# Patient Record
Sex: Male | Born: 2000 | Race: White | Hispanic: No | Marital: Single
Health system: Southern US, Community
[De-identification: ages and names within clinical notes are randomized; demographics above are authoritative.]

## PROBLEM LIST (undated history)

## (undated) ENCOUNTER — Ambulatory Visit (HOSPITAL_COMMUNITY): Admission: EM | Payer: BC Managed Care – PPO

## (undated) DIAGNOSIS — I639 Cerebral infarction, unspecified: Secondary | ICD-10-CM

## (undated) DIAGNOSIS — S069XAA Unspecified intracranial injury with loss of consciousness status unknown, initial encounter: Secondary | ICD-10-CM

## (undated) HISTORY — PX: EYE SURGERY: SHX253

---

## 2020-08-26 ENCOUNTER — Encounter (HOSPITAL_COMMUNITY): Payer: Self-pay

## 2020-08-26 ENCOUNTER — Other Ambulatory Visit: Payer: Self-pay

## 2020-08-26 ENCOUNTER — Emergency Department (HOSPITAL_COMMUNITY)
Admission: EM | Admit: 2020-08-26 | Discharge: 2020-08-26 | Disposition: A | Discharge status: Home / Self Care | Payer: BC Managed Care – PPO | Attending: Emergency Medicine | Admitting: Emergency Medicine

## 2020-08-26 DIAGNOSIS — R55 Syncope and collapse: Secondary | ICD-10-CM

## 2020-08-26 DIAGNOSIS — Y92002 Bathroom of unspecified non-institutional (private) residence single-family (private) house as the place of occurrence of the external cause: Secondary | ICD-10-CM | POA: Diagnosis not present

## 2020-08-26 DIAGNOSIS — F419 Anxiety disorder, unspecified: Secondary | ICD-10-CM | POA: Diagnosis not present

## 2020-08-26 DIAGNOSIS — W0110XA Fall on same level from slipping, tripping and stumbling with subsequent striking against unspecified object, initial encounter: Secondary | ICD-10-CM | POA: Diagnosis not present

## 2020-08-26 DIAGNOSIS — S0990XA Unspecified injury of head, initial encounter: Secondary | ICD-10-CM | POA: Insufficient documentation

## 2020-08-26 LAB — URINALYSIS, ROUTINE W REFLEX MICROSCOPIC
Bacteria, UA: NONE SEEN
Bilirubin Urine: NEGATIVE
Glucose, UA: NEGATIVE mg/dL
Hgb urine dipstick: NEGATIVE
Ketones, ur: NEGATIVE mg/dL
Leukocytes,Ua: NEGATIVE
Nitrite: NEGATIVE
Protein, ur: 30 mg/dL — AB
Specific Gravity, Urine: 1.026 (ref 1.005–1.030)
pH: 5 (ref 5.0–8.0)

## 2020-08-26 LAB — CBC WITH DIFFERENTIAL/PLATELET
Abs Immature Granulocytes: 0.03 10*3/uL (ref 0.00–0.07)
Basophils Absolute: 0 10*3/uL (ref 0.0–0.1)
Basophils Relative: 0 %
Eosinophils Absolute: 0.1 10*3/uL (ref 0.0–0.5)
Eosinophils Relative: 1 %
HCT: 46.6 % (ref 39.0–52.0)
Hemoglobin: 15.5 g/dL (ref 13.0–17.0)
Immature Granulocytes: 0 %
Lymphocytes Relative: 17 %
Lymphs Abs: 1.9 10*3/uL (ref 0.7–4.0)
MCH: 30 pg (ref 26.0–34.0)
MCHC: 33.3 g/dL (ref 30.0–36.0)
MCV: 90.3 fL (ref 80.0–100.0)
Monocytes Absolute: 1.2 10*3/uL — ABNORMAL HIGH (ref 0.1–1.0)
Monocytes Relative: 11 %
Neutro Abs: 8 10*3/uL — ABNORMAL HIGH (ref 1.7–7.7)
Neutrophils Relative %: 71 %
Platelets: 226 10*3/uL (ref 150–400)
RBC: 5.16 MIL/uL (ref 4.22–5.81)
RDW: 12.4 % (ref 11.5–15.5)
WBC: 11.2 10*3/uL — ABNORMAL HIGH (ref 4.0–10.5)
nRBC: 0 % (ref 0.0–0.2)

## 2020-08-26 LAB — COMPREHENSIVE METABOLIC PANEL
ALT: 15 U/L (ref 0–44)
AST: 22 U/L (ref 15–41)
Albumin: 4.4 g/dL (ref 3.5–5.0)
Alkaline Phosphatase: 92 U/L (ref 38–126)
Anion gap: 10 (ref 5–15)
BUN: 16 mg/dL (ref 6–20)
CO2: 26 mmol/L (ref 22–32)
Calcium: 9.4 mg/dL (ref 8.9–10.3)
Chloride: 102 mmol/L (ref 98–111)
Creatinine, Ser: 1.09 mg/dL (ref 0.61–1.24)
GFR, Estimated: >60 mL/min (ref >60)
Glucose, Bld: 108 mg/dL — ABNORMAL HIGH (ref 70–99)
Potassium: 4.3 mmol/L (ref 3.5–5.1)
Sodium: 138 mmol/L (ref 135–145)
Total Bilirubin: 0.8 mg/dL (ref 0.3–1.2)
Total Protein: 7.9 g/dL (ref 6.5–8.1)

## 2020-08-26 LAB — CBG MONITORING, ED: Glucose-Capillary: 101 mg/dL — ABNORMAL HIGH (ref 70–99)

## 2020-08-26 NOTE — ED Provider Notes (Signed)
[facility]-EMERGENCY DEPT Provider Note   CSN: [id number] Arrival date & time: 08/26/20  0054     History No chief complaint on file.   Jason [name] is a 20 y.o. male with no significant past medical history who presents the emergency department by EMS with a chief complaint of syncope.  The patient reports that he found out earlier today that his girlfriend was treated for an STD several years ago.  After having intercourse tonight, the patient went to the restroom to void and felt some burning with urination.  He then strained to try and have a bowel movement when he had a syncopal episode while he was on the toilet.  He states that he woke up on the floor and believes that he hit his head on an unknown object as he is having pain into the top of his head.  He denies dizziness, lightheadedness, nausea, vomiting, diarrhea, abdominal pain, neck pain, chest pain, shortness of breath, numbness, weakness, slurred speech.  No history of similar.  He reports that the episode occurred earlier today.  He took some ibuprofen for a headache after the episode and reports that the headache is since resolved.  He reports that he has been feeling very nervous since he found out about his partners previous history of an STD on a.  He is requesting to be checked for STDs today since he did have some burning with urination earlier today.  He denies penile or testicular pain or swelling, penile discharge, flank pain, groin pain or swelling, back pain, or abdominal pain.  He denies alcohol use or illicit or recreational substance use.  No family history of sudden cardiac death.  Patient has no chronic medical conditions.  No daily medications.  He reports good p.o. intake today.  The history is provided by the patient and medical records. No language interpreter was used.       History reviewed. No pertinent past medical history.  There are no problems to display for this  patient.   History reviewed. No pertinent surgical history.     No family history on file.  Social History   Tobacco Use  . Smoking status: Never Smoker  . Smokeless tobacco: Never Used  Substance Use Topics  . Alcohol use: Never  . Drug use: Never    Home Medications Prior to Admission medications   Not on File    Allergies    Patient has no known allergies.  Review of Systems   Review of Systems  Constitutional: Negative for appetite change and fever.  HENT: Negative for congestion and sore throat.   Respiratory: Negative for shortness of breath and wheezing.   Cardiovascular: Negative for chest pain, palpitations and leg swelling.  Gastrointestinal: Negative for abdominal pain, constipation, diarrhea, nausea and vomiting.  Genitourinary: Positive for dysuria. Negative for hematuria and urgency.  Musculoskeletal: Negative for back pain, myalgias, neck pain and neck stiffness.  Skin: Negative for rash and wound.  Allergic/Immunologic: Negative for immunocompromised state.  Neurological: Positive for syncope and headaches. Negative for seizures, weakness, light-headedness and numbness.  Psychiatric/Behavioral: Negative for confusion.    Physical Exam Updated Vital Signs BP 118/65 (BP Location: Left Arm)   Pulse (!) 53   Temp 98.6 F (37 C) (Oral)   Resp 20   Ht 5\' 11"  (1.803 m)   Wt 83.9 kg   SpO2 96%   BMI 25.80 kg/m   Physical Exam Vitals and nursing note reviewed.  Constitutional:  General: He is not in acute distress.    Appearance: He is well-developed. He is not ill-appearing, toxic-appearing or diaphoretic.  HENT:     Head: Normocephalic.     Comments: No facial tenderness.  Minimal tenderness to palpation to the superior scalp.  No hematomas, lacerations, or superficial abrasions.    Nose: Nose normal.  Eyes:     Extraocular Movements: Extraocular movements intact.     Conjunctiva/sclera: Conjunctivae normal.     Pupils: Pupils are equal,  round, and reactive to light.  Cardiovascular:     Rate and Rhythm: Normal rate and regular rhythm.     Heart sounds: No murmur heard. No friction rub. No gallop.   Pulmonary:     Effort: Pulmonary effort is normal. No respiratory distress.     Breath sounds: No stridor. No wheezing, rhonchi or rales.  Chest:     Chest wall: No tenderness.  Abdominal:     General: There is no distension.     Palpations: Abdomen is soft. There is no mass.     Tenderness: There is no abdominal tenderness. There is no right CVA tenderness, left CVA tenderness, guarding or rebound.     Hernia: No hernia is present.  Musculoskeletal:     Cervical back: Neck supple.  Skin:    General: Skin is warm and dry.     Capillary Refill: Capillary refill takes less than 2 seconds.     Coloration: Skin is not pale.  Neurological:     Mental Status: He is alert.     Comments: Mental Status: Patient is awake, alert, oriented to person, place, month, year, and situation. Patient is able to give a clear and coherent history. Cranial Nerves: II: Visual Fields are full. Pupils are equal, round, and reactive to light. III,IV, VI: EOMI without ptosis or diploplia.  V: Facial sensation is symmetric to temperature VII: Facial movement is symmetric.  VIII: hearing is intact to voice X: Uvula elevates symmetrically XI: Shoulder shrug is symmetric. XII: tongue is midline without atrophy or fasciculations.  Motor: Tone is normal. Bulk is normal. 5/5 strength was present in all four extremities.  Sensory: Sensation is symmetric to light touch and temperature in the arms and legs. Cerebellar: FNF intact bilaterally   Psychiatric:        Mood and Affect: Mood is anxious.        Behavior: Behavior normal.     Comments: Patient is very anxious appearing     ED Results / Procedures / Treatments   Labs (all labs ordered are listed, but only abnormal results are displayed) Labs Reviewed  CBC WITH DIFFERENTIAL/PLATELET -  Abnormal; Notable for the following components:      Result Value   WBC 11.2 (*)    Neutro Abs 8.0 (*)    Monocytes Absolute 1.2 (*)    All other components within normal limits  COMPREHENSIVE METABOLIC PANEL - Abnormal; Notable for the following components:   Glucose, Bld 108 (*)    All other components within normal limits  URINALYSIS, ROUTINE W REFLEX MICROSCOPIC - Abnormal; Notable for the following components:   Protein, ur 30 (*)    All other components within normal limits  CBG MONITORING, ED - Abnormal; Notable for the following components:   Glucose-Capillary 101 (*)    All other components within normal limits  GC/CHLAMYDIA PROBE AMP ([place]) NOT AT [facility]    EKG EKG Interpretation  Date/Time:  Monday August 26 2020 01:38:42 EST  Ventricular Rate:  68 PR Interval:    QRS Duration: 72 QT Interval:  355 QTC Calculation: 378 R Axis:   43 Text Interpretation: Sinus rhythm Borderline T wave abnormalities Confirmed by Nicanor Alcon, April ([id number]) on 08/26/2020 1:44:46 AM   Radiology No results found.  Procedures Procedures   Medications Ordered in ED Medications - No data to display  ED Course  I have reviewed the triage vital signs and the nursing notes.  Pertinent labs & imaging results that were available during my care of the patient were reviewed by me and considered in my medical decision making (see chart for details).    MDM Rules/Calculators/A&P                          20 year old male who presents the emergency department after a syncopal episode while he was on the toilet earlier tonight.  He hit his head on an unknown object.  Notably, patient was very anxious prior to the episode as he developed some dysuria that began after having intercourse with his significant other when he found out earlier that she previously was treated for an STD several years ago.   Differential diagnosis includes seizure-like activity, orthostatic hypotension, vasovagal  syncope, cardiac syncope, neurologic syncope, ACS.  The patient was discussed with Dr. Nicanor Alcon, attending physician.  No focal neurologic deficits.  Patient has now been observed for more than 4 hours since the incident and mental status remains intact.  He has no obvious wounds to the scalp and headache resolved after ibuprofen prior to arrival.  CT head is not indicated at this time.  EKG with sinus rhythm.  No metabolic derangements.  UA is not concerning for infection.  No indication for prophylactic STI testing at this time.  Advised patient that symptoms are most consistent with vasovagal syncope.  Recommended outpatient follow-up as needed.  ER return precautions given.  Patient was advised that he could receive routine STI testing as he go for [facility] department, but urine was not concerning for infection today.  He is hemodynamically stable no acute distress.  Safe for discharge home with outpatient follow-up as needed.   Final Clinical Impression(s) / ED Diagnoses Final diagnoses:  Vasovagal syncope  Injury of head, initial encounter    Rx / DC Orders ED Discharge Orders    None       Barkley Boards, PA-C 08/26/20 0854    Palumbo, April, MD 08/26/20 2305

## 2020-08-26 NOTE — ED Triage Notes (Signed)
Pt sts a syncopal episode tonight while in the bathroom. Pt sts he fell landing on the back of his head and waking up on floor. Denies any medical history or allergies. Ambulatory from triage to room.

## 2020-08-26 NOTE — Discharge Instructions (Signed)
Thank you for allowing me to care for you today in the Emergency Department.   Your work-up was reassuring today.  Your urine was not concerning for infection.  Your electrolytes were reassuring.  No evidence of urinary tract infection.  Your symptoms today were consistent with vasovagal syncope.  You can follow-up with primary care as needed.  If you do not have a primary care provider, you can call the number on your discharge paperwork to get established with 1.  Take 650 mg of Tylenol or 600 mg of ibuprofen with food every 6 hours for pain.  You can alternate between these 2 medications every 3 hours if your pain returns.  For instance, you can take Tylenol at noon, followed by a dose of ibuprofen at 3, followed by second dose of Tylenol and 6.  Return the emergency department if you have another syncopal episode, if you develop uncontrollable vomiting, new numbness or weakness, fever, unable to walk, start having high fevers, severe abdominal pain, fever, unable to urinate, or have other new, concerning symptoms.

## 2020-11-22 ENCOUNTER — Telehealth: Payer: Self-pay

## 2020-11-22 NOTE — Telephone Encounter (Signed)
NOTES ON FILE FROM [facility] [phone], REFERRAL SENT TO SCHEDULING

## 2020-11-22 NOTE — Telephone Encounter (Signed)
ERROR

## 2020-12-11 ENCOUNTER — Ambulatory Visit (HOSPITAL_COMMUNITY)
Admission: EM | Admit: 2020-12-11 | Discharge: 2020-12-11 | Disposition: A | Discharge status: Home / Self Care | Payer: BC Managed Care – PPO | Attending: Family Medicine | Admitting: Family Medicine

## 2020-12-11 ENCOUNTER — Encounter (HOSPITAL_COMMUNITY): Payer: Self-pay

## 2020-12-11 ENCOUNTER — Other Ambulatory Visit: Payer: Self-pay

## 2020-12-11 DIAGNOSIS — R21 Rash and other nonspecific skin eruption: Secondary | ICD-10-CM | POA: Diagnosis not present

## 2020-12-11 MED ORDER — VALACYCLOVIR HCL 1 G PO TABS: 1000.0000 mg | ORAL_TABLET | Freq: Three times a day (TID) | ORAL | 0 refills | Status: DC

## 2020-12-11 NOTE — ED Triage Notes (Signed)
Pt reports woke up with a few itchy bumps on penis yesterday, and bumps are now more numerous. Pt reports some swelling as well.

## 2020-12-11 NOTE — ED Provider Notes (Signed)
  [facility]ARE [facility]   [id number] 12/11/20 Arrival Time: 1458  ASSESSMENT & PLAN:  1. Penile rash    Discussed suspicion for genital herpes. Too early to send for culture. Declines other STD testing.  Begin: Meds ordered this encounter  Medications  . valACYclovir (VALTREX) 1000 MG tablet    Sig: Take 1 tablet (1,000 mg total) by mouth 3 (three) times daily.    Dispense:  21 tablet    Refill:  0     Follow-up Information    Ann Maki, MD.   Specialty: Family Medicine Why: As needed. Contact information: [address] [place] [address] [phone]              Reviewed expectations re: course of current medical issues. Questions answered. Outlined signs and symptoms indicating need for more acute intervention. Patient verbalized understanding. After Visit Summary given.   SUBJECTIVE:  Jason [name] is a 20 y.o. male who presents with complaint of "bumps on penis"; noted over past two days; with pain/discomfort/itching. No h/o similar. Sexually active with one male partner. Normal urination. No penile discharge.   OBJECTIVE:  Vitals:   12/11/20 1509  BP: 120/73  Pulse: 74  Resp: 18  Temp: 98.9 F (37.2 C)  TempSrc: Oral  SpO2: 98%    General appearance: alert, cooperative, appears stated age and no distress Throat: lips, mucosa, and tongue normal; teeth and gums normal Lungs: unlabored respirations; speaks full sentences without difficulty Back: no CVA tenderness; FROM at waist Abdomen: soft, non-tender GU: cluster of vesicles on distal penis at glans without ulceration; tender to touch Skin: warm and dry Psychological: alert and cooperative; normal mood and affect.  No Known Allergies  History reviewed. No pertinent past medical history. Family History  Problem Relation Age of Onset  . Healthy Mother   . Healthy Father    Social History   Socioeconomic History  . Marital status: Single    Spouse name:  Not on file  . Number of children: Not on file  . Years of education: Not on file  . Highest education level: Not on file  Occupational History  . Not on file  Tobacco Use  . Smoking status: Never Smoker  . Smokeless tobacco: Never Used  Substance and Sexual Activity  . Alcohol use: Never  . Drug use: Never  . Sexual activity: Not on file  Other Topics Concern  . Not on file  Social History Narrative  . Not on file   Social Determinants of Health   Financial Resource Strain: Not on file  Food Insecurity: Not on file  Transportation Needs: Not on file  Physical Activity: Not on file  Stress: Not on file  Social Connections: Not on file  Intimate Partner Violence: Not on file          Mardella Layman, MD 12/11/20 1630

## 2020-12-17 ENCOUNTER — Telehealth: Payer: Self-pay

## 2020-12-17 NOTE — Telephone Encounter (Signed)
Progress notes on file sent from: [facility], phone: [phone]. Copy of referral order have been sent to scheduling.

## 2021-02-03 ENCOUNTER — Telehealth: Payer: Self-pay

## 2021-02-03 NOTE — Telephone Encounter (Signed)
Referral notes sent from [facility], Phone #: [phone], Fax #: [phone]   Notes sent to scheduling

## 2021-03-03 ENCOUNTER — Other Ambulatory Visit: Payer: Self-pay

## 2021-03-03 ENCOUNTER — Encounter (HOSPITAL_COMMUNITY): Payer: Self-pay

## 2021-03-03 ENCOUNTER — Ambulatory Visit (HOSPITAL_COMMUNITY): Payer: BC Managed Care – PPO

## 2021-03-03 ENCOUNTER — Ambulatory Visit (HOSPITAL_COMMUNITY)
Admission: EM | Admit: 2021-03-03 | Discharge: 2021-03-03 | Disposition: A | Discharge status: Home / Self Care | Payer: BC Managed Care – PPO | Attending: Physician Assistant | Admitting: Physician Assistant

## 2021-03-03 ENCOUNTER — Ambulatory Visit (INDEPENDENT_AMBULATORY_CARE_PROVIDER_SITE_OTHER): Payer: BC Managed Care – PPO

## 2021-03-03 DIAGNOSIS — R079 Chest pain, unspecified: Secondary | ICD-10-CM | POA: Diagnosis not present

## 2021-03-03 DIAGNOSIS — R0789 Other chest pain: Secondary | ICD-10-CM | POA: Diagnosis not present

## 2021-03-03 IMAGING — DX DG CHEST 2V
3 series · 3 of 3 positions shown · non-contrast
Comparison: None.

CLINICAL DATA: Chest pain.

EXAM:
CHEST - 2 VIEW

[chest pa (1 of 2)]
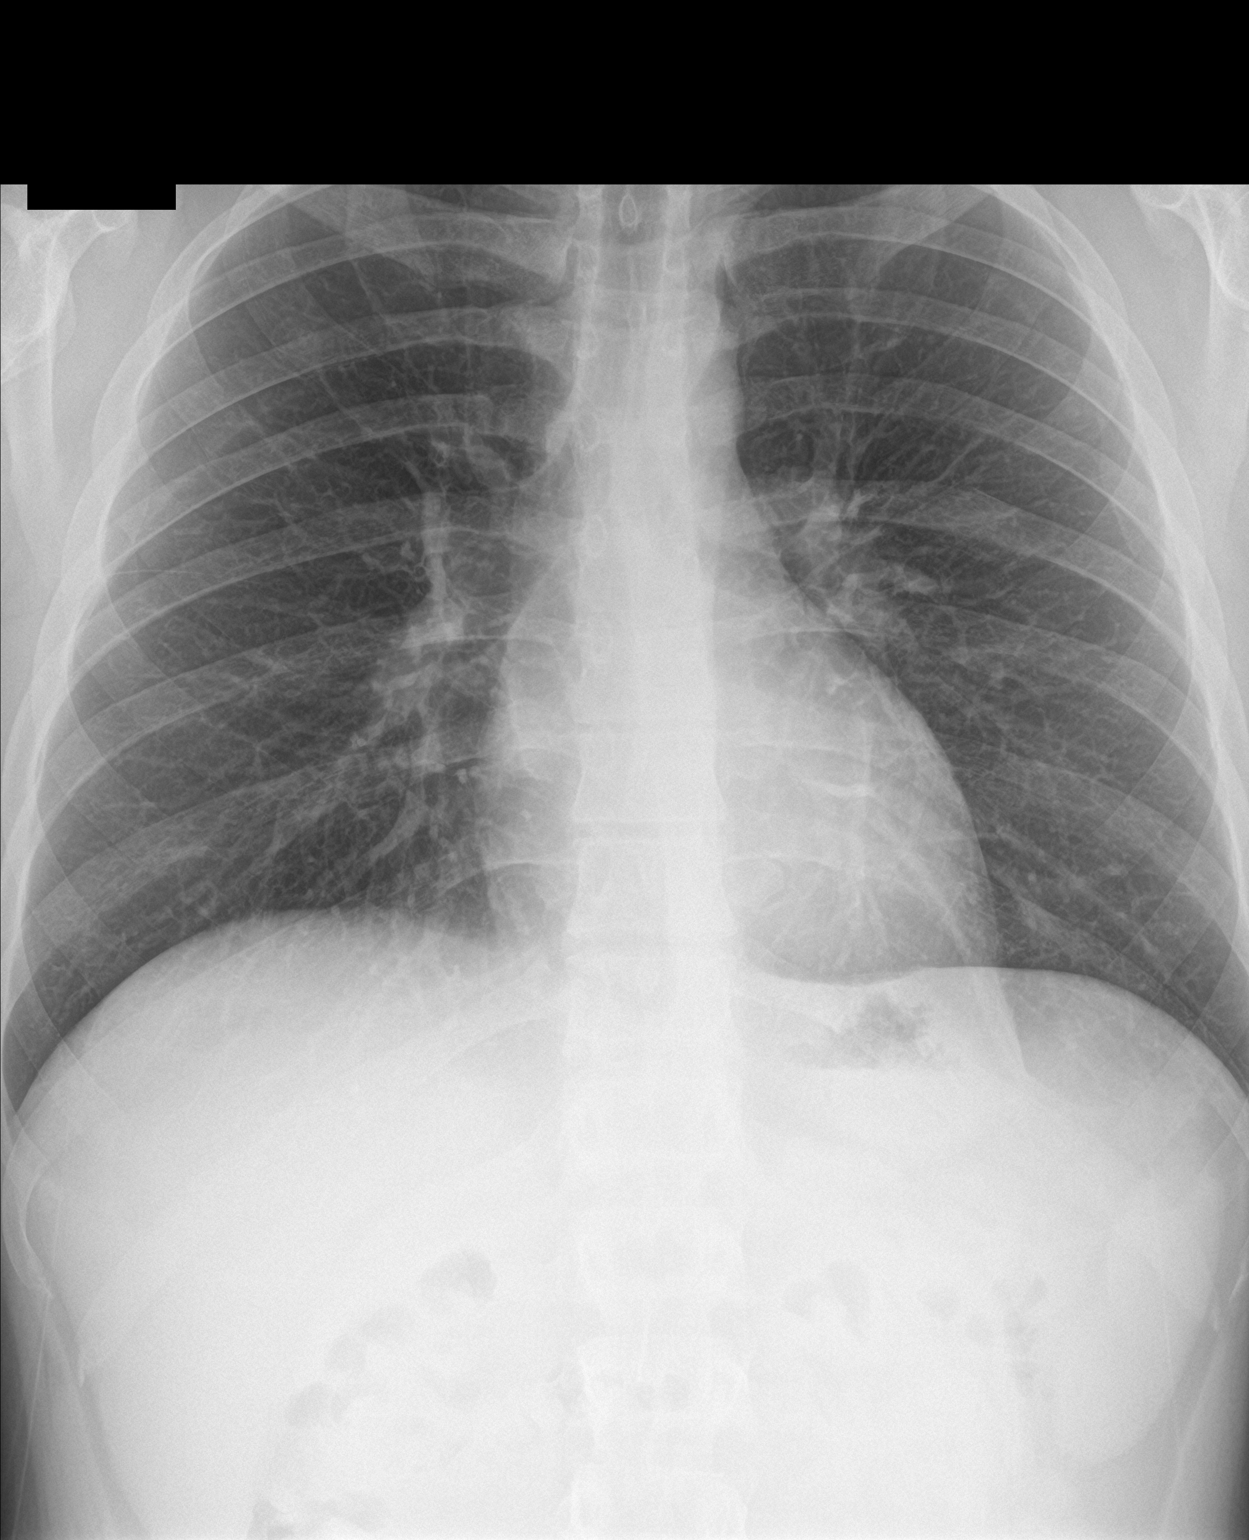

[chest lat]
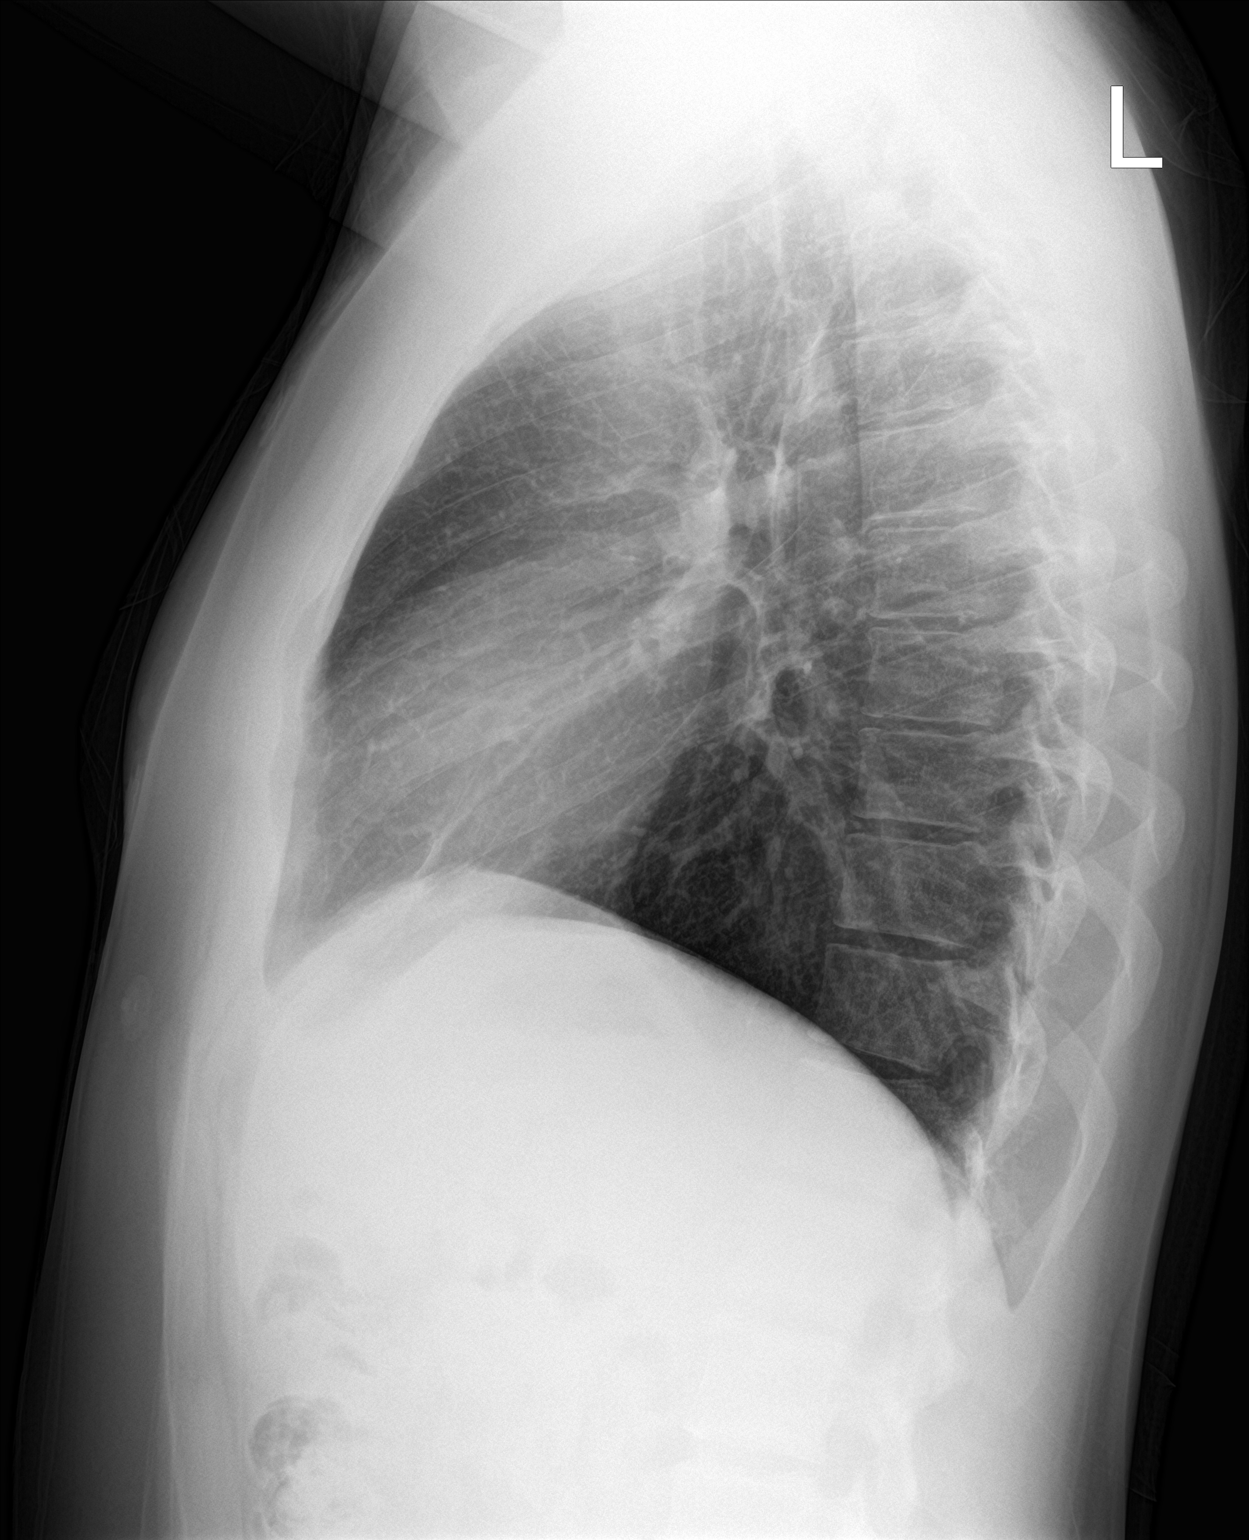

[chest pa (2 of 2)]
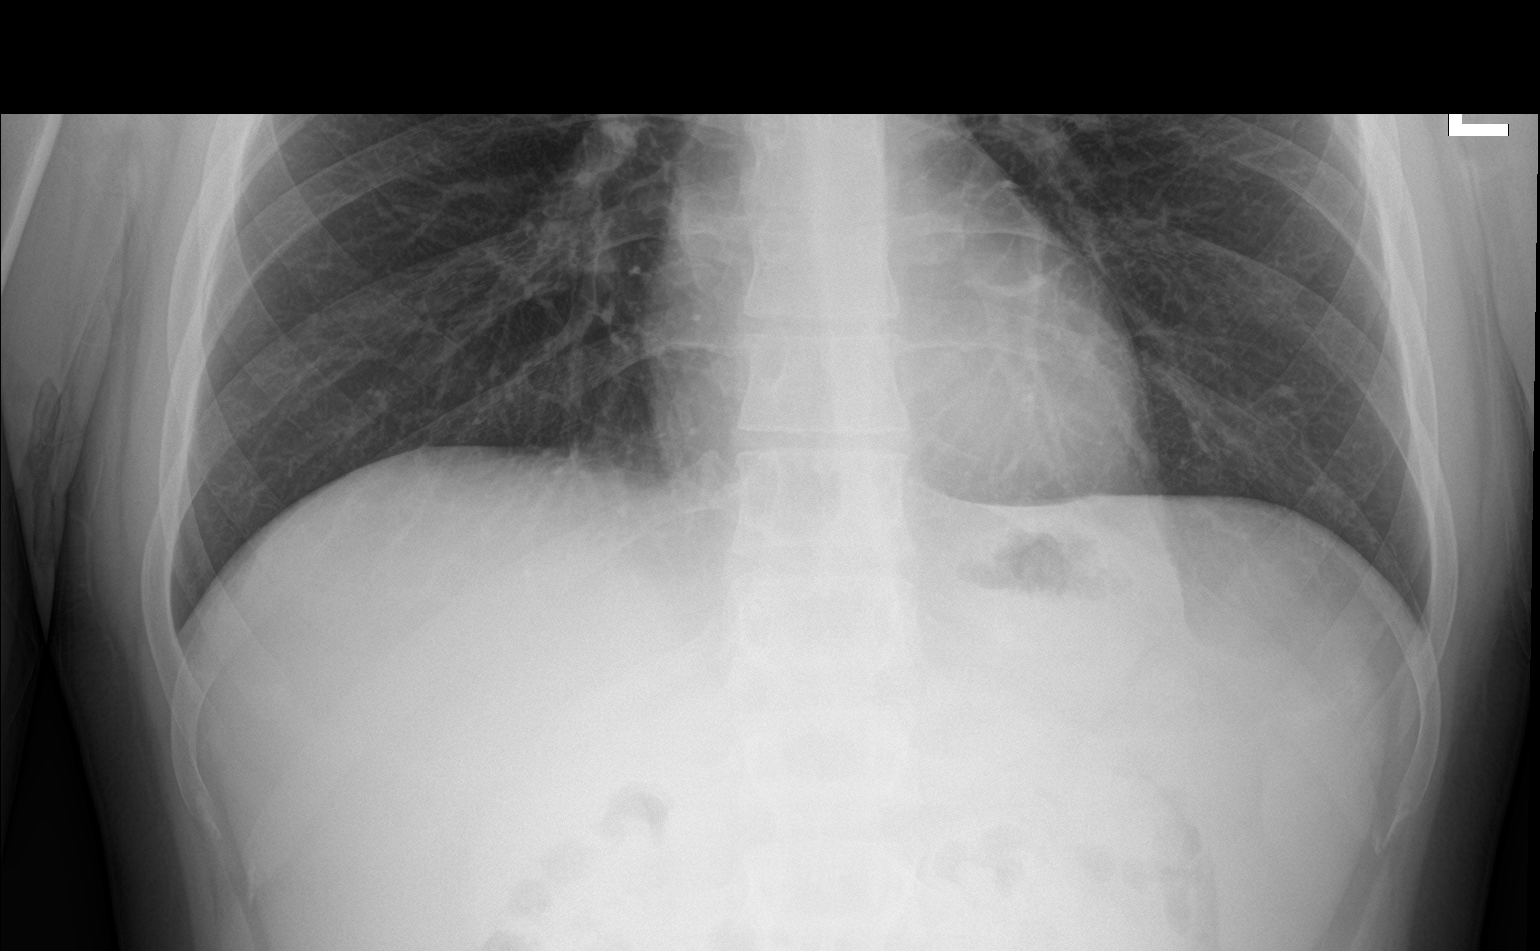

[3 of 3 positions shown; findings below may reference images not displayed]

FINDINGS: The heart size and mediastinal contours are within normal limits.
Both lungs are clear. The visualized skeletal structures are
unremarkable.
IMPRESSION: No acute cardiopulmonary process.

## 2021-03-03 MED ORDER — PREDNISONE 20 MG PO TABS: 40.0000 mg | ORAL_TABLET | Freq: Every day | ORAL | 0 refills | Status: AC

## 2021-03-03 MED ORDER — TIZANIDINE HCL 4 MG PO CAPS: 4.0000 mg | ORAL_CAPSULE | Freq: Every evening | ORAL | 0 refills | Status: DC | PRN

## 2021-03-03 NOTE — ED Provider Notes (Signed)
[facility]    CSN: [id number] Arrival date & time: 03/03/21  1533      History   Chief Complaint Chief Complaint  Patient presents with   Chest Pain    HPI Jason [name] is a 20 y.o. male.   Patient presents today with a several hour history of chest pain.  Reports that he woke up this afternoon and had severe chest pain.  This is gradually been improving but continues to be intermittent without identifiable trigger.  He denies any change in intensity pain with position.  Reports pain is rated 1/2 on a 0-10 pain scale, localized to substernal region with radiation into right anterior chest, described as sharp, worse with movement, no alleviating factors identified.  He denies any change in activity or injury prior to symptom onset.  He has not tried any over-the-counter medications.  Denies any shortness of breath, lightheadedness, dizziness, nausea, vomiting, diaphoresis.  He denies any significant past medical history including diabetes, hypertension, hyperlipidemia, personal or family history of cardiovascular disease.  He does vape on a regular basis but denies tobacco smoke.   History reviewed. No pertinent past medical history.  There are no problems to display for this patient.   History reviewed. No pertinent surgical history.     Home Medications    Prior to Admission medications   Medication Sig Start Date End Date Taking? Authorizing Provider  predniSONE (DELTASONE) 20 MG tablet Take 2 tablets (40 mg total) by mouth daily for 4 days. 03/03/21 03/07/21 Yes Krystalyn Kubota K, PA-C  tiZANidine (ZANAFLEX) 4 MG capsule Take 1 capsule (4 mg total) by mouth at bedtime as needed for muscle spasms. 03/03/21  Yes Nova Schmuhl, Noberto Retort, PA-C  valACYclovir (VALTREX) 1000 MG tablet Take 1 tablet (1,000 mg total) by mouth 3 (three) times daily. 12/11/20   Mardella Layman, MD    Family History Family History  Problem Relation Age of Onset   Healthy Mother    Healthy Father      Social History Social History   Tobacco Use   Smoking status: Never   Smokeless tobacco: Never  Vaping Use   Vaping Use: Every day  Substance Use Topics   Alcohol use: Never   Drug use: Never     Allergies   Patient has no known allergies.   Review of Systems Review of Systems  Constitutional:  Positive for activity change. Negative for appetite change, fatigue and fever.  Respiratory:  Negative for cough and shortness of breath.   Cardiovascular:  Positive for chest pain. Negative for palpitations and leg swelling.  Gastrointestinal:  Negative for abdominal pain, diarrhea, nausea and vomiting.  Neurological:  Negative for dizziness, light-headedness and headaches.    Physical Exam Triage Vital Signs ED Triage Vitals  Enc Vitals Group     BP 03/03/21 1559 (!) 129/116     Pulse Rate 03/03/21 1559 61     Resp 03/03/21 1607 19     Temp 03/03/21 1607 99 F (37.2 C)     Temp Source 03/03/21 1607 Oral     SpO2 03/03/21 1559 99 %     Weight --      Height --      Head Circumference --      Peak Flow --      Pain Score 03/03/21 1559 1     Pain Loc --      Pain Edu? --      Excl. in GC? --  No data found.  Updated Vital Signs BP 110/76 (BP Location: Right Arm)   Pulse 61   Temp 99 F (37.2 C) (Oral)   Resp 19   SpO2 99%   Visual Acuity Right Eye Distance:   Left Eye Distance:   Bilateral Distance:    Right Eye Near:   Left Eye Near:    Bilateral Near:     Physical Exam Vitals reviewed.  Constitutional:      General: He is awake.     Appearance: Normal appearance. He is normal weight. He is not ill-appearing.     Comments: Very pleasant male appears stated age in no acute distress sitting comfortably in exam room  HENT:     Head: Normocephalic and atraumatic.  Cardiovascular:     Rate and Rhythm: Normal rate and regular rhythm.     Heart sounds: Normal heart sounds, S1 normal and S2 normal. No murmur heard. Pulmonary:     Effort: Pulmonary  effort is normal.     Breath sounds: Normal breath sounds. No stridor. No wheezing, rhonchi or rales.     Comments: Clear to auscultation bilaterally Chest:     Chest wall: Tenderness present. No deformity or swelling.     Comments: Pain reproducible on exam.  Tenderness to palpation over sternum and right anterior rib cage. Abdominal:     General: Bowel sounds are normal.     Palpations: Abdomen is soft.     Tenderness: There is no abdominal tenderness.  Neurological:     Mental Status: He is alert.  Psychiatric:        Behavior: Behavior is cooperative.     UC Treatments / Results  Labs (all labs ordered are listed, but only abnormal results are displayed) Labs Reviewed - No data to display  EKG   Radiology DG Chest 2 View  Result Date: 03/03/2021 CLINICAL DATA:  Chest pain. EXAM: CHEST - 2 VIEW COMPARISON:  None. FINDINGS: The heart size and mediastinal contours are within normal limits. Both lungs are clear. The visualized skeletal structures are unremarkable. IMPRESSION: No acute cardiopulmonary process. Electronically Signed   By: Genevive Bi M.D.   On: 03/03/2021 16:51    Procedures Procedures (including critical care time)  Medications Ordered in UC Medications - No data to display  Initial Impression / Assessment and Plan / UC Course  I have reviewed the triage vital signs and the nursing notes.  Pertinent labs & imaging results that were available during my care of the patient were reviewed by me and considered in my medical decision making (see chart for details).      EKG obtained showed normal sinus rhythm with first-degree AV block with ventricular rate of 65 bpm and there are with progression compared to 08/26/2020 tracing; no ischemic changes.  Chest x-ray obtained showed no acute abnormalities.  Discussed symptoms are likely musculoskeletal in etiology given tenderness on exam.  Patient was started on prednisone burst (40 mg for 4 days) with  instruction not to take NSAIDs.  Was prescribed Zanaflex to be used at night with instruction not to drive or drink alcohol taking this medication as drowsiness is a common side effect.  He can use Tylenol for breakthrough pain.  Discussed that if he has any persistent or worsening symptoms he needs to go directly to the emergency room to which he expressed understanding.  Strict return precautions given to which he expressed understanding.  Final Clinical Impressions(s) / UC Diagnoses   Final diagnoses:  Chest wall pain     Discharge Instructions      Your EKG and chest x-ray were essentially normal.  I believe your symptoms are related to muscle strain given you are tender when touched your chest.  Start prednisone 40 mg in the morning for 4 days.  Should not take NSAIDs including aspirin, ibuprofen/Advil, naproxen/Aleve with this medication as it will cause stomach bleeding.  You can use Zanaflex at night.  This will make you sleepy so do not drive or drink alcohol while taking it.  You can use Tylenol for breakthrough pain.  If you have any worsening symptoms or symptoms or not improving as we discussed you need to go directly to the emergency room for further evaluation and management.     ED Prescriptions     Medication Sig Dispense Auth. Provider   predniSONE (DELTASONE) 20 MG tablet Take 2 tablets (40 mg total) by mouth daily for 4 days. 8 tablet Saniyya Gau K, PA-C   tiZANidine (ZANAFLEX) 4 MG capsule Take 1 capsule (4 mg total) by mouth at bedtime as needed for muscle spasms. 10 capsule Terrick Allred, Noberto Retort, PA-C      PDMP not reviewed this encounter.   Jeani Hawking, PA-C 03/03/21 1712

## 2021-03-03 NOTE — Discharge Instructions (Addendum)
Your EKG and chest x-ray were essentially normal.  I believe your symptoms are related to muscle strain given you are tender when touched your chest.  Start prednisone 40 mg in the morning for 4 days.  Should not take NSAIDs including aspirin, ibuprofen/Advil, naproxen/Aleve with this medication as it will cause stomach bleeding.  You can use Zanaflex at night.  This will make you sleepy so do not drive or drink alcohol while taking it.  You can use Tylenol for breakthrough pain.  If you have any worsening symptoms or symptoms or not improving as we discussed you need to go directly to the emergency room for further evaluation and management.

## 2021-03-03 NOTE — ED Triage Notes (Addendum)
Pt in with c/o mid to right sided cp that started when he woke up this afternoon. Pt states he feels cloudy in his head and has been dealing with anxiety and panic attacks lately. Pt states he has been dealing with chest wall pain x 3 weeks now  Pt Denies any radiation to arm or jaw but does have radiation to his back  Pt denies any sob, or nausea or cardiac hx

## 2021-04-24 ENCOUNTER — Ambulatory Visit (HOSPITAL_COMMUNITY)
Admission: EM | Admit: 2021-04-24 | Discharge: 2021-04-24 | Disposition: A | Discharge status: Home / Self Care | Payer: BC Managed Care – PPO | Attending: Emergency Medicine | Admitting: Emergency Medicine

## 2021-04-24 ENCOUNTER — Other Ambulatory Visit: Payer: Self-pay

## 2021-04-24 ENCOUNTER — Encounter (HOSPITAL_COMMUNITY): Payer: Self-pay | Admitting: Emergency Medicine

## 2021-04-24 DIAGNOSIS — J029 Acute pharyngitis, unspecified: Secondary | ICD-10-CM

## 2021-04-24 LAB — POCT RAPID STREP A, ED / UC: Streptococcus, Group A Screen (Direct): NEGATIVE

## 2021-04-24 MED ORDER — VALACYCLOVIR HCL 1 G PO TABS: 1000.0000 mg | ORAL_TABLET | Freq: Three times a day (TID) | ORAL | 1 refills | Status: DC

## 2021-04-24 MED ORDER — LIDOCAINE VISCOUS HCL 2 % MT SOLN
15.0000 mL | OROMUCOSAL | 0 refills | Status: DC | PRN
Start: 2021-04-24 — End: 2023-05-12

## 2021-04-24 NOTE — Discharge Instructions (Addendum)
Your strep test is pending, you will be notified if positive, if positive antibiotics will be sent into the pharmacy  If your testing is negative today then your symptoms are most likely related to a virus and should improve with time, most likely you have been exposed to multiple viruses from different people which is why your symptoms seem to be prolonged  I have prescribed for you a lidocaine solution that you may use every 4 hours as needed for additional comfort, gargle then spit  You may continue use of over-the-counter Tylenol or ibuprofen for additional comfort  You may attempt use of throat lozenges, warm liquids and salt water gargles for additional comfort

## 2021-04-24 NOTE — ED Triage Notes (Signed)
Pt presents with sore throat, productive cough, and congestion xs 7 days.

## 2021-04-24 NOTE — ED Provider Notes (Signed)
[facility]    CSN: [id number] Arrival date & time: 04/24/21  1315      History   Chief Complaint Chief Complaint  Patient presents with   Cough   Sore Throat   Nasal Congestion    HPI Jason [name] is a 20 y.o. male.   Patient presents with sore throat, nasal congestion, productive cough, rhinorrhea for 7 days, endorses that sore throat worsened overnight and became unbearable to swallow and eat this morning.  Was tolerating food and liquids.  Taking over-the-counter medication which did provide some relief.  Endorses fever present day 1 through 3 but has resolved.  Multiple sick contacts.  Denies, body aches, headaches, ear pain, chest pain or tightness, shortness of breath, wheezing, abdominal pain, nausea, vomiting, diarrhea.  No pertinent medical history.   Requesting valacyclovir refill.  Having current outbreak.   Cough Associated symptoms: rhinorrhea and sore throat   Associated symptoms: no ear pain, no shortness of breath and no wheezing   Sore Throat Pertinent negatives include no shortness of breath.  Cough Associated symptoms: rhinorrhea   Associated symptoms: no ear pain, no shortness of breath and no wheezing   Sore Throat Pertinent negatives include no shortness of breath.  Cough Associated symptoms: no ear pain, no shortness of breath and no wheezing   Sore Throat Pertinent negatives include no shortness of breath.  Cough Associated symptoms: no shortness of breath and no wheezing   Sore Throat Pertinent negatives include no shortness of breath.  Cough Associated symptoms: no shortness of breath   Sore Throat Pertinent negatives include no shortness of breath.   History reviewed. No pertinent past medical history.  There are no problems to display for this patient.   History reviewed. No pertinent surgical history.     Home Medications    Prior to Admission medications   Medication Sig Start Date End Date Taking? Authorizing  Provider  lidocaine (XYLOCAINE) 2 % solution Use as directed 15 mLs in the mouth or throat every 4 (four) hours as needed for mouth pain. 04/24/21  Yes Earlyn Sylvan R, NP  tiZANidine (ZANAFLEX) 4 MG capsule Take 1 capsule (4 mg total) by mouth at bedtime as needed for muscle spasms. 03/03/21   Raspet, Noberto Retort, PA-C  valACYclovir (VALTREX) 1000 MG tablet Take 1 tablet (1,000 mg total) by mouth 3 (three) times daily. 04/24/21   Valinda Hoar, NP    Family History Family History  Problem Relation Age of Onset   Healthy Mother    Healthy Father     Social History Social History   Tobacco Use   Smoking status: Never   Smokeless tobacco: Never  Vaping Use   Vaping Use: Every day  Substance Use Topics   Alcohol use: Never   Drug use: Never     Allergies   Patient has no known allergies.   Review of Systems Review of Systems  Constitutional: Negative.   HENT:  Positive for congestion, rhinorrhea and sore throat. Negative for dental problem, drooling, ear discharge, ear pain, facial swelling, hearing loss, mouth sores, nosebleeds, postnasal drip, sinus pressure, sinus pain, sneezing, tinnitus, trouble swallowing and voice change.   Respiratory:  Positive for cough. Negative for apnea, choking, chest tightness, shortness of breath, wheezing and stridor.   Skin: Negative.     Physical Exam Triage Vital Signs ED Triage Vitals  Enc Vitals Group     BP 04/24/21 1503 121/75     Pulse Rate 04/24/21 1503 68  Resp 04/24/21 1503 17     Temp 04/24/21 1503 97.8 F (36.6 C)     Temp Source 04/24/21 1503 Oral     SpO2 04/24/21 1503 98 %     Weight --      Height --      Head Circumference --      Peak Flow --      Pain Score 04/24/21 1502 0     Pain Loc --      Pain Edu? --      Excl. in GC? --    No data found.  Updated Vital Signs BP 121/75 (BP Location: Right Arm)   Pulse 68   Temp 97.8 F (36.6 C) (Oral)   Resp 17   SpO2 98%   Visual Acuity Right Eye  Distance:   Left Eye Distance:   Bilateral Distance:    Right Eye Near:   Left Eye Near:    Bilateral Near:     Physical Exam Constitutional:      Appearance: He is well-developed and normal weight.  HENT:     Right Ear: Tympanic membrane and ear canal normal.     Left Ear: Tympanic membrane and ear canal normal.     Nose: Congestion present. No rhinorrhea.     Mouth/Throat:     Mouth: Mucous membranes are moist.     Pharynx: Posterior oropharyngeal erythema present.     Tonsils: No tonsillar exudate or tonsillar abscesses. 2+ on the right. 2+ on the left.  Cardiovascular:     Rate and Rhythm: Normal rate and regular rhythm.     Heart sounds: Normal heart sounds.  Pulmonary:     Effort: Pulmonary effort is normal.     Breath sounds: Normal breath sounds.  Musculoskeletal:     Cervical back: Normal range of motion and neck supple.  Lymphadenopathy:     Cervical: No cervical adenopathy.  Skin:    General: Skin is warm and dry.  Neurological:     Mental Status: He is alert and oriented to person, place, and time.  Psychiatric:        Mood and Affect: Mood normal.        Behavior: Behavior normal.     UC Treatments / Results  Labs (all labs ordered are listed, but only abnormal results are displayed) Labs Reviewed  POCT RAPID STREP A, ED / UC    EKG   Radiology No results found.  Procedures Procedures (including critical care time)  Medications Ordered in UC Medications - No data to display  Initial Impression / Assessment and Plan / UC Course  I have reviewed the triage vital signs and the nursing notes.  Pertinent labs & imaging results that were available during my care of the patient were reviewed by me and considered in my medical decision making (see chart for details).  Sore throat  Discussed etiology of symptoms most likely viral due to multiple exposures, patient to follow-up in urgent care symptoms worsen or continue to persist  1.  Rapid strep  negative, sent for culture 2.  Lidocaine viscous 2% 15 mils every 4 hours as needed 3.  Can continue use of over-the-counter pain medication 4.  Valacyclovir 1000 mg 3 times daily for 7 days prescribed, patient wanting to be on prophylactic outbreak medication, patient to follow-up with PCP for further evaluation and prescription  Final Clinical Impressions(s) / UC Diagnoses   Final diagnoses:  Sore throat     Discharge  Instructions      Your strep test is pending, you will be notified if positive, if positive antibiotics will be sent into the pharmacy  If your testing is negative today then your symptoms are most likely related to a virus and should improve with time, most likely you have been exposed to multiple viruses from different people which is why your symptoms seem to be prolonged  I have prescribed for you a lidocaine solution that you may use every 4 hours as needed for additional comfort, gargle then spit  You may continue use of over-the-counter Tylenol or ibuprofen for additional comfort  You may attempt use of throat lozenges, warm liquids and salt water gargles for additional comfort   ED Prescriptions     Medication Sig Dispense Auth. Provider   valACYclovir (VALTREX) 1000 MG tablet Take 1 tablet (1,000 mg total) by mouth 3 (three) times daily. 21 tablet Sharilyn Geisinger R, NP   lidocaine (XYLOCAINE) 2 % solution Use as directed 15 mLs in the mouth or throat every 4 (four) hours as needed for mouth pain. 100 mL Valinda Hoar, NP      PDMP not reviewed this encounter.   Valinda Hoar, NP 04/24/21 1558

## 2021-05-14 ENCOUNTER — Other Ambulatory Visit: Payer: Self-pay

## 2021-05-14 ENCOUNTER — Encounter (HOSPITAL_COMMUNITY): Payer: Self-pay

## 2021-05-14 ENCOUNTER — Emergency Department (HOSPITAL_COMMUNITY)
Admission: EM | Admit: 2021-05-14 | Discharge: 2021-05-15 | Disposition: A | Discharge status: Home / Self Care | Payer: BC Managed Care – PPO | Attending: Emergency Medicine | Admitting: Emergency Medicine

## 2021-05-14 DIAGNOSIS — J101 Influenza due to other identified influenza virus with other respiratory manifestations: Secondary | ICD-10-CM | POA: Diagnosis not present

## 2021-05-14 DIAGNOSIS — Z28310 Unvaccinated for covid-19: Secondary | ICD-10-CM | POA: Diagnosis not present

## 2021-05-14 DIAGNOSIS — Z20822 Contact with and (suspected) exposure to covid-19: Secondary | ICD-10-CM | POA: Insufficient documentation

## 2021-05-14 DIAGNOSIS — J029 Acute pharyngitis, unspecified: Secondary | ICD-10-CM | POA: Diagnosis present

## 2021-05-14 LAB — RESP PANEL BY RT-PCR (FLU A&B, COVID) ARPGX2
Influenza A by PCR: POSITIVE — AB
Influenza B by PCR: NEGATIVE
SARS Coronavirus 2 by RT PCR: NEGATIVE

## 2021-05-14 NOTE — ED Notes (Signed)
Pt refused strept swab at this time.

## 2021-05-14 NOTE — ED Triage Notes (Signed)
Pt c/o cough, nasal congestion, and sore throat x3 days. Pt states he has been resting and taking advil at home.

## 2021-05-15 MED ORDER — ALBUTEROL SULFATE HFA 108 (90 BASE) MCG/ACT IN AERS
2.0000 | INHALATION_SPRAY | RESPIRATORY_TRACT | Status: DC | PRN
  Administered 2021-05-15: 2 via RESPIRATORY_TRACT
  Filled 2021-05-15: qty 6.7

## 2021-05-15 MED ORDER — AEROCHAMBER Z-STAT PLUS/MEDIUM MISC
1.0000 | Freq: Once | Status: AC
  Administered 2021-05-15: 1
  Filled 2021-05-15: qty 1

## 2021-05-15 MED ORDER — BENZONATATE 100 MG PO CAPS: 100.0000 mg | ORAL_CAPSULE | Freq: Three times a day (TID) | ORAL | 0 refills | Status: DC

## 2021-05-15 MED ORDER — ONDANSETRON HCL 4 MG PO TABS: 4.0000 mg | ORAL_TABLET | Freq: Three times a day (TID) | ORAL | 0 refills | Status: DC | PRN

## 2021-05-15 MED ORDER — ONDANSETRON 4 MG PO TBDP
4.0000 mg | ORAL_TABLET | Freq: Once | ORAL | Status: AC
  Administered 2021-05-15: 4 mg via ORAL
  Filled 2021-05-15: qty 1

## 2021-05-15 NOTE — ED Provider Notes (Signed)
[place] DEPT Provider Note   CSN: [id number] Arrival date & time: 05/14/21  2119     History Chief Complaint  Patient presents with   Nasal Congestion   Cough   Sore Throat    Jason [name] is a 20 y.o. male presents to the ED with 3 days of URI symptoms.  Cough, nasal congestion, body aches, fevers, post tussive emesis, decreased appetite.  Did not get a flu vaccine.  Is not vaccinated for COVID either.  Pt has been taking 400mg  Advil and Dayquil with minimal relief. No aggravating or alleviating factors.  No known sick contacts.  Denies additional medical problems including asthma and immunocompromise.  The history is provided by the patient and medical records. No language interpreter was used.  Sore Throat Associated symptoms include headaches. Pertinent negatives include no chest pain, no abdominal pain and no shortness of breath.      History reviewed. No pertinent past medical history.  There are no problems to display for this patient.   History reviewed. No pertinent surgical history.     Family History  Problem Relation Age of Onset   Healthy Mother    Healthy Father     Social History   Tobacco Use   Smoking status: Never   Smokeless tobacco: Never  Vaping Use   Vaping Use: Every day  Substance Use Topics   Alcohol use: Never   Drug use: Never    Home Medications Prior to Admission medications   Medication Sig Start Date End Date Taking? Authorizing Provider  benzonatate (TESSALON) 100 MG capsule Take 1 capsule (100 mg total) by mouth every 8 (eight) hours. 05/15/21  Yes Mylik Pro, Jarrett Soho, PA-C  ondansetron (ZOFRAN) 4 MG tablet Take 1 tablet (4 mg total) by mouth every 8 (eight) hours as needed for nausea or vomiting. 05/15/21  Yes Shanice Poznanski, Jarrett Soho, PA-C  lidocaine (XYLOCAINE) 2 % solution Use as directed 15 mLs in the mouth or throat every 4 (four) hours as needed for mouth pain. 04/24/21   White, Leitha Schuller,  NP  tiZANidine (ZANAFLEX) 4 MG capsule Take 1 capsule (4 mg total) by mouth at bedtime as needed for muscle spasms. 03/03/21   Raspet, Derry Skill, PA-C  valACYclovir (VALTREX) 1000 MG tablet Take 1 tablet (1,000 mg total) by mouth 3 (three) times daily. 04/24/21   Hans Eden, NP    Allergies    Patient has no known allergies.  Review of Systems   Review of Systems  Constitutional:  Positive for appetite change, fatigue and fever. Negative for diaphoresis and unexpected weight change.  HENT:  Positive for congestion and sore throat. Negative for mouth sores.   Eyes:  Negative for visual disturbance.  Respiratory:  Positive for cough. Negative for chest tightness, shortness of breath and wheezing.   Cardiovascular:  Negative for chest pain.  Gastrointestinal:  Positive for nausea and vomiting. Negative for abdominal pain, constipation and diarrhea.  Endocrine: Negative for polydipsia, polyphagia and polyuria.  Genitourinary:  Negative for dysuria, frequency, hematuria and urgency.  Musculoskeletal:  Negative for back pain and neck stiffness.  Skin:  Negative for rash.  Allergic/Immunologic: Negative for immunocompromised state.  Neurological:  Positive for headaches. Negative for syncope and light-headedness.  Hematological:  Does not bruise/bleed easily.  Psychiatric/Behavioral:  Negative for sleep disturbance. The patient is not nervous/anxious.    Physical Exam Updated Vital Signs BP (!) 129/98 (BP Location: Left Arm)   Pulse 93   Temp 98.4 F (36.9  C) (Oral) Comment: took ibuprofen 400mg  @ 19:00  Resp 16   Ht 5\' 11"  (1.803 m)   Wt 82 kg   SpO2 97%   BMI 25.20 kg/m   Physical Exam Vitals and nursing note reviewed.  Constitutional:      General: He is not in acute distress.    Appearance: He is not diaphoretic.  HENT:     Head: Normocephalic.  Eyes:     General: No scleral icterus.    Conjunctiva/sclera: Conjunctivae normal.  Cardiovascular:     Rate and Rhythm:  Normal rate and regular rhythm.     Pulses: Normal pulses.          Radial pulses are 2+ on the right side and 2+ on the left side.  Pulmonary:     Effort: Pulmonary effort is normal. No tachypnea, accessory muscle usage, prolonged expiration, respiratory distress or retractions.     Breath sounds: No stridor.     Comments: Equal chest rise. No increased work of breathing. Coarse breath sounds Abdominal:     General: There is no distension.     Palpations: Abdomen is soft.     Tenderness: There is no abdominal tenderness. There is no guarding or rebound.  Musculoskeletal:     Cervical back: Normal range of motion.     Comments: Moves all extremities equally and without difficulty.  Skin:    General: Skin is warm and dry.     Capillary Refill: Capillary refill takes less than 2 seconds.  Neurological:     Mental Status: He is alert.     GCS: GCS eye subscore is 4. GCS verbal subscore is 5. GCS motor subscore is 6.     Comments: Speech is clear and goal oriented.  Psychiatric:        Mood and Affect: Mood normal.    ED Results / Procedures / Treatments   Labs (all labs ordered are listed, but only abnormal results are displayed) Labs Reviewed  RESP PANEL BY RT-PCR (FLU A&B, COVID) ARPGX2 - Abnormal; Notable for the following components:      Result Value   Influenza A by PCR POSITIVE (*)    All other components within normal limits  GROUP A STREP BY PCR    EKG None  Radiology No results found.  Procedures Procedures   Medications Ordered in ED Medications  albuterol (VENTOLIN HFA) 108 (90 Base) MCG/ACT inhaler 2 puff (has no administration in time range)  aerochamber Z-Stat Plus/medium 1 each (has no administration in time range)  ondansetron (ZOFRAN-ODT) disintegrating tablet 4 mg (has no administration in time range)    ED Course  I have reviewed the triage vital signs and the nursing notes.  Pertinent labs & imaging results that were available during my care  of the patient were reviewed by me and considered in my medical decision making (see chart for details).    MDM Rules/Calculators/A&P                           Patient with symptoms consistent with influenza -influenza positive here.  Vitals are stable, low-grade fever.  No signs of dehydration, tolerating PO's.  Lungs are clear. Due to patient's presentation and physical exam a chest x-ray was not ordered bc likely diagnosis of flu.  Patient will be discharged with instructions to orally hydrate, rest, and use over-the-counter medications such as anti-inflammatories ibuprofen and Aleve for muscle aches and Tylenol for fever.  Patient will also be given a cough suppressant and albuterol.    Final Clinical Impression(s) / ED Diagnoses Final diagnoses:  Influenza A    Rx / DC Orders ED Discharge Orders          Ordered    ondansetron (ZOFRAN) 4 MG tablet  Every 8 hours PRN        05/15/21 0031    benzonatate (TESSALON) 100 MG capsule  Every 8 hours        05/15/21 0031             Miyanna Wiersma, Jarrett Soho, PA-C 05/15/21 0045    Veryl Speak, MD 05/15/21 [phone]

## 2021-05-15 NOTE — Discharge Instructions (Addendum)
1. Medications: albuterol, zofran, tessalon, usual home medications 2. Treatment: rest, drink plenty of fluids, take tylenol or ibuprofen for fever control 3. Follow Up: Please followup with your primary doctor in 3 days for discussion of your diagnoses and further evaluation after today's visit; if you do not have a primary care doctor use the resource guide provided to find one; Return to the ER for high fevers, difficulty breathing or other concerning symptoms

## 2021-06-08 ENCOUNTER — Emergency Department (HOSPITAL_COMMUNITY): Payer: BC Managed Care – PPO

## 2021-06-08 ENCOUNTER — Inpatient Hospital Stay (HOSPITAL_COMMUNITY): Payer: BC Managed Care – PPO

## 2021-06-08 ENCOUNTER — Emergency Department (HOSPITAL_COMMUNITY): Payer: BC Managed Care – PPO | Admitting: Anesthesiology

## 2021-06-08 ENCOUNTER — Inpatient Hospital Stay (HOSPITAL_COMMUNITY)
Admission: EM | Admit: 2021-06-08 | Discharge: 2021-06-27 | DRG: 957 | Disposition: A | Discharge status: Rehabilitation Facility | Payer: BC Managed Care – PPO | Attending: General Surgery | Admitting: General Surgery

## 2021-06-08 ENCOUNTER — Encounter (HOSPITAL_COMMUNITY): Admission: EM | Disposition: A | Discharge status: Rehabilitation Facility | Payer: Self-pay | Source: Home / Self Care

## 2021-06-08 ENCOUNTER — Encounter (HOSPITAL_COMMUNITY): Payer: Self-pay | Admitting: Emergency Medicine

## 2021-06-08 DIAGNOSIS — S37899A Unspecified injury of other urinary and pelvic organ, initial encounter: Secondary | ICD-10-CM | POA: Diagnosis not present

## 2021-06-08 DIAGNOSIS — R339 Retention of urine, unspecified: Secondary | ICD-10-CM | POA: Diagnosis not present

## 2021-06-08 DIAGNOSIS — S2243XA Multiple fractures of ribs, bilateral, initial encounter for closed fracture: Secondary | ICD-10-CM | POA: Diagnosis present

## 2021-06-08 DIAGNOSIS — S15001A Unspecified injury of right carotid artery, initial encounter: Secondary | ICD-10-CM | POA: Diagnosis not present

## 2021-06-08 DIAGNOSIS — J942 Hemothorax: Secondary | ICD-10-CM

## 2021-06-08 DIAGNOSIS — E8770 Fluid overload, unspecified: Secondary | ICD-10-CM | POA: Diagnosis not present

## 2021-06-08 DIAGNOSIS — S2242XA Multiple fractures of ribs, left side, initial encounter for closed fracture: Secondary | ICD-10-CM

## 2021-06-08 DIAGNOSIS — E876 Hypokalemia: Secondary | ICD-10-CM | POA: Diagnosis not present

## 2021-06-08 DIAGNOSIS — S36113A Laceration of liver, unspecified degree, initial encounter: Secondary | ICD-10-CM

## 2021-06-08 DIAGNOSIS — D62 Acute posthemorrhagic anemia: Secondary | ICD-10-CM | POA: Diagnosis not present

## 2021-06-08 DIAGNOSIS — S36115A Moderate laceration of liver, initial encounter: Secondary | ICD-10-CM | POA: Diagnosis present

## 2021-06-08 DIAGNOSIS — S37052A Moderate laceration of left kidney, initial encounter: Secondary | ICD-10-CM | POA: Diagnosis present

## 2021-06-08 DIAGNOSIS — L8990 Pressure ulcer of unspecified site, unspecified stage: Secondary | ICD-10-CM | POA: Insufficient documentation

## 2021-06-08 DIAGNOSIS — S37042A Minor laceration of left kidney, initial encounter: Secondary | ICD-10-CM | POA: Diagnosis not present

## 2021-06-08 DIAGNOSIS — S2500XA Unspecified injury of thoracic aorta, initial encounter: Secondary | ICD-10-CM | POA: Diagnosis present

## 2021-06-08 DIAGNOSIS — I7771 Dissection of carotid artery: Secondary | ICD-10-CM | POA: Diagnosis present

## 2021-06-08 DIAGNOSIS — S069X0S Unspecified intracranial injury without loss of consciousness, sequela: Secondary | ICD-10-CM | POA: Diagnosis not present

## 2021-06-08 DIAGNOSIS — S15091A Other specified injury of right carotid artery, initial encounter: Secondary | ICD-10-CM | POA: Diagnosis present

## 2021-06-08 DIAGNOSIS — J9690 Respiratory failure, unspecified, unspecified whether with hypoxia or hypercapnia: Secondary | ICD-10-CM

## 2021-06-08 DIAGNOSIS — T1490XA Injury, unspecified, initial encounter: Secondary | ICD-10-CM

## 2021-06-08 DIAGNOSIS — R1312 Dysphagia, oropharyngeal phase: Secondary | ICD-10-CM | POA: Diagnosis not present

## 2021-06-08 DIAGNOSIS — S15092A Other specified injury of left carotid artery, initial encounter: Secondary | ICD-10-CM | POA: Diagnosis present

## 2021-06-08 DIAGNOSIS — Y92410 Unspecified street and highway as the place of occurrence of the external cause: Secondary | ICD-10-CM | POA: Diagnosis not present

## 2021-06-08 DIAGNOSIS — R402212 Coma scale, best verbal response, none, at arrival to emergency department: Secondary | ICD-10-CM | POA: Diagnosis present

## 2021-06-08 DIAGNOSIS — R578 Other shock: Secondary | ICD-10-CM | POA: Diagnosis present

## 2021-06-08 DIAGNOSIS — Z789 Other specified health status: Secondary | ICD-10-CM

## 2021-06-08 DIAGNOSIS — I609 Nontraumatic subarachnoid hemorrhage, unspecified: Secondary | ICD-10-CM

## 2021-06-08 DIAGNOSIS — Z79899 Other long term (current) drug therapy: Secondary | ICD-10-CM

## 2021-06-08 DIAGNOSIS — G9389 Other specified disorders of brain: Secondary | ICD-10-CM | POA: Diagnosis present

## 2021-06-08 DIAGNOSIS — R000 Tachycardia, unspecified: Secondary | ICD-10-CM | POA: Diagnosis not present

## 2021-06-08 DIAGNOSIS — Z833 Family history of diabetes mellitus: Secondary | ICD-10-CM

## 2021-06-08 DIAGNOSIS — S066X9A Traumatic subarachnoid hemorrhage with loss of consciousness of unspecified duration, initial encounter: Principal | ICD-10-CM | POA: Diagnosis present

## 2021-06-08 DIAGNOSIS — J155 Pneumonia due to Escherichia coli: Secondary | ICD-10-CM | POA: Diagnosis not present

## 2021-06-08 DIAGNOSIS — Z20822 Contact with and (suspected) exposure to covid-19: Secondary | ICD-10-CM | POA: Diagnosis present

## 2021-06-08 DIAGNOSIS — S36031A Moderate laceration of spleen, initial encounter: Secondary | ICD-10-CM | POA: Diagnosis present

## 2021-06-08 DIAGNOSIS — Z809 Family history of malignant neoplasm, unspecified: Secondary | ICD-10-CM

## 2021-06-08 DIAGNOSIS — S35329A Unspecified injury of splenic vein, initial encounter: Secondary | ICD-10-CM | POA: Diagnosis not present

## 2021-06-08 DIAGNOSIS — I729 Aneurysm of unspecified site: Secondary | ICD-10-CM

## 2021-06-08 DIAGNOSIS — S0219XA Other fracture of base of skull, initial encounter for closed fracture: Secondary | ICD-10-CM

## 2021-06-08 DIAGNOSIS — J80 Acute respiratory distress syndrome: Secondary | ICD-10-CM | POA: Diagnosis present

## 2021-06-08 DIAGNOSIS — Z978 Presence of other specified devices: Secondary | ICD-10-CM

## 2021-06-08 DIAGNOSIS — R29810 Facial weakness: Secondary | ICD-10-CM | POA: Diagnosis not present

## 2021-06-08 DIAGNOSIS — G8194 Hemiplegia, unspecified affecting left nondominant side: Secondary | ICD-10-CM | POA: Diagnosis present

## 2021-06-08 DIAGNOSIS — S069X2S Unspecified intracranial injury with loss of consciousness of 31 minutes to 59 minutes, sequela: Secondary | ICD-10-CM | POA: Diagnosis not present

## 2021-06-08 DIAGNOSIS — G479 Sleep disorder, unspecified: Secondary | ICD-10-CM | POA: Diagnosis not present

## 2021-06-08 DIAGNOSIS — R402112 Coma scale, eyes open, never, at arrival to emergency department: Secondary | ICD-10-CM | POA: Diagnosis present

## 2021-06-08 DIAGNOSIS — G4720 Circadian rhythm sleep disorder, unspecified type: Secondary | ICD-10-CM | POA: Diagnosis not present

## 2021-06-08 DIAGNOSIS — S065XAA Traumatic subdural hemorrhage with loss of consciousness status unknown, initial encounter: Secondary | ICD-10-CM | POA: Diagnosis present

## 2021-06-08 DIAGNOSIS — S37002A Unspecified injury of left kidney, initial encounter: Secondary | ICD-10-CM

## 2021-06-08 DIAGNOSIS — S0411XA Injury of oculomotor nerve, right side, initial encounter: Secondary | ICD-10-CM | POA: Diagnosis present

## 2021-06-08 DIAGNOSIS — R0902 Hypoxemia: Secondary | ICD-10-CM

## 2021-06-08 DIAGNOSIS — S0285XA Fracture of orbit, unspecified, initial encounter for closed fracture: Secondary | ICD-10-CM | POA: Diagnosis present

## 2021-06-08 DIAGNOSIS — N179 Acute kidney failure, unspecified: Secondary | ICD-10-CM | POA: Diagnosis not present

## 2021-06-08 DIAGNOSIS — S065X0A Traumatic subdural hemorrhage without loss of consciousness, initial encounter: Secondary | ICD-10-CM | POA: Diagnosis not present

## 2021-06-08 DIAGNOSIS — R935 Abnormal findings on diagnostic imaging of other abdominal regions, including retroperitoneum: Secondary | ICD-10-CM | POA: Diagnosis not present

## 2021-06-08 DIAGNOSIS — E870 Hyperosmolality and hypernatremia: Secondary | ICD-10-CM | POA: Diagnosis not present

## 2021-06-08 DIAGNOSIS — S069X3S Unspecified intracranial injury with loss of consciousness of 1 hour to 5 hours 59 minutes, sequela: Secondary | ICD-10-CM | POA: Diagnosis not present

## 2021-06-08 DIAGNOSIS — Z95828 Presence of other vascular implants and grafts: Secondary | ICD-10-CM

## 2021-06-08 DIAGNOSIS — S066XAA Traumatic subarachnoid hemorrhage with loss of consciousness status unknown, initial encounter: Secondary | ICD-10-CM | POA: Diagnosis present

## 2021-06-08 DIAGNOSIS — H02401 Unspecified ptosis of right eyelid: Secondary | ICD-10-CM | POA: Diagnosis present

## 2021-06-08 DIAGNOSIS — F1010 Alcohol abuse, uncomplicated: Secondary | ICD-10-CM

## 2021-06-08 DIAGNOSIS — R402332 Coma scale, best motor response, abnormal, at arrival to emergency department: Secondary | ICD-10-CM | POA: Diagnosis present

## 2021-06-08 DIAGNOSIS — S02109A Fracture of base of skull, unspecified side, initial encounter for closed fracture: Secondary | ICD-10-CM

## 2021-06-08 DIAGNOSIS — R490 Dysphonia: Secondary | ICD-10-CM | POA: Diagnosis not present

## 2021-06-08 DIAGNOSIS — R7989 Other specified abnormal findings of blood chemistry: Secondary | ICD-10-CM | POA: Diagnosis not present

## 2021-06-08 DIAGNOSIS — Z9289 Personal history of other medical treatment: Secondary | ICD-10-CM

## 2021-06-08 DIAGNOSIS — J90 Pleural effusion, not elsewhere classified: Secondary | ICD-10-CM

## 2021-06-08 DIAGNOSIS — D6489 Other specified anemias: Secondary | ICD-10-CM | POA: Diagnosis present

## 2021-06-08 DIAGNOSIS — S069X4S Unspecified intracranial injury with loss of consciousness of 6 hours to 24 hours, sequela: Secondary | ICD-10-CM | POA: Diagnosis not present

## 2021-06-08 DIAGNOSIS — Z9889 Other specified postprocedural states: Secondary | ICD-10-CM

## 2021-06-08 DIAGNOSIS — Z4659 Encounter for fitting and adjustment of other gastrointestinal appliance and device: Secondary | ICD-10-CM

## 2021-06-08 DIAGNOSIS — R55 Syncope and collapse: Secondary | ICD-10-CM | POA: Diagnosis not present

## 2021-06-08 DIAGNOSIS — S01312A Laceration without foreign body of left ear, initial encounter: Secondary | ICD-10-CM | POA: Diagnosis present

## 2021-06-08 DIAGNOSIS — S36039A Unspecified laceration of spleen, initial encounter: Secondary | ICD-10-CM

## 2021-06-08 DIAGNOSIS — S15002A Unspecified injury of left carotid artery, initial encounter: Secondary | ICD-10-CM | POA: Diagnosis not present

## 2021-06-08 DIAGNOSIS — Z01818 Encounter for other preprocedural examination: Secondary | ICD-10-CM

## 2021-06-08 HISTORY — PX: RADIOLOGY WITH ANESTHESIA: SHX6223

## 2021-06-08 HISTORY — PX: IR ANGIO EXTRACRAN SEL COM CAROTID INNOMINATE UNI BILAT MOD SED: IMG5357

## 2021-06-08 HISTORY — PX: IR ANGIO VERTEBRAL SEL VERTEBRAL BILAT MOD SED: IMG5369

## 2021-06-08 LAB — POCT I-STAT 7, (LYTES, BLD GAS, ICA,H+H)
Acid-base deficit: 6 mmol/L — ABNORMAL HIGH (ref 0.0–2.0)
Bicarbonate: 21.6 mmol/L (ref 20.0–28.0)
Calcium, Ion: 1.13 mmol/L — ABNORMAL LOW (ref 1.15–1.40)
HCT: 33 % — ABNORMAL LOW (ref 39.0–52.0)
Hemoglobin: 11.2 g/dL — ABNORMAL LOW (ref 13.0–17.0)
O2 Saturation: 100 %
Patient temperature: 35
Potassium: 3.5 mmol/L (ref 3.5–5.1)
Sodium: 140 mmol/L (ref 135–145)
TCO2: 23 mmol/L (ref 22–32)
pCO2 arterial: 45 mmHg (ref 32.0–48.0)
pH, Arterial: 7.278 — ABNORMAL LOW (ref 7.350–7.450)
pO2, Arterial: 541 mmHg — ABNORMAL HIGH (ref 83.0–108.0)

## 2021-06-08 LAB — POCT I-STAT, CHEM 8
BUN: 16 mg/dL (ref 6–20)
Calcium, Ion: 1.12 mmol/L — ABNORMAL LOW (ref 1.15–1.40)
Chloride: 106 mmol/L (ref 98–111)
Creatinine, Ser: 1.2 mg/dL (ref 0.61–1.24)
Glucose, Bld: 172 mg/dL — ABNORMAL HIGH (ref 70–99)
HCT: 44 % (ref 39.0–52.0)
Hemoglobin: 15 g/dL (ref 13.0–17.0)
Potassium: 3.1 mmol/L — ABNORMAL LOW (ref 3.5–5.1)
Sodium: 142 mmol/L (ref 135–145)
TCO2: 20 mmol/L — ABNORMAL LOW (ref 22–32)

## 2021-06-08 LAB — BASIC METABOLIC PANEL
Anion gap: 10 (ref 5–15)
BUN: 14 mg/dL (ref 6–20)
CO2: 19 mmol/L — ABNORMAL LOW (ref 22–32)
Calcium: 7.7 mg/dL — ABNORMAL LOW (ref 8.9–10.3)
Chloride: 111 mmol/L (ref 98–111)
Creatinine, Ser: 1.02 mg/dL (ref 0.61–1.24)
GFR, Estimated: 50 mL/min — ABNORMAL LOW (ref >60)
Glucose, Bld: 145 mg/dL — ABNORMAL HIGH (ref 70–99)
Potassium: 4.3 mmol/L (ref 3.5–5.1)
Sodium: 140 mmol/L (ref 135–145)

## 2021-06-08 LAB — I-STAT ARTERIAL BLOOD GAS, ED
Acid-base deficit: 7 mmol/L — ABNORMAL HIGH (ref 0.0–2.0)
Bicarbonate: 20.4 mmol/L (ref 20.0–28.0)
Calcium, Ion: 1.17 mmol/L (ref 1.15–1.40)
HCT: 34 % — ABNORMAL LOW (ref 39.0–52.0)
Hemoglobin: 11.6 g/dL — ABNORMAL LOW (ref 13.0–17.0)
O2 Saturation: 100 %
Patient temperature: 98.6
Potassium: 2.9 mmol/L — ABNORMAL LOW (ref 3.5–5.1)
Sodium: 139 mmol/L (ref 135–145)
TCO2: 22 mmol/L (ref 22–32)
pCO2 arterial: 49.5 mmHg — ABNORMAL HIGH (ref 32.0–48.0)
pH, Arterial: 7.222 — ABNORMAL LOW (ref 7.350–7.450)
pO2, Arterial: 431 mmHg — ABNORMAL HIGH (ref 83.0–108.0)

## 2021-06-08 LAB — URINALYSIS, ROUTINE W REFLEX MICROSCOPIC
Bacteria, UA: NONE SEEN
Bilirubin Urine: NEGATIVE
Glucose, UA: NEGATIVE mg/dL
Ketones, ur: NEGATIVE mg/dL
Leukocytes,Ua: NEGATIVE
Nitrite: NEGATIVE
Protein, ur: 30 mg/dL — AB
RBC / HPF: >50 RBC/hpf — ABNORMAL HIGH (ref 0–5)
Specific Gravity, Urine: >1.046 — ABNORMAL HIGH (ref 1.005–1.030)
pH: 5 (ref 5.0–8.0)

## 2021-06-08 LAB — CBC
HCT: 44 % (ref 39.0–52.0)
HCT: 35.8 % — ABNORMAL LOW (ref 39.0–52.0)
Hemoglobin: 14.1 g/dL (ref 13.0–17.0)
Hemoglobin: 12 g/dL — ABNORMAL LOW (ref 13.0–17.0)
MCH: 29.4 pg (ref 26.0–34.0)
MCH: 30 pg (ref 26.0–34.0)
MCHC: 32 g/dL (ref 30.0–36.0)
MCHC: 33.5 g/dL (ref 30.0–36.0)
MCV: 91.7 fL (ref 80.0–100.0)
MCV: 89.5 fL (ref 80.0–100.0)
Platelets: 451 10*3/uL — ABNORMAL HIGH (ref 150–400)
Platelets: 351 10*3/uL (ref 150–400)
RBC: 4.8 MIL/uL (ref 4.22–5.81)
RBC: 4 MIL/uL — ABNORMAL LOW (ref 4.22–5.81)
RDW: 12.6 % (ref 11.5–15.5)
RDW: 12.8 % (ref 11.5–15.5)
WBC: 25.1 10*3/uL — ABNORMAL HIGH (ref 4.0–10.5)
WBC: 30.8 10*3/uL — ABNORMAL HIGH (ref 4.0–10.5)
nRBC: 0 % (ref 0.0–0.2)
nRBC: 0 % (ref 0.0–0.2)

## 2021-06-08 LAB — HEMOGLOBIN AND HEMATOCRIT, BLOOD
HCT: 36.1 % — ABNORMAL LOW (ref 39.0–52.0)
HCT: 34.3 % — ABNORMAL LOW (ref 39.0–52.0)
HCT: 34.1 % — ABNORMAL LOW (ref 39.0–52.0)
HCT: 32 % — ABNORMAL LOW (ref 39.0–52.0)
Hemoglobin: 12 g/dL — ABNORMAL LOW (ref 13.0–17.0)
Hemoglobin: 11.6 g/dL — ABNORMAL LOW (ref 13.0–17.0)
Hemoglobin: 11.4 g/dL — ABNORMAL LOW (ref 13.0–17.0)
Hemoglobin: 11.1 g/dL — ABNORMAL LOW (ref 13.0–17.0)

## 2021-06-08 LAB — I-STAT CHEM 8, ED
BUN: 16 mg/dL (ref 8–23)
Calcium, Ion: 1.12 mmol/L — ABNORMAL LOW (ref 1.15–1.40)
Chloride: 106 mmol/L (ref 98–111)
Creatinine, Ser: 1.2 mg/dL (ref 0.61–1.24)
Glucose, Bld: 172 mg/dL — ABNORMAL HIGH (ref 70–99)
HCT: 44 % (ref 39.0–52.0)
Hemoglobin: 15 g/dL (ref 13.0–17.0)
Potassium: 3.1 mmol/L — ABNORMAL LOW (ref 3.5–5.1)
Sodium: 142 mmol/L (ref 135–145)
TCO2: 20 mmol/L — ABNORMAL LOW (ref 22–32)

## 2021-06-08 LAB — COMPREHENSIVE METABOLIC PANEL
ALT: 122 U/L — ABNORMAL HIGH (ref 0–44)
AST: 175 U/L — ABNORMAL HIGH (ref 15–41)
Albumin: 3.9 g/dL (ref 3.5–5.0)
Alkaline Phosphatase: 72 U/L (ref 38–126)
Anion gap: 14 (ref 5–15)
BUN: 15 mg/dL (ref 6–20)
CO2: 17 mmol/L — ABNORMAL LOW (ref 22–32)
Calcium: 8.5 mg/dL — ABNORMAL LOW (ref 8.9–10.3)
Chloride: 107 mmol/L (ref 98–111)
Creatinine, Ser: 1.16 mg/dL (ref 0.61–1.24)
GFR, Estimated: 42 mL/min — ABNORMAL LOW (ref >60)
Glucose, Bld: 189 mg/dL — ABNORMAL HIGH (ref 70–99)
Potassium: 3.2 mmol/L — ABNORMAL LOW (ref 3.5–5.1)
Sodium: 138 mmol/L (ref 135–145)
Total Bilirubin: 0.6 mg/dL (ref 0.3–1.2)
Total Protein: 7.3 g/dL (ref 6.5–8.1)

## 2021-06-08 LAB — PREPARE RBC (CROSSMATCH)

## 2021-06-08 LAB — RESP PANEL BY RT-PCR (FLU A&B, COVID) ARPGX2
Influenza A by PCR: NEGATIVE
Influenza B by PCR: NEGATIVE
SARS Coronavirus 2 by RT PCR: NEGATIVE

## 2021-06-08 LAB — ETHANOL: Alcohol, Ethyl (B): 83 mg/dL — ABNORMAL HIGH (ref <10)

## 2021-06-08 LAB — MRSA NEXT GEN BY PCR, NASAL: MRSA by PCR Next Gen: NOT DETECTED

## 2021-06-08 LAB — GLUCOSE, CAPILLARY: Glucose-Capillary: 106 mg/dL — ABNORMAL HIGH (ref 70–99)

## 2021-06-08 LAB — LACTIC ACID, PLASMA
Lactic Acid, Venous: 5.3 mmol/L (ref 0.5–1.9)
Lactic Acid, Venous: 4 mmol/L (ref 0.5–1.9)
Lactic Acid, Venous: 2.7 mmol/L (ref 0.5–1.9)

## 2021-06-08 LAB — PROTIME-INR
INR: 1 (ref 0.8–1.2)
Prothrombin Time: 13.5 seconds (ref 11.4–15.2)

## 2021-06-08 LAB — ABO/RH: ABO/RH(D): O POS

## 2021-06-08 SURGERY — LAPAROTOMY, EXPLORATORY
Anesthesia: General

## 2021-06-08 SURGERY — IR WITH ANESTHESIA
Anesthesia: General

## 2021-06-08 MED ORDER — ETOMIDATE 2 MG/ML IV SOLN
INTRAVENOUS | Status: AC
  Filled 2021-06-08: qty 20

## 2021-06-08 MED ORDER — NOREPINEPHRINE 4 MG/250ML-% IV SOLN
INTRAVENOUS | Status: AC
  Filled 2021-06-08: qty 250

## 2021-06-08 MED ORDER — SODIUM CHLORIDE 0.9 % IV SOLN
250.0000 mL | INTRAVENOUS | Status: DC
  Administered 2021-06-08 – 2021-06-20 (×2): 250 mL via INTRAVENOUS

## 2021-06-08 MED ORDER — PROPOFOL 1000 MG/100ML IV EMUL
INTRAVENOUS | Status: AC
  Administered 2021-06-08: 5 ug/kg/min via INTRAVENOUS
  Filled 2021-06-08: qty 100

## 2021-06-08 MED ORDER — FENTANYL 2500MCG IN NS 250ML (10MCG/ML) PREMIX INFUSION
0.0000 ug/h | INTRAVENOUS | Status: DC
  Administered 2021-06-08: 25 ug/h via INTRAVENOUS

## 2021-06-08 MED ORDER — NITROGLYCERIN 1 MG/10 ML FOR IR/CATH LAB
INTRA_ARTERIAL | Status: AC
  Filled 2021-06-08: qty 10

## 2021-06-08 MED ORDER — FENTANYL CITRATE (PF) 250 MCG/5ML IJ SOLN
INTRAMUSCULAR | Status: DC | PRN
  Administered 2021-06-08 (×5): 50 ug via INTRAVENOUS

## 2021-06-08 MED ORDER — FENTANYL CITRATE PF 50 MCG/ML IJ SOSY
50.0000 ug | PREFILLED_SYRINGE | Freq: Once | INTRAMUSCULAR | Status: AC
  Administered 2021-06-08: 50 ug via INTRAVENOUS

## 2021-06-08 MED ORDER — ACETAMINOPHEN 160 MG/5ML PO SOLN
325.0000 mg | Freq: Four times a day (QID) | ORAL | Status: DC
  Filled 2021-06-08: qty 20.3

## 2021-06-08 MED ORDER — MORPHINE SULFATE (PF) 2 MG/ML IV SOLN: 2.0000 mg | INTRAVENOUS | Status: DC | PRN

## 2021-06-08 MED ORDER — ROCURONIUM BROMIDE 50 MG/5ML IV SOLN
INTRAVENOUS | Status: AC | PRN
  Administered 2021-06-08: 100 mg via INTRAVENOUS

## 2021-06-08 MED ORDER — CEFAZOLIN SODIUM-DEXTROSE 2-4 GM/100ML-% IV SOLN
INTRAVENOUS | Status: AC
  Filled 2021-06-08: qty 100

## 2021-06-08 MED ORDER — POLYETHYLENE GLYCOL 3350 17 G PO PACK: 17.0000 g | PACK | Freq: Every day | ORAL | Status: DC

## 2021-06-08 MED ORDER — TICAGRELOR 90 MG PO TABS
ORAL_TABLET | ORAL | Status: AC
  Filled 2021-06-08: qty 2

## 2021-06-08 MED ORDER — FENTANYL 2500MCG IN NS 250ML (10MCG/ML) PREMIX INFUSION
50.0000 ug/h | INTRAVENOUS | Status: DC
  Administered 2021-06-08: 100 ug/h via INTRAVENOUS

## 2021-06-08 MED ORDER — CHLORHEXIDINE GLUCONATE CLOTH 2 % EX PADS
6.0000 | MEDICATED_PAD | Freq: Every day | CUTANEOUS | Status: DC
  Administered 2021-06-09 – 2021-06-23 (×17): 6 via TOPICAL

## 2021-06-08 MED ORDER — SODIUM CHLORIDE 0.9 % IV SOLN: INTRAVENOUS | Status: DC

## 2021-06-08 MED ORDER — PROPOFOL 1000 MG/100ML IV EMUL
0.0000 ug/kg/min | INTRAVENOUS | Status: DC
  Administered 2021-06-08 – 2021-06-09 (×6): 30 ug/kg/min via INTRAVENOUS
  Administered 2021-06-10: 15 ug/kg/min via INTRAVENOUS
  Administered 2021-06-10 (×2): 30 ug/kg/min via INTRAVENOUS
  Administered 2021-06-12: 25 ug/kg/min via INTRAVENOUS
  Administered 2021-06-12: 10 ug/kg/min via INTRAVENOUS
  Administered 2021-06-13: 20 ug/kg/min via INTRAVENOUS
  Administered 2021-06-13: 25 ug/kg/min via INTRAVENOUS
  Administered 2021-06-14 (×2): 35 ug/kg/min via INTRAVENOUS
  Administered 2021-06-14: 40 ug/kg/min via INTRAVENOUS
  Administered 2021-06-14 – 2021-06-15 (×2): 35 ug/kg/min via INTRAVENOUS
  Administered 2021-06-15: 30 ug/kg/min via INTRAVENOUS
  Filled 2021-06-08 (×5): qty 100
  Filled 2021-06-08: qty 200
  Filled 2021-06-08 (×13): qty 100

## 2021-06-08 MED ORDER — FENTANYL 2500MCG IN NS 250ML (10MCG/ML) PREMIX INFUSION
50.0000 ug/h | INTRAVENOUS | Status: DC
Start: 2021-06-08 — End: 2021-06-13
  Administered 2021-06-08 – 2021-06-10 (×4): 150 ug/h via INTRAVENOUS
  Administered 2021-06-11: 25 ug/h via INTRAVENOUS
  Administered 2021-06-12 – 2021-06-13 (×2): 150 ug/h via INTRAVENOUS
  Filled 2021-06-08 (×6): qty 250

## 2021-06-08 MED ORDER — OXYCODONE HCL 5 MG PO TABS: 5.0000 mg | ORAL_TABLET | ORAL | Status: DC | PRN

## 2021-06-08 MED ORDER — SODIUM CHLORIDE 0.9 % IV SOLN
2.0000 g | Freq: Two times a day (BID) | INTRAVENOUS | Status: DC
  Administered 2021-06-08 – 2021-06-09 (×3): 2 g via INTRAVENOUS
  Filled 2021-06-08 (×3): qty 20

## 2021-06-08 MED ORDER — FENTANYL BOLUS VIA INFUSION
50.0000 ug | INTRAVENOUS | Status: DC | PRN
  Administered 2021-06-08 – 2021-06-11 (×7): 100 ug via INTRAVENOUS
  Administered 2021-06-11 – 2021-06-12 (×2): 50 ug via INTRAVENOUS
  Filled 2021-06-08: qty 100

## 2021-06-08 MED ORDER — EPTIFIBATIDE 20 MG/10ML IV SOLN
INTRAVENOUS | Status: AC
  Filled 2021-06-08: qty 10

## 2021-06-08 MED ORDER — CLOPIDOGREL BISULFATE 300 MG PO TABS
ORAL_TABLET | ORAL | Status: AC
  Filled 2021-06-08: qty 1

## 2021-06-08 MED ORDER — FENTANYL CITRATE PF 50 MCG/ML IJ SOSY
50.0000 ug | PREFILLED_SYRINGE | INTRAMUSCULAR | Status: DC | PRN
  Administered 2021-06-08 – 2021-06-15 (×2): 100 ug via INTRAVENOUS
  Administered 2021-06-15: 200 ug via INTRAVENOUS
  Administered 2021-06-15 – 2021-06-18 (×2): 100 ug via INTRAVENOUS
  Administered 2021-06-18: 50 ug via INTRAVENOUS
  Administered 2021-06-19 (×7): 100 ug via INTRAVENOUS
  Administered 2021-06-20 (×2): 50 ug via INTRAVENOUS
  Administered 2021-06-20: 100 ug via INTRAVENOUS
  Administered 2021-06-20: 50 ug via INTRAVENOUS
  Filled 2021-06-08 (×7): qty 2

## 2021-06-08 MED ORDER — OXYCODONE HCL 5 MG/5ML PO SOLN
10.0000 mg | ORAL | Status: DC
  Filled 2021-06-08: qty 10

## 2021-06-08 MED ORDER — PHENYLEPHRINE HCL-NACL 20-0.9 MG/250ML-% IV SOLN
0.0000 ug/min | INTRAVENOUS | Status: DC
  Administered 2021-06-08: 20 ug/min via INTRAVENOUS
  Filled 2021-06-08: qty 250

## 2021-06-08 MED ORDER — IOHEXOL 300 MG/ML  SOLN
100.0000 mL | Freq: Once | INTRAMUSCULAR | Status: AC | PRN
  Administered 2021-06-08: 20 mL via INTRA_ARTERIAL

## 2021-06-08 MED ORDER — FENTANYL CITRATE (PF) 250 MCG/5ML IJ SOLN
INTRAMUSCULAR | Status: AC
  Filled 2021-06-08: qty 5

## 2021-06-08 MED ORDER — FENTANYL BOLUS VIA INFUSION
50.0000 ug | INTRAVENOUS | Status: DC | PRN
  Filled 2021-06-08: qty 100

## 2021-06-08 MED ORDER — SODIUM CHLORIDE 0.9% FLUSH: 10.0000 mL | INTRAVENOUS | Status: DC | PRN

## 2021-06-08 MED ORDER — ETOMIDATE 2 MG/ML IV SOLN
INTRAVENOUS | Status: AC | PRN
  Administered 2021-06-08: 20 mg via INTRAVENOUS

## 2021-06-08 MED ORDER — MIDAZOLAM HCL 2 MG/2ML IJ SOLN
2.0000 mg | INTRAMUSCULAR | Status: DC | PRN
  Administered 2021-06-09 – 2021-06-13 (×7): 2 mg via INTRAVENOUS
  Filled 2021-06-08 (×10): qty 2

## 2021-06-08 MED ORDER — DOCUSATE SODIUM 50 MG/5ML PO LIQD: 100.0000 mg | Freq: Two times a day (BID) | ORAL | Status: DC

## 2021-06-08 MED ORDER — SODIUM CHLORIDE 0.9% FLUSH
10.0000 mL | Freq: Two times a day (BID) | INTRAVENOUS | Status: DC
  Administered 2021-06-08: 10 mL
  Administered 2021-06-09: 20 mL
  Administered 2021-06-09 – 2021-06-12 (×4): 10 mL
  Administered 2021-06-12: 20 mL
  Administered 2021-06-13 – 2021-06-20 (×13): 10 mL

## 2021-06-08 MED ORDER — IOHEXOL 300 MG/ML  SOLN
100.0000 mL | Freq: Once | INTRAMUSCULAR | Status: AC | PRN
  Administered 2021-06-08: 100 mL via INTRAVENOUS

## 2021-06-08 MED ORDER — CHLORHEXIDINE GLUCONATE 0.12% ORAL RINSE (MEDLINE KIT)
15.0000 mL | Freq: Two times a day (BID) | OROMUCOSAL | Status: DC
  Administered 2021-06-08 – 2021-06-26 (×38): 15 mL via OROMUCOSAL

## 2021-06-08 MED ORDER — ROCURONIUM BROMIDE 100 MG/10ML IV SOLN
INTRAVENOUS | Status: DC | PRN
  Administered 2021-06-08: 100 mg via INTRAVENOUS

## 2021-06-08 MED ORDER — CLEVIDIPINE BUTYRATE 0.5 MG/ML IV EMUL
INTRAVENOUS | Status: AC
  Filled 2021-06-08: qty 50

## 2021-06-08 MED ORDER — SODIUM CHLORIDE 0.9 % IV BOLUS
1000.0000 mL | INTRAVENOUS | Status: AC
  Administered 2021-06-08 (×2): 1000 mL via INTRAVENOUS

## 2021-06-08 MED ORDER — CANGRELOR TETRASODIUM 50 MG IV SOLR
INTRAVENOUS | Status: AC
  Filled 2021-06-08: qty 50

## 2021-06-08 MED ORDER — MIDAZOLAM HCL 2 MG/2ML IJ SOLN
2.0000 mg | INTRAMUSCULAR | Status: AC | PRN
  Administered 2021-06-10 – 2021-06-11 (×3): 2 mg via INTRAVENOUS
  Filled 2021-06-08 (×4): qty 2

## 2021-06-08 MED ORDER — NOREPINEPHRINE 4 MG/250ML-% IV SOLN
0.0000 ug/min | INTRAVENOUS | Status: DC
  Administered 2021-06-08: 2 ug/min via INTRAVENOUS

## 2021-06-08 MED ORDER — FENTANYL CITRATE (PF) 100 MCG/2ML IJ SOLN: 50.0000 ug | Freq: Once | INTRAMUSCULAR | Status: DC

## 2021-06-08 MED ORDER — TIROFIBAN HCL IN NACL 5-0.9 MG/100ML-% IV SOLN
INTRAVENOUS | Status: AC
  Filled 2021-06-08: qty 100

## 2021-06-08 MED ORDER — DOCUSATE SODIUM 50 MG/5ML PO LIQD
100.0000 mg | Freq: Two times a day (BID) | ORAL | Status: DC
  Administered 2021-06-09 – 2021-06-23 (×15): 100 mg
  Filled 2021-06-08 (×18): qty 10

## 2021-06-08 MED ORDER — VERAPAMIL HCL 2.5 MG/ML IV SOLN
INTRAVENOUS | Status: AC
  Filled 2021-06-08: qty 2

## 2021-06-08 MED ORDER — IOHEXOL 300 MG/ML  SOLN
100.0000 mL | Freq: Once | INTRAMUSCULAR | Status: AC | PRN
  Administered 2021-06-08: 45 mL via INTRA_ARTERIAL

## 2021-06-08 MED ORDER — PROPOFOL 1000 MG/100ML IV EMUL: 5.0000 ug/kg/min | INTRAVENOUS | Status: DC

## 2021-06-08 MED ORDER — GABAPENTIN 250 MG/5ML PO SOLN
100.0000 mg | Freq: Three times a day (TID) | ORAL | Status: DC
  Administered 2021-06-08 – 2021-06-15 (×18): 100 mg
  Filled 2021-06-08 (×26): qty 2

## 2021-06-08 MED ORDER — ROCURONIUM BROMIDE 10 MG/ML (PF) SYRINGE
PREFILLED_SYRINGE | INTRAVENOUS | Status: AC
  Filled 2021-06-08: qty 10

## 2021-06-08 MED ORDER — POLYETHYLENE GLYCOL 3350 17 G PO PACK
17.0000 g | PACK | Freq: Every day | ORAL | Status: DC
  Administered 2021-06-09 – 2021-06-11 (×2): 17 g
  Filled 2021-06-08 (×2): qty 1

## 2021-06-08 MED ORDER — ORAL CARE MOUTH RINSE
15.0000 mL | OROMUCOSAL | Status: DC
  Administered 2021-06-08 – 2021-06-24 (×154): 15 mL via OROMUCOSAL

## 2021-06-08 MED ORDER — SODIUM CHLORIDE 0.9% IV SOLUTION: Freq: Once | INTRAVENOUS | Status: AC

## 2021-06-08 MED ORDER — PROPOFOL 1000 MG/100ML IV EMUL
0.0000 ug/kg/min | INTRAVENOUS | Status: DC
  Administered 2021-06-08: 30 ug/kg/min via INTRAVENOUS

## 2021-06-08 MED ORDER — ASPIRIN 81 MG PO CHEW
CHEWABLE_TABLET | ORAL | Status: AC
  Filled 2021-06-08: qty 1

## 2021-06-08 MED ORDER — SODIUM CHLORIDE 0.9 % IV SOLN: INTRAVENOUS | Status: DC | PRN

## 2021-06-08 MED ORDER — FENTANYL CITRATE (PF) 100 MCG/2ML IJ SOLN
INTRAMUSCULAR | Status: AC
  Filled 2021-06-08: qty 2

## 2021-06-08 MED ORDER — IOHEXOL 350 MG/ML SOLN
75.0000 mL | Freq: Once | INTRAVENOUS | Status: AC | PRN
  Administered 2021-06-08: 75 mL via INTRAVENOUS

## 2021-06-08 MED ORDER — FENTANYL CITRATE PF 50 MCG/ML IJ SOSY
50.0000 ug | PREFILLED_SYRINGE | INTRAMUSCULAR | Status: DC | PRN
  Administered 2021-06-09: 50 ug via INTRAVENOUS

## 2021-06-08 MED ORDER — ACETAMINOPHEN 160 MG/5ML PO SOLN: 650.0000 mg | ORAL | Status: DC | PRN

## 2021-06-08 MED ORDER — FENTANYL CITRATE PF 50 MCG/ML IJ SOSY: 50.0000 ug | PREFILLED_SYRINGE | Freq: Once | INTRAMUSCULAR | Status: DC

## 2021-06-08 NOTE — Progress Notes (Signed)
PT Cancellation Note  Patient Details Name: [name] MRN: [id number] DOB: 07/13/1876   Cancelled Treatment:    Reason Eval/Treat Not Completed: Medical issues which prohibited therapy. Per RN, must stay supine in bed s/p IR procedure this morning. PT will continue to f/u with pt acutely and await medical appropriateness.   Alessandra Bevels Saima Monterroso 06/08/2021, 9:27 AM

## 2021-06-08 NOTE — Anesthesia Postprocedure Evaluation (Signed)
Anesthesia Post Note  Patient: Jason [name]  Procedure(s) Performed: IR WITH ANESTHESIA     Patient location during evaluation: SICU Anesthesia Type: General Level of consciousness: sedated Pain management: pain level controlled Vital Signs Assessment: post-procedure vital signs reviewed and stable Respiratory status: patient remains intubated per anesthesia plan Cardiovascular status: stable Postop Assessment: no apparent nausea or vomiting Anesthetic complications: no   No notable events documented.  Last Vitals:  Vitals:   06/08/21 0450 06/08/21 0500  BP: 133/76 (!) 139/91  Pulse: 60 78  Resp: 17 20  Temp:    SpO2: 100% 100%    Last Pain:  Vitals:   06/08/21 0350  TempSrc: Temporal                 Angla Delahunt,W. EDMOND
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
## 2021-06-08 NOTE — TOC CAGE-AID Note (Signed)
Transition of Care [facility]) - CAGE-AID Screening   Patient Details  Name: Jason [name] MRN: [id number] Date of Birth: 01/27/2001  Transition of Care [facility]) CM/SW Contact:    Hewitt Shorts, RN Phone Number: 06/08/2021, 2:22 PM   CAGE-AID Screening: Substance Abuse Screening unable to be completed due to: : Patient unable to participate (Pt is intubated and sedated at this time)

## 2021-06-08 NOTE — ED Notes (Signed)
Portable xray at bedside.

## 2021-06-08 NOTE — ED Notes (Signed)
Pt to ct 

## 2021-06-08 NOTE — ED Notes (Signed)
Dr Sheliah Hatch with pt in CT.

## 2021-06-08 NOTE — Progress Notes (Addendum)
OT Cancellation Note  Patient Details Name: [name] MRN: [id number] DOB: 07/13/1876   Cancelled Treatment:    Reason Eval/Treat Not Completed: Medical issues which prohibited therapy;Active bedrest order (Maintain flat. Will return as schedule allows.)  Jakyia Gaccione M Kaya Klausing Jordy Hewins MSOT, OTR/L Acute Rehab Pager: [phone] Office: [phone] 06/08/2021, 8:40 AM
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
## 2021-06-08 NOTE — ED Notes (Signed)
NT attempted to obtain manual blood pressure on pt. Unable to auscultate a diastolic blood pressure. Systolic blood pressure heard at .

## 2021-06-08 NOTE — Transfer of Care (Signed)
Immediate Anesthesia Transfer of Care Note  Patient: Jason [name]  Procedure(s) Performed: IR WITH ANESTHESIA  Patient Location: PACU  Anesthesia Type:General  Level of Consciousness: Patient remains intubated per anesthesia plan  Airway & Oxygen Therapy: Patient remains intubated per anesthesia plan and Patient placed on Ventilator (see vital sign flow sheet for setting)  Post-op Assessment: Report given to RN and Post -op Vital signs reviewed and stable  Post vital signs: Reviewed and stable  Last Vitals:  Vitals Value Taken Time  BP    Temp    Pulse 77 06/08/21 0629  Resp 18 06/08/21 0629  SpO2 100 % 06/08/21 0629  Vitals shown include unvalidated device data.  Last Pain:  Vitals:   06/08/21 0350  TempSrc: Temporal         Complications: No notable events documented.
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
## 2021-06-08 NOTE — Anesthesia Preprocedure Evaluation (Addendum)
Anesthesia Evaluation  Patient identified by MRN, date of birth, ID band Patient unresponsive    Reviewed: Allergy & Precautions, H&P , NPO status , Patient's Chart, lab work & pertinent test results  Airway Mallampati: Intubated       Dental no notable dental hx. (+) Teeth Intact, Dental Advisory Given   Pulmonary neg pulmonary ROS,  Intubated and sedated   Pulmonary exam normal breath sounds clear to auscultation       Cardiovascular negative cardio ROS   Rhythm:Regular Rate:Normal     Neuro/Psych ICA injury negative psych ROS   GI/Hepatic negative GI ROS, Neg liver ROS,   Endo/Other  negative endocrine ROS  Renal/GU negative Renal ROS  negative genitourinary   Musculoskeletal   Abdominal   Peds  Hematology negative hematology ROS (+)   Anesthesia Other Findings   Reproductive/Obstetrics negative OB ROS                           Anesthesia Physical Anesthesia Plan  ASA: 2 and emergent  Anesthesia Plan: General   Post-op Pain Management:    Induction: Intravenous, Rapid sequence and Cricoid pressure planned  PONV Risk Score and Plan: 2 and Ondansetron and Dexamethasone  Airway Management Planned: Oral ETT  Additional Equipment: Arterial line  Intra-op Plan:   Post-operative Plan: Post-operative intubation/ventilation  Informed Consent: I have reviewed the patients History and Physical, chart, labs and discussed the procedure including the risks, benefits and alternatives for the proposed anesthesia with the patient or authorized representative who has indicated his/her understanding and acceptance.     Dental advisory given  Plan Discussed with: CRNA  Anesthesia Plan Comments:       Anesthesia Quick Evaluation
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
## 2021-06-08 NOTE — ED Notes (Signed)
Patient transported to CT 
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
## 2021-06-08 NOTE — Anesthesia Procedure Notes (Signed)
Arterial Line Insertion Start/End11/27/2022 5:00 AM, 06/08/2021 5:23 AM Performed by: Claudina Lick, CRNA, CRNA  Patient location: OR. Preanesthetic checklist: patient identified, IV checked, monitors and equipment checked, pre-op evaluation, timeout performed and anesthesia consent Emergency situation Lidocaine 1% used for infiltration Left, radial was placed Catheter size: 20 G Hand hygiene performed  and maximum sterile barriers used   Attempts: 1 Procedure performed without using ultrasound guided technique. Ultrasound Notes:anatomy identified, needle tip was noted to be adjacent to the nerve/plexus identified and no ultrasound evidence of intravascular and/or intraneural injection Following insertion, dressing applied and Biopatch. Post procedure assessment: normal and unchanged  Patient tolerated the procedure well with no immediate complications.
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
## 2021-06-08 NOTE — Procedures (Signed)
INR. 4 Vessel cerebral arteriogram. RT CFA approach. Findings. 1.Tapered stenosis of 50 to 60 % of the Rt ICA petrous cavernous junction associated with a 2.6 mmx 1.6 mm outpouching ?pseudoaneurysm . No active extravasation noted. 2.Smooth non flow limiting filling defect in the caval cavernous region. ?part of dissection+/_ thrombus. 3.Patent RT PCOM ,and ACOM  with cross filling from contralateral Lt ICA of the RT ACA and  LT MCA. 74F angioseal for CFA hemostasis.  Distal,pulses intact bilaterally. S.Kharter Brew MD.  Findings D/W  Dr Sheliah Hatch. Will observe for now given patients clinical condition  and angiographic findings. Suggest repeat CTA head and neck in 24 hrs.  Consider aspirin 81 mg if ok with referring team. Will follow. S.Liza Czerwinski MD

## 2021-06-09 ENCOUNTER — Inpatient Hospital Stay (HOSPITAL_COMMUNITY): Payer: BC Managed Care – PPO

## 2021-06-09 ENCOUNTER — Encounter (HOSPITAL_COMMUNITY): Payer: Self-pay | Admitting: Interventional Radiology

## 2021-06-09 LAB — POCT I-STAT 7, (LYTES, BLD GAS, ICA,H+H)
Acid-base deficit: 2 mmol/L (ref 0.0–2.0)
Bicarbonate: 22.4 mmol/L (ref 20.0–28.0)
Calcium, Ion: 1.22 mmol/L (ref 1.15–1.40)
HCT: 23 % — ABNORMAL LOW (ref 39.0–52.0)
Hemoglobin: 7.8 g/dL — ABNORMAL LOW (ref 13.0–17.0)
O2 Saturation: 98 %
Patient temperature: 98.4
Potassium: 3.7 mmol/L (ref 3.5–5.1)
Sodium: 141 mmol/L (ref 135–145)
TCO2: 23 mmol/L (ref 22–32)
pCO2 arterial: 35 mmHg (ref 32.0–48.0)
pH, Arterial: 7.415 (ref 7.350–7.450)
pO2, Arterial: 109 mmHg — ABNORMAL HIGH (ref 83.0–108.0)

## 2021-06-09 LAB — CBC
HCT: 27.8 % — ABNORMAL LOW (ref 39.0–52.0)
HCT: 26 % — ABNORMAL LOW (ref 39.0–52.0)
Hemoglobin: 9.5 g/dL — ABNORMAL LOW (ref 13.0–17.0)
Hemoglobin: 8.5 g/dL — ABNORMAL LOW (ref 13.0–17.0)
MCH: 30.3 pg (ref 26.0–34.0)
MCH: 29.9 pg (ref 26.0–34.0)
MCHC: 34.2 g/dL (ref 30.0–36.0)
MCHC: 32.7 g/dL (ref 30.0–36.0)
MCV: 88.5 fL (ref 80.0–100.0)
MCV: 91.5 fL (ref 80.0–100.0)
Platelets: 137 10*3/uL — ABNORMAL LOW (ref 150–400)
Platelets: 124 10*3/uL — ABNORMAL LOW (ref 150–400)
RBC: 3.14 MIL/uL — ABNORMAL LOW (ref 4.22–5.81)
RBC: 2.84 MIL/uL — ABNORMAL LOW (ref 4.22–5.81)
RDW: 14.5 % (ref 11.5–15.5)
RDW: 14.4 % (ref 11.5–15.5)
WBC: 10.2 10*3/uL (ref 4.0–10.5)
WBC: 9.7 10*3/uL (ref 4.0–10.5)
nRBC: 0 % (ref 0.0–0.2)
nRBC: 0 % (ref 0.0–0.2)

## 2021-06-09 LAB — HEMOGLOBIN AND HEMATOCRIT, BLOOD
HCT: 27.9 % — ABNORMAL LOW (ref 39.0–52.0)
Hemoglobin: 9 g/dL — ABNORMAL LOW (ref 13.0–17.0)

## 2021-06-09 LAB — BASIC METABOLIC PANEL
Anion gap: 5 (ref 5–15)
BUN: 12 mg/dL (ref 6–20)
CO2: 22 mmol/L (ref 22–32)
Calcium: 7.4 mg/dL — ABNORMAL LOW (ref 8.9–10.3)
Chloride: 112 mmol/L — ABNORMAL HIGH (ref 98–111)
Creatinine, Ser: 0.88 mg/dL (ref 0.61–1.24)
GFR, Estimated: >60 mL/min (ref >60)
Glucose, Bld: 118 mg/dL — ABNORMAL HIGH (ref 70–99)
Potassium: 3.8 mmol/L (ref 3.5–5.1)
Sodium: 139 mmol/L (ref 135–145)

## 2021-06-09 LAB — TRIGLYCERIDES: Triglycerides: 151 mg/dL — ABNORMAL HIGH (ref <150)

## 2021-06-09 LAB — GLUCOSE, CAPILLARY
Glucose-Capillary: 121 mg/dL — ABNORMAL HIGH (ref 70–99)
Glucose-Capillary: 108 mg/dL — ABNORMAL HIGH (ref 70–99)
Glucose-Capillary: 110 mg/dL — ABNORMAL HIGH (ref 70–99)

## 2021-06-09 MED ORDER — PANTOPRAZOLE SODIUM 40 MG IV SOLR
40.0000 mg | INTRAVENOUS | Status: DC
  Administered 2021-06-09 – 2021-06-16 (×8): 40 mg via INTRAVENOUS
  Filled 2021-06-09 (×8): qty 40

## 2021-06-09 MED ORDER — OXYCODONE HCL 5 MG/5ML PO SOLN
5.0000 mg | ORAL | Status: DC | PRN
Start: 2021-06-09 — End: 2021-06-27
  Administered 2021-06-10 – 2021-06-16 (×23): 10 mg
  Administered 2021-06-17: 5 mg
  Administered 2021-06-17 – 2021-06-23 (×16): 10 mg
  Administered 2021-06-25: 5 mg
  Filled 2021-06-09 (×7): qty 10
  Filled 2021-06-09 (×2): qty 5
  Filled 2021-06-09 (×34): qty 10

## 2021-06-09 MED ORDER — LACTATED RINGERS IV SOLN: INTRAVENOUS | Status: DC

## 2021-06-09 MED ORDER — PIVOT 1.5 CAL PO LIQD
1000.0000 mL | ORAL | Status: DC
  Administered 2021-06-09: 1000 mL
  Filled 2021-06-09: qty 1000

## 2021-06-09 MED ORDER — PROSOURCE TF PO LIQD
45.0000 mL | Freq: Two times a day (BID) | ORAL | Status: DC
  Administered 2021-06-09 – 2021-06-10 (×2): 45 mL
  Filled 2021-06-09 (×4): qty 45

## 2021-06-09 MED ORDER — ACETAMINOPHEN 160 MG/5ML PO SOLN
1000.0000 mg | Freq: Four times a day (QID) | ORAL | Status: DC
  Administered 2021-06-09 – 2021-06-23 (×49): 1000 mg
  Administered 2021-06-23: 650 mg
  Administered 2021-06-23 – 2021-06-27 (×15): 1000 mg
  Filled 2021-06-09 (×66): qty 40.6

## 2021-06-09 MED ORDER — VITAL HIGH PROTEIN PO LIQD: 1000.0000 mL | ORAL | Status: DC

## 2021-06-09 MED ORDER — IOHEXOL 350 MG/ML SOLN
75.0000 mL | Freq: Once | INTRAVENOUS | Status: AC | PRN
  Administered 2021-06-09: 75 mL via INTRAVENOUS

## 2021-06-09 NOTE — Progress Notes (Signed)
OT Cancellation Note  Patient Details Name: Jason [name] MRN: [id number] DOB: 2001-06-15   Cancelled Treatment:    Reason Eval/Treat Not Completed: Other (comment) (Note OT orders cancelled. Please reorder when medically appropriate. Thanks)  Burnett Corrente Sharonne Ricketts, OT/L   Acute OT Clinical Specialist Acute Rehabilitation Services Pager [phone] Office [phone]  06/09/2021, 9:20 AM

## 2021-06-09 NOTE — Progress Notes (Signed)
RT NOTE: patient transported to CT and back to room 4N28 without complications.

## 2021-06-09 NOTE — Progress Notes (Signed)
I see no hydro on the new HCT, temporal tips and rounding of 3rd vent are better. No need for ventric.

## 2021-06-09 NOTE — Progress Notes (Signed)
PT Cancellation Note  Patient Details Name: Jason [name] MRN: [id number] DOB: 10/08/2000   Cancelled Treatment:    Reason Eval/Treat Not Completed: Medical issues which prohibited therapy (PT order cancelled). Please reorder if/ when appropriate.  Thanks!  Lyanne Co, PT  Acute Rehab Services  Pager [phone] Office [phone]    Lawana Chambers Beckett Maden 06/09/2021, 9:22 AM

## 2021-06-09 NOTE — Progress Notes (Signed)
Latest Reference Range & Units 06/09/21 16:16  Sample type  ARTERIAL  pH, Arterial 7.350 - 7.450  7.415  pCO2 arterial 32.0 - 48.0 mmHg 35.0  pO2, Arterial 83.0 - 108.0 mmHg 109 (H)  TCO2 22 - 32 mmol/L 23  Acid-base deficit 0.0 - 2.0 mmol/L 2.0  Bicarbonate 20.0 - 28.0 mmol/L 22.4  O2 Saturation % 98.0  Patient temperature  98.4 F  Collection site  art line    ABG sample obtained on ventilator settings of VT: 580, RR: 18, FIO2 30%, and  PEEP:5.0.  No further changes at this time.
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
## 2021-06-09 NOTE — Progress Notes (Signed)
[facility] Radiology called this RN about head CT results. This RN notified Neurosurgery about the results.
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
## 2021-06-09 NOTE — Progress Notes (Addendum)
On the way back from CT, the IV pole got stuck in the gap of the elevator door and the dressing for the patient's R IJ central line was pulled up. The dressing was replaced after arriving back to the patient's room. Chest x-ray obtained shortly after. Will contact MD and confirm placement.   Trauma MD looked at chest x-ray and stated that IJ is still in place.

## 2021-06-09 NOTE — Progress Notes (Signed)
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   Diagnostic angiogram showed tapered stenosis of 50-60% of the right ICA petrous cavernous junction associated with 2.6 mm x 1.6 mm outpouching (question pseudoaneurysm) w/o active extravasation and smooth non flow limiting filling defect in the caval cavernous region (question part of dissection +/- thrombus) -- Dr. Corliss Skains discussed these findings with patient's parents at bedside, also discussed plans for repeat cerebral angiogram +/- intervention for right ICA dissection in the future, timing of which will depend on clinic course.   NIR will continue to follow along to determine timing of repeat cerebral angiogram -- will add CTA head/neck to CT head ordered for today to assess previous areas of concern.  Please call with questions or concerns.  Electronically Signed: Villa Herb, PA-C 06/09/2021, 9:43 AM   I spent a total of 15 Minutes at the the patient's bedside AND on the patient's hospital floor or unit, greater than 50% of which was counseling/coordinating care for right ICA dissection.

## 2021-06-09 NOTE — Procedures (Signed)
Cortrak  Tube Type:  Cortrak - 43 inches Tube Location:  Right nare Initial Placement:  Stomach Secured by: Bridle Technique Used to Measure Tube Placement:  Marking at nare/corner of mouth Cortrak Secured At:  65 cm  Cortrak Tube Team Note:  Consult received to place a Cortrak feeding tube.   X-ray is required, abdominal x-ray has been ordered by the Cortrak team. Please confirm tube placement before using the Cortrak tube.   If the tube becomes dislodged please keep the tube and contact the Cortrak team at [email or web address] ([email or web address]) for replacement.  If after hours and replacement cannot be delayed, place a NG tube and confirm placement with an abdominal x-ray.    Jourdin Connors MS, RD, LDN Please refer to AMION for RD and/or RD on-call/weekend/after hours pager   

## 2021-06-10 ENCOUNTER — Inpatient Hospital Stay (HOSPITAL_COMMUNITY): Payer: BC Managed Care – PPO

## 2021-06-10 LAB — BASIC METABOLIC PANEL
Anion gap: 5 (ref 5–15)
BUN: 10 mg/dL (ref 6–20)
CO2: 25 mmol/L (ref 22–32)
Calcium: 8 mg/dL — ABNORMAL LOW (ref 8.9–10.3)
Chloride: 110 mmol/L (ref 98–111)
Creatinine, Ser: 0.78 mg/dL (ref 0.61–1.24)
GFR, Estimated: >60 mL/min (ref >60)
Glucose, Bld: 118 mg/dL — ABNORMAL HIGH (ref 70–99)
Potassium: 3.4 mmol/L — ABNORMAL LOW (ref 3.5–5.1)
Sodium: 140 mmol/L (ref 135–145)

## 2021-06-10 LAB — CBC
HCT: 25.2 % — ABNORMAL LOW (ref 39.0–52.0)
HCT: 28 % — ABNORMAL LOW (ref 39.0–52.0)
Hemoglobin: 8.3 g/dL — ABNORMAL LOW (ref 13.0–17.0)
Hemoglobin: 8.9 g/dL — ABNORMAL LOW (ref 13.0–17.0)
MCH: 30 pg (ref 26.0–34.0)
MCH: 29.5 pg (ref 26.0–34.0)
MCHC: 32.9 g/dL (ref 30.0–36.0)
MCHC: 31.8 g/dL (ref 30.0–36.0)
MCV: 91 fL (ref 80.0–100.0)
MCV: 92.7 fL (ref 80.0–100.0)
Platelets: 126 10*3/uL — ABNORMAL LOW (ref 150–400)
Platelets: 140 10*3/uL — ABNORMAL LOW (ref 150–400)
RBC: 2.77 MIL/uL — ABNORMAL LOW (ref 4.22–5.81)
RBC: 3.02 MIL/uL — ABNORMAL LOW (ref 4.22–5.81)
RDW: 14.4 % (ref 11.5–15.5)
RDW: 14.5 % (ref 11.5–15.5)
WBC: 10.6 10*3/uL — ABNORMAL HIGH (ref 4.0–10.5)
WBC: 12.4 10*3/uL — ABNORMAL HIGH (ref 4.0–10.5)
nRBC: 0 % (ref 0.0–0.2)
nRBC: 0 % (ref 0.0–0.2)

## 2021-06-10 LAB — GLUCOSE, CAPILLARY
Glucose-Capillary: 109 mg/dL — ABNORMAL HIGH (ref 70–99)
Glucose-Capillary: 111 mg/dL — ABNORMAL HIGH (ref 70–99)
Glucose-Capillary: 111 mg/dL — ABNORMAL HIGH (ref 70–99)
Glucose-Capillary: 111 mg/dL — ABNORMAL HIGH (ref 70–99)
Glucose-Capillary: 112 mg/dL — ABNORMAL HIGH (ref 70–99)
Glucose-Capillary: 93 mg/dL (ref 70–99)

## 2021-06-10 LAB — PREPARE RBC (CROSSMATCH)

## 2021-06-10 MED ORDER — ASPIRIN 81 MG PO CHEW: 81.0000 mg | CHEWABLE_TABLET | Freq: Every day | ORAL | Status: DC

## 2021-06-10 MED ORDER — POTASSIUM CHLORIDE 20 MEQ PO PACK
PACK | ORAL | Status: AC
  Filled 2021-06-10: qty 2

## 2021-06-10 MED ORDER — PIVOT 1.5 CAL PO LIQD
1000.0000 mL | ORAL | Status: DC
Start: 2021-06-10 — End: 2021-06-17
  Administered 2021-06-12 – 2021-06-17 (×6): 1000 mL

## 2021-06-10 MED ORDER — ATROPINE SULFATE 1 MG/10ML IJ SOSY
PREFILLED_SYRINGE | INTRAMUSCULAR | Status: AC
  Filled 2021-06-10: qty 10

## 2021-06-10 MED ORDER — SODIUM CHLORIDE 0.9% IV SOLUTION: Freq: Once | INTRAVENOUS | Status: AC

## 2021-06-10 MED ORDER — POTASSIUM CHLORIDE 20 MEQ PO PACK: 20.0000 meq | PACK | Freq: Once | ORAL | Status: DC

## 2021-06-10 MED ORDER — POTASSIUM CHLORIDE 20 MEQ PO PACK
40.0000 meq | PACK | Freq: Once | ORAL | Status: AC
  Administered 2021-06-10: 40 meq

## 2021-06-10 MED ORDER — TICAGRELOR 90 MG PO TABS: 180.0000 mg | ORAL_TABLET | Freq: Once | ORAL | Status: DC

## 2021-06-10 MED ORDER — TICAGRELOR 90 MG PO TABS
90.0000 mg | ORAL_TABLET | Freq: Two times a day (BID) | ORAL | Status: DC
Start: 2021-06-11 — End: 2021-06-11

## 2021-06-10 NOTE — Progress Notes (Signed)
Patient for MRI today with intention to proceed with intervention in NIR tentatively planned for 12/1.  Discussed beginning antiplatelet medications with Dr. Janee Morn at bedside who is agreeable, per chart review and discussion with Dr. Janee Morn neurosurgery not currently planning on ventriculostomy and are ok with starting antiplatelet medication.  Orders placed for:  - Brilinta 180 mg PO/per tube x 1 tomorrow (11/30) at 1000 - Brilinta 90 mg PO/per tube BID starting tomorrow (11/30) at 2200 - ASA 81 mg PO/PR/per tube QD starting tomorrow at 1000  NIR will follow up after MRI complete and discuss intervention plans with patient's family/primary team.  Lynnette Caffey, PA-C

## 2021-06-10 NOTE — Progress Notes (Signed)
RT NOTE: RT attempted SBT on patient this AM on CPAP/PSV of 12/5 at 0755.  Patient began to have a drop in HR to as low as 39 and sats dropped to 89%.  Placed patient back on full support settings.  Will inform MD of events and attempt again when able.  RT will continue to monitor.
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
## 2021-06-10 NOTE — Progress Notes (Signed)
Patient ID: Jason [name], male   DOB: June 04, 2001, 20 y.o.   MRN: [id number] Hb 8.9 S/P 1u. MRI head done. I spoke with his parents. Violeta Gelinas, MD, MPH, FACS Please use [email or web address] to contact on call provider
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
## 2021-06-10 NOTE — Progress Notes (Signed)
He is now becoming somewhat agitated.  He is sitting up out of bed.  He appears to be somewhat left hemiparetic.  He is opening his eyes spontaneously despite the sedation.  They tried to wean the sedation somewhat he became bradycardic.  I suspect his mental status is to the point that he could tolerate extubation.  There is a question about antiplatelet agents and anticoagulation given his vascular injury.  They may try to stent his vascular injury.  At this point the risk of stroke probably outweighs the risk of worsening hemorrhage though this is difficult to quantify.  Weighing this risk, I think it is okay to move forward with intervention and antiplatelet agents.  Heparin without a bolus would be okay.  Have spoken with Dr. Janee Morn

## 2021-06-10 NOTE — Progress Notes (Signed)
RT NOTE: patient transported to MRI and back to room 4N28 without complications.

## 2021-06-11 ENCOUNTER — Encounter (HOSPITAL_COMMUNITY): Payer: Self-pay

## 2021-06-11 ENCOUNTER — Inpatient Hospital Stay (HOSPITAL_COMMUNITY): Payer: BC Managed Care – PPO

## 2021-06-11 DIAGNOSIS — S15001A Unspecified injury of right carotid artery, initial encounter: Secondary | ICD-10-CM | POA: Diagnosis not present

## 2021-06-11 DIAGNOSIS — S35329A Unspecified injury of splenic vein, initial encounter: Secondary | ICD-10-CM

## 2021-06-11 DIAGNOSIS — S15002A Unspecified injury of left carotid artery, initial encounter: Secondary | ICD-10-CM

## 2021-06-11 DIAGNOSIS — S36115A Moderate laceration of liver, initial encounter: Secondary | ICD-10-CM

## 2021-06-11 DIAGNOSIS — S37042A Minor laceration of left kidney, initial encounter: Secondary | ICD-10-CM

## 2021-06-11 DIAGNOSIS — S37899A Unspecified injury of other urinary and pelvic organ, initial encounter: Secondary | ICD-10-CM

## 2021-06-11 DIAGNOSIS — S065X0A Traumatic subdural hemorrhage without loss of consciousness, initial encounter: Secondary | ICD-10-CM

## 2021-06-11 DIAGNOSIS — R935 Abnormal findings on diagnostic imaging of other abdominal regions, including retroperitoneum: Secondary | ICD-10-CM

## 2021-06-11 LAB — GLUCOSE, CAPILLARY
Glucose-Capillary: 121 mg/dL — ABNORMAL HIGH (ref 70–99)
Glucose-Capillary: 133 mg/dL — ABNORMAL HIGH (ref 70–99)
Glucose-Capillary: 139 mg/dL — ABNORMAL HIGH (ref 70–99)
Glucose-Capillary: 150 mg/dL — ABNORMAL HIGH (ref 70–99)
Glucose-Capillary: 169 mg/dL — ABNORMAL HIGH (ref 70–99)
Glucose-Capillary: 94 mg/dL (ref 70–99)

## 2021-06-11 LAB — PREPARE FRESH FROZEN PLASMA: Unit division: 0

## 2021-06-11 LAB — CBC
HCT: 28.3 % — ABNORMAL LOW (ref 39.0–52.0)
Hemoglobin: 9.2 g/dL — ABNORMAL LOW (ref 13.0–17.0)
MCH: 30.2 pg (ref 26.0–34.0)
MCHC: 32.5 g/dL (ref 30.0–36.0)
MCV: 92.8 fL (ref 80.0–100.0)
Platelets: 144 10*3/uL — ABNORMAL LOW (ref 150–400)
RBC: 3.05 MIL/uL — ABNORMAL LOW (ref 4.22–5.81)
RDW: 14.6 % (ref 11.5–15.5)
WBC: 12 10*3/uL — ABNORMAL HIGH (ref 4.0–10.5)
nRBC: 0 % (ref 0.0–0.2)

## 2021-06-11 LAB — BASIC METABOLIC PANEL
Anion gap: 4 — ABNORMAL LOW (ref 5–15)
BUN: 13 mg/dL (ref 6–20)
CO2: 27 mmol/L (ref 22–32)
Calcium: 7.8 mg/dL — ABNORMAL LOW (ref 8.9–10.3)
Chloride: 106 mmol/L (ref 98–111)
Creatinine, Ser: 0.73 mg/dL (ref 0.61–1.24)
GFR, Estimated: >60 mL/min (ref >60)
Glucose, Bld: 161 mg/dL — ABNORMAL HIGH (ref 70–99)
Potassium: 3.8 mmol/L (ref 3.5–5.1)
Sodium: 137 mmol/L (ref 135–145)

## 2021-06-11 LAB — BPAM FFP
Blood Product Expiration Date: 202212022359
Blood Product Expiration Date: 202212022359
Blood Product Expiration Date: 202212022359
Blood Product Expiration Date: 202212022359
ISSUE DATE / TIME: 202211270547
ISSUE DATE / TIME: 202211270547
ISSUE DATE / TIME: 202211291819
ISSUE DATE / TIME: 202211291819
Unit Type and Rh: 5100
Unit Type and Rh: 5100
Unit Type and Rh: 5100
Unit Type and Rh: 5100

## 2021-06-11 MED ORDER — TICAGRELOR 90 MG PO TABS
90.0000 mg | ORAL_TABLET | Freq: Two times a day (BID) | ORAL | Status: DC
  Administered 2021-06-12: 90 mg via NASOGASTRIC
  Filled 2021-06-11: qty 1

## 2021-06-11 MED ORDER — ASPIRIN 81 MG PO CHEW
81.0000 mg | CHEWABLE_TABLET | Freq: Every day | ORAL | Status: DC
  Administered 2021-06-12: 81 mg via NASOGASTRIC

## 2021-06-11 MED ORDER — BISACODYL 10 MG RE SUPP
10.0000 mg | Freq: Every day | RECTAL | Status: DC | PRN
  Administered 2021-06-11: 10 mg via RECTAL
  Filled 2021-06-11 (×2): qty 1

## 2021-06-11 MED ORDER — ONDANSETRON HCL 4 MG/2ML IJ SOLN
4.0000 mg | Freq: Four times a day (QID) | INTRAMUSCULAR | Status: DC | PRN
  Administered 2021-06-24 (×3): 4 mg via INTRAVENOUS
  Filled 2021-06-11 (×3): qty 2

## 2021-06-11 MED ORDER — TICAGRELOR 90 MG PO TABS
180.0000 mg | ORAL_TABLET | ORAL | Status: AC
  Administered 2021-06-11: 180 mg via NASOGASTRIC
  Filled 2021-06-11: qty 2

## 2021-06-11 MED ORDER — LORAZEPAM 2 MG/ML IJ SOLN
INTRAMUSCULAR | Status: AC
  Administered 2021-06-11: 2 mg
  Filled 2021-06-11: qty 1

## 2021-06-11 MED ORDER — POLYETHYLENE GLYCOL 3350 17 G PO PACK
17.0000 g | PACK | Freq: Two times a day (BID) | ORAL | Status: DC
  Administered 2021-06-11 – 2021-06-13 (×4): 17 g
  Filled 2021-06-11 (×4): qty 1

## 2021-06-11 MED ORDER — TICAGRELOR 90 MG PO TABS: 90.0000 mg | ORAL_TABLET | Freq: Two times a day (BID) | ORAL | Status: DC

## 2021-06-11 MED ORDER — ONDANSETRON HCL 4 MG/2ML IJ SOLN
INTRAMUSCULAR | Status: AC
  Administered 2021-06-11: 4 mg via INTRAVENOUS
  Filled 2021-06-11: qty 2

## 2021-06-11 MED ORDER — POTASSIUM CHLORIDE 20 MEQ PO PACK
20.0000 meq | PACK | Freq: Once | ORAL | Status: AC
  Administered 2021-06-11: 20 meq

## 2021-06-11 NOTE — Progress Notes (Signed)
CT images reviewed and d/w IR, Dr. Grace Isaac, and we feel there is not any significant change to any of the abdominal/RP organs in question to warrant further investigation with angiography prior to carotid stenting. Increased peri-hepatic fluid, but liver was the lowest grade injury. Suspect this is at least partially due to third spacing of fluid based on Hounsfield units. Patient okay for DOAC/DAPT. Will need to continue close monitoring for hypotension/anemia and have a low threshold for repeat imaging and/or intervention with AE/ex-lap.   Additional critical care time:  Diamantina Monks, MD General and Trauma Surgery [facility]
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
## 2021-06-11 NOTE — Progress Notes (Signed)
RT assisted RN with transportation of this pt from 4N28 to CT while on full ventilatory support. Pt tolerated well with SVS no complications. RN at bedside now.
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
## 2021-06-11 NOTE — Progress Notes (Signed)
RT and RN X 2 transported patient to CT and back to [id number] without event. 

## 2021-06-11 NOTE — Progress Notes (Signed)
Was able to reduce sedation medication throughout the night shift with pt tolerating ventilator well. When pt is not being stimulated or has a build of secretions in ETT pt resting very well, HR remaining in the 40's and 50's. Pt responsive to voice and follows commands, however pt does not remain calm when stimulated; moving all extremities and sitting upright in the bed and including purposeful movement to reach for ETT.

## 2021-06-11 NOTE — Progress Notes (Signed)
While assessing pt a-line with RT, minimal stimulation to the pt, he suddenly became very agitated/restless/strong purposeful movements in attempt to grasp the ETT. Staff at the bedside with pt mother, unable to reassure pt and get him back to a calm state. Pt still following simple commands at this time. Pupils noted to be obvious difference in size; R 6 L 3 both moderately brisk reactive. PRN meds were given for RASS. Dr. Dwain Sarna notified of pupil changes. STAT CT of head was ordered.
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
## 2021-06-11 NOTE — Progress Notes (Signed)
After review with Dr. Bedelia Person, Dr. Grace Isaac and Dr. Corliss Skains the plan is to proceed with image guided cerebral angiogram with possible angioplasty, possible embolization, possible stent placement for dissection and pseudoaneurysm of right ICA with general anesthesia 06/12/2021.   For H&P, see note 06/11/2021  Risks and benefits of cerebral angiogram with intervention were discussed with the patient including, but not limited to bleeding, infection, vascular injury, contrast induced renal failure, stroke or even death.  This interventional procedure involves the use of X-rays and because of the nature of the planned procedure, it is possible that we will have prolonged use of X-ray fluoroscopy.  Potential radiation risks to you include (but are not limited to) the following: - A slightly elevated risk for cancer  several years later in life. This risk is typically less than 0.5% percent. This risk is low in comparison to the normal incidence of human cancer, which is 33% for women and 50% for men according to the [employer]. - Radiation induced injury can include skin redness, resembling a rash, tissue breakdown / ulcers and hair loss (which can be temporary or permanent).   The likelihood of either of these occurring depends on the difficulty of the procedure and whether you are sensitive to radiation due to previous procedures, disease, or genetic conditions.   IF your procedure requires a prolonged use of radiation, you will be notified and given written instructions for further action.  It is your responsibility to monitor the irradiated area for the 2 weeks following the procedure and to notify your physician if you are concerned that you have suffered a radiation induced injury.    All of the patient's mother's  questions were answered, patient's mother is agreeable to proceed.  Consent signed and in IR.   Alex Gardener, AGNP-BC 06/11/2021, 3:47 PM

## 2021-06-12 ENCOUNTER — Inpatient Hospital Stay (HOSPITAL_COMMUNITY): Payer: BC Managed Care – PPO

## 2021-06-12 ENCOUNTER — Inpatient Hospital Stay (HOSPITAL_COMMUNITY): Payer: BC Managed Care – PPO | Admitting: Certified Registered Nurse Anesthetist

## 2021-06-12 ENCOUNTER — Other Ambulatory Visit (HOSPITAL_COMMUNITY): Payer: Self-pay | Admitting: Emergency Medicine

## 2021-06-12 ENCOUNTER — Encounter (HOSPITAL_COMMUNITY): Admission: EM | Disposition: A | Discharge status: Rehabilitation Facility | Payer: Self-pay | Source: Home / Self Care

## 2021-06-12 ENCOUNTER — Other Ambulatory Visit (HOSPITAL_COMMUNITY): Payer: Self-pay

## 2021-06-12 ENCOUNTER — Other Ambulatory Visit (HOSPITAL_COMMUNITY): Payer: Self-pay | Admitting: Interventional Radiology

## 2021-06-12 DIAGNOSIS — I7771 Dissection of carotid artery: Secondary | ICD-10-CM | POA: Diagnosis present

## 2021-06-12 HISTORY — PX: IR CT HEAD LTD: IMG2386

## 2021-06-12 HISTORY — PX: IR ANGIO INTRA EXTRACRAN SEL INTERNAL CAROTID UNI R MOD SED: IMG5362

## 2021-06-12 HISTORY — PX: RADIOLOGY WITH ANESTHESIA: SHX6223

## 2021-06-12 HISTORY — PX: IR TRANSCATH/EMBOLIZ: IMG695

## 2021-06-12 LAB — BASIC METABOLIC PANEL
Anion gap: 6 (ref 5–15)
BUN: 13 mg/dL (ref 6–20)
CO2: 28 mmol/L (ref 22–32)
Calcium: 8.2 mg/dL — ABNORMAL LOW (ref 8.9–10.3)
Chloride: 106 mmol/L (ref 98–111)
Creatinine, Ser: 0.63 mg/dL (ref 0.61–1.24)
GFR, Estimated: >60 mL/min (ref >60)
Glucose, Bld: 133 mg/dL — ABNORMAL HIGH (ref 70–99)
Potassium: 3.9 mmol/L (ref 3.5–5.1)
Sodium: 140 mmol/L (ref 135–145)

## 2021-06-12 LAB — CBC
HCT: 31.4 % — ABNORMAL LOW (ref 39.0–52.0)
Hemoglobin: 10.1 g/dL — ABNORMAL LOW (ref 13.0–17.0)
MCH: 30 pg (ref 26.0–34.0)
MCHC: 32.2 g/dL (ref 30.0–36.0)
MCV: 93.2 fL (ref 80.0–100.0)
Platelets: 190 10*3/uL (ref 150–400)
RBC: 3.37 MIL/uL — ABNORMAL LOW (ref 4.22–5.81)
RDW: 14 % (ref 11.5–15.5)
WBC: 16 10*3/uL — ABNORMAL HIGH (ref 4.0–10.5)
nRBC: 0.3 % — ABNORMAL HIGH (ref 0.0–0.2)

## 2021-06-12 LAB — GLUCOSE, CAPILLARY
Glucose-Capillary: 105 mg/dL — ABNORMAL HIGH (ref 70–99)
Glucose-Capillary: 116 mg/dL — ABNORMAL HIGH (ref 70–99)
Glucose-Capillary: 119 mg/dL — ABNORMAL HIGH (ref 70–99)
Glucose-Capillary: 93 mg/dL (ref 70–99)

## 2021-06-12 LAB — POCT I-STAT 7, (LYTES, BLD GAS, ICA,H+H)
Acid-Base Excess: 1 mmol/L (ref 0.0–2.0)
Bicarbonate: 25.5 mmol/L (ref 20.0–28.0)
Calcium, Ion: 1.18 mmol/L (ref 1.15–1.40)
HCT: 24 % — ABNORMAL LOW (ref 39.0–52.0)
Hemoglobin: 8.2 g/dL — ABNORMAL LOW (ref 13.0–17.0)
O2 Saturation: 91 %
Potassium: 4.1 mmol/L (ref 3.5–5.1)
Sodium: 139 mmol/L (ref 135–145)
TCO2: 27 mmol/L (ref 22–32)
pCO2 arterial: 41.5 mmHg (ref 32.0–48.0)
pH, Arterial: 7.396 (ref 7.350–7.450)
pO2, Arterial: 61 mmHg — ABNORMAL LOW (ref 83.0–108.0)

## 2021-06-12 LAB — TYPE AND SCREEN
ABO/RH(D): O POS
Antibody Screen: NEGATIVE
Unit division: 0
Unit division: 0
Unit division: 0
Unit division: 0

## 2021-06-12 LAB — BPAM RBC
Blood Product Expiration Date: 202212152359
Blood Product Expiration Date: 202212162359
Blood Product Expiration Date: 202212212359
Blood Product Expiration Date: 202212212359
ISSUE DATE / TIME: 202211270529
ISSUE DATE / TIME: 202211271152
ISSUE DATE / TIME: 202211271152
ISSUE DATE / TIME: 202211290926
Unit Type and Rh: 5100
Unit Type and Rh: 5100
Unit Type and Rh: 5100
Unit Type and Rh: 5100

## 2021-06-12 LAB — HEPARIN LEVEL (UNFRACTIONATED): Heparin Unfractionated: <0.1 IU/mL — ABNORMAL LOW (ref 0.30–0.70)

## 2021-06-12 LAB — TRIGLYCERIDES: Triglycerides: 74 mg/dL (ref <150)

## 2021-06-12 SURGERY — IR WITH ANESTHESIA
Anesthesia: General

## 2021-06-12 MED ORDER — HEPARIN SODIUM (PORCINE) 1000 UNIT/ML IJ SOLN
INTRAMUSCULAR | Status: DC | PRN
  Administered 2021-06-12: 3000 [IU] via INTRAVENOUS

## 2021-06-12 MED ORDER — SODIUM CHLORIDE 0.9 % IV SOLN: INTRAVENOUS | Status: DC

## 2021-06-12 MED ORDER — SODIUM CHLORIDE (PF) 0.9 % IJ SOLN
INTRAVENOUS | Status: AC | PRN
  Administered 2021-06-12 (×2): 25 ug via INTRA_ARTERIAL

## 2021-06-12 MED ORDER — LIDOCAINE HCL 1 % IJ SOLN
INTRAMUSCULAR | Status: AC
  Filled 2021-06-12: qty 20

## 2021-06-12 MED ORDER — CEFAZOLIN SODIUM-DEXTROSE 2-3 GM-%(50ML) IV SOLR
INTRAVENOUS | Status: DC | PRN
  Administered 2021-06-12: 2 g via INTRAVENOUS

## 2021-06-12 MED ORDER — HEPARIN (PORCINE) 25000 UT/250ML-% IV SOLN: 900.0000 [IU]/h | INTRAVENOUS | Status: AC

## 2021-06-12 MED ORDER — IOHEXOL 300 MG/ML  SOLN
100.0000 mL | Freq: Once | INTRAMUSCULAR | Status: AC | PRN
  Administered 2021-06-17: 100 mL via INTRA_ARTERIAL

## 2021-06-12 MED ORDER — IOHEXOL 300 MG/ML  SOLN
100.0000 mL | Freq: Once | INTRAMUSCULAR | Status: AC | PRN
  Administered 2021-06-12: 65 mL via INTRA_ARTERIAL

## 2021-06-12 MED ORDER — MIDAZOLAM HCL 2 MG/2ML IJ SOLN
INTRAMUSCULAR | Status: DC | PRN
  Administered 2021-06-12: 2 mg via INTRAVENOUS
  Administered 2021-06-12: 4 mg via INTRAVENOUS

## 2021-06-12 MED ORDER — HEPARIN (PORCINE) 25000 UT/250ML-% IV SOLN
750.0000 [IU]/h | INTRAVENOUS | Status: DC
  Administered 2021-06-12: 750 [IU]/h via INTRAVENOUS
  Filled 2021-06-12: qty 250

## 2021-06-12 MED ORDER — MIDAZOLAM HCL 2 MG/2ML IJ SOLN
2.0000 mg | INTRAMUSCULAR | Status: DC | PRN
  Administered 2021-06-12 – 2021-06-20 (×15): 2 mg via INTRAVENOUS
  Filled 2021-06-12 (×15): qty 2

## 2021-06-12 MED ORDER — PROPOFOL 10 MG/ML IV BOLUS
INTRAVENOUS | Status: DC | PRN
  Administered 2021-06-12: 80 mg via INTRAVENOUS
  Administered 2021-06-12: 30 mg via INTRAVENOUS

## 2021-06-12 MED ORDER — HEPARIN (PORCINE) 25000 UT/250ML-% IV SOLN: 500.0000 [IU]/h | INTRAVENOUS | Status: DC

## 2021-06-12 MED ORDER — ACETAMINOPHEN 325 MG PO TABS: 650.0000 mg | ORAL_TABLET | ORAL | Status: DC | PRN

## 2021-06-12 MED ORDER — TICAGRELOR 90 MG PO TABS
90.0000 mg | ORAL_TABLET | Freq: Once | ORAL | Status: AC
  Administered 2021-06-12: 90 mg
  Filled 2021-06-12: qty 1

## 2021-06-12 MED ORDER — NITROGLYCERIN 1 MG/10 ML FOR IR/CATH LAB
INTRA_ARTERIAL | Status: AC
  Filled 2021-06-12: qty 10

## 2021-06-12 MED ORDER — TICAGRELOR 90 MG PO TABS
90.0000 mg | ORAL_TABLET | Freq: Two times a day (BID) | ORAL | Status: DC
  Administered 2021-06-12 – 2021-06-18 (×12): 90 mg
  Filled 2021-06-12 (×11): qty 1

## 2021-06-12 MED ORDER — ACETAMINOPHEN 650 MG RE SUPP: 650.0000 mg | RECTAL | Status: DC | PRN

## 2021-06-12 MED ORDER — ASPIRIN 81 MG PO CHEW
81.0000 mg | CHEWABLE_TABLET | Freq: Every day | ORAL | Status: DC
  Administered 2021-06-13 – 2021-06-18 (×6): 81 mg
  Filled 2021-06-12 (×7): qty 1

## 2021-06-12 MED ORDER — PROTAMINE SULFATE 10 MG/ML IV SOLN
INTRAVENOUS | Status: DC | PRN
  Administered 2021-06-12: 5 mg via INTRAVENOUS

## 2021-06-12 MED ORDER — ASPIRIN 81 MG PO CHEW
81.0000 mg | CHEWABLE_TABLET | Freq: Every day | ORAL | Status: DC
  Filled 2021-06-12: qty 1

## 2021-06-12 MED ORDER — TICAGRELOR 90 MG PO TABS
90.0000 mg | ORAL_TABLET | Freq: Two times a day (BID) | ORAL | Status: DC
  Filled 2021-06-12 (×2): qty 1

## 2021-06-12 MED ORDER — CLEVIDIPINE BUTYRATE 0.5 MG/ML IV EMUL
INTRAVENOUS | Status: DC | PRN
  Administered 2021-06-12: 5 mg/h via INTRAVENOUS

## 2021-06-12 MED ORDER — CLEVIDIPINE BUTYRATE 0.5 MG/ML IV EMUL
0.0000 mg/h | INTRAVENOUS | Status: DC
  Administered 2021-06-12: 10 mg/h via INTRAVENOUS
  Administered 2021-06-12: 4 mg/h via INTRAVENOUS
  Administered 2021-06-12: 15 mg/h via INTRAVENOUS
  Administered 2021-06-13: 6 mg/h via INTRAVENOUS
  Filled 2021-06-12 (×3): qty 50

## 2021-06-12 MED ORDER — ROCURONIUM BROMIDE 100 MG/10ML IV SOLN
INTRAVENOUS | Status: DC | PRN
  Administered 2021-06-12: 40 mg via INTRAVENOUS
  Administered 2021-06-12: 60 mg via INTRAVENOUS
  Administered 2021-06-12: 40 mg via INTRAVENOUS
  Administered 2021-06-12: 20 mg via INTRAVENOUS

## 2021-06-12 MED ORDER — ACETAMINOPHEN 160 MG/5ML PO SOLN
650.0000 mg | ORAL | Status: DC | PRN
  Filled 2021-06-12 (×2): qty 20.3

## 2021-06-12 MED ORDER — FENTANYL CITRATE (PF) 100 MCG/2ML IJ SOLN
INTRAMUSCULAR | Status: DC | PRN
  Administered 2021-06-12: 100 ug via INTRAVENOUS

## 2021-06-12 MED ORDER — CEFAZOLIN SODIUM-DEXTROSE 2-4 GM/100ML-% IV SOLN
INTRAVENOUS | Status: AC
  Filled 2021-06-12: qty 100

## 2021-06-12 NOTE — Anesthesia Procedure Notes (Signed)
Date/Time: 06/12/2021 1:00 PM Performed by: Colon Flattery, CRNA Pre-anesthesia Checklist: Patient identified, Emergency Drugs available, Patient being monitored and Suction available Patient Re-evaluated:Patient Re-evaluated prior to induction Oxygen Delivery Method: Circle system utilized Preoxygenation: Pre-oxygenation with 100% oxygen Induction Type: Combination inhalational/ intravenous induction Placement Confirmation: positive ETCO2 and breath sounds checked- equal and bilateral Dental Injury: Teeth and Oropharynx as per pre-operative assessment  Comments: Patient transported from 4N with ETT in place

## 2021-06-12 NOTE — Progress Notes (Signed)
Patients art line pressure remains above 150 SBP despite cleviprex on max dose and increase in sedation. Per Dr Corliss Skains can titrate based off cuff pressure.

## 2021-06-12 NOTE — Progress Notes (Signed)
RT and RN X 2 transported patient to CT and back to 4N28 without event.
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
## 2021-06-12 NOTE — Sedation Documentation (Signed)
Right fem sheath removed. 8 Fr. Angioseal deployed

## 2021-06-12 NOTE — Procedures (Signed)
INR.  S/P RT common carotid artrriogram.  RT CFA approach .  S/P placement of x1 82mm x 14 mm Pipeline  shield flow diverter across enlarging RT ICA  petrous cav junction pseudoaneurysm.  Post CT brain NO ICH changes or mass effect. 52F angioseal used for RT CFA hemostasis. Distal pulses intact. S.Havoc Sanluis MD
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
## 2021-06-12 NOTE — Transfer of Care (Signed)
Immediate Anesthesia Transfer of Care Note  Patient: Mollie Germany  Procedure(s) Performed: EMOBILIZATION  Patient Location: ICU  Anesthesia Type:General  Level of Consciousness: sedated and Patient remains intubated per anesthesia plan  Airway & Oxygen Therapy: Patient remains intubated per anesthesia plan and Patient placed on Ventilator (see vital sign flow sheet for setting)  Post-op Assessment: Report given to RN and Post -op Vital signs reviewed and stable  Post vital signs: Reviewed and stable  Last Vitals:  Vitals Value Taken Time  BP 109/68 06/12/21 1500  Temp    Pulse 62 06/12/21 1508  Resp 18 06/12/21 1508  SpO2 100 % 06/12/21 1508  Vitals shown include unvalidated device data.  Last Pain:  Vitals:   06/12/21 0800  TempSrc: Axillary         Complications: No notable events documented.
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
## 2021-06-12 NOTE — Anesthesia Procedure Notes (Signed)
Arterial Line Insertion Start/End12/07/2020 12:45 PM, 06/12/2021 12:50 PM Performed by: Colon Flattery, CRNA, CRNA  Patient location: OR. Preanesthetic checklist: patient identified, IV checked, site marked, risks and benefits discussed, surgical consent, monitors and equipment checked, pre-op evaluation, timeout performed and anesthesia consent Patient sedated Left, radial was placed Catheter size: 20 G Hand hygiene performed  and maximum sterile barriers used   Attempts: 1 Procedure performed without using ultrasound guided technique. Following insertion, Biopatch and dressing applied. Patient tolerated the procedure well with no immediate complications.
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
## 2021-06-12 NOTE — Anesthesia Postprocedure Evaluation (Signed)
Anesthesia Post Note  Patient: Geovani Tootle  Procedure(s) Performed: EMOBILIZATION     Patient location during evaluation: SICU Anesthesia Type: General Level of consciousness: sedated Pain management: pain level controlled Vital Signs Assessment: post-procedure vital signs reviewed and stable Respiratory status: patient remains intubated per anesthesia plan Cardiovascular status: stable Postop Assessment: no apparent nausea or vomiting Anesthetic complications: no   No notable events documented.  Last Vitals:  Vitals:   06/12/21 1131 06/12/21 1505  BP: 138/71   Pulse: 67 64  Resp: 18 18  Temp:    SpO2: 95% 97%    Last Pain:  Vitals:   06/12/21 0800  TempSrc: Axillary                 Cecile Hearing

## 2021-06-12 NOTE — Progress Notes (Signed)
Pt rested well throughout the night; following simple commands wiggle toes/thumbs up with only low/moderate agitation compared to previous episodes. Pt pupils remained slightly unequal R4 L3 and moderately brisk. Around 0340 pt had sudden onset of restless agitation needing PRN Versed and Fentanyl bolus. Pupil assessment at that time revealed R at 9, significantly larger than L 3 and now unreactive. Pt able to move all extremities with great strength. Pt of agitation to which nursing unable to get pt to listen and follow simple commands as before. Neurosurgery paged regarding this significant change in pupils; STAT CT ordered. Upon recheck at this time pt pupils remain unequal R 6 and now reactive L 3 and reactive, both moderately brisk. Pt calm in the bed, vitals stable at this time.

## 2021-06-13 ENCOUNTER — Inpatient Hospital Stay (HOSPITAL_COMMUNITY): Payer: BC Managed Care – PPO

## 2021-06-13 ENCOUNTER — Encounter (HOSPITAL_COMMUNITY): Payer: Self-pay | Admitting: Interventional Radiology

## 2021-06-13 LAB — CBC WITH DIFFERENTIAL/PLATELET
Abs Immature Granulocytes: 0.13 10*3/uL — ABNORMAL HIGH (ref 0.00–0.07)
Basophils Absolute: 0 10*3/uL (ref 0.0–0.1)
Basophils Relative: 0 %
Eosinophils Absolute: 0.4 10*3/uL (ref 0.0–0.5)
Eosinophils Relative: 3 %
HCT: 28 % — ABNORMAL LOW (ref 39.0–52.0)
Hemoglobin: 8.8 g/dL — ABNORMAL LOW (ref 13.0–17.0)
Immature Granulocytes: 1 %
Lymphocytes Relative: 16 %
Lymphs Abs: 1.9 10*3/uL (ref 0.7–4.0)
MCH: 29.1 pg (ref 26.0–34.0)
MCHC: 31.4 g/dL (ref 30.0–36.0)
MCV: 92.7 fL (ref 80.0–100.0)
Monocytes Absolute: 1.3 10*3/uL — ABNORMAL HIGH (ref 0.1–1.0)
Monocytes Relative: 11 %
Neutro Abs: 8 10*3/uL — ABNORMAL HIGH (ref 1.7–7.7)
Neutrophils Relative %: 69 %
Platelets: 219 10*3/uL (ref 150–400)
RBC: 3.02 MIL/uL — ABNORMAL LOW (ref 4.22–5.81)
RDW: 13.4 % (ref 11.5–15.5)
WBC: 11.8 10*3/uL — ABNORMAL HIGH (ref 4.0–10.5)
nRBC: 0 % (ref 0.0–0.2)

## 2021-06-13 LAB — POCT I-STAT 7, (LYTES, BLD GAS, ICA,H+H)
Acid-Base Excess: 6 mmol/L — ABNORMAL HIGH (ref 0.0–2.0)
Acid-Base Excess: 5 mmol/L — ABNORMAL HIGH (ref 0.0–2.0)
Acid-Base Excess: 3 mmol/L — ABNORMAL HIGH (ref 0.0–2.0)
Bicarbonate: 32.3 mmol/L — ABNORMAL HIGH (ref 20.0–28.0)
Bicarbonate: 29.8 mmol/L — ABNORMAL HIGH (ref 20.0–28.0)
Bicarbonate: 29.5 mmol/L — ABNORMAL HIGH (ref 20.0–28.0)
Calcium, Ion: 1.17 mmol/L (ref 1.15–1.40)
Calcium, Ion: 1.16 mmol/L (ref 1.15–1.40)
Calcium, Ion: 1.16 mmol/L (ref 1.15–1.40)
HCT: 27 % — ABNORMAL LOW (ref 39.0–52.0)
HCT: 25 % — ABNORMAL LOW (ref 39.0–52.0)
HCT: 29 % — ABNORMAL LOW (ref 39.0–52.0)
Hemoglobin: 9.2 g/dL — ABNORMAL LOW (ref 13.0–17.0)
Hemoglobin: 8.5 g/dL — ABNORMAL LOW (ref 13.0–17.0)
Hemoglobin: 9.9 g/dL — ABNORMAL LOW (ref 13.0–17.0)
O2 Saturation: 97 %
O2 Saturation: 99 %
O2 Saturation: 95 %
Patient temperature: 98.3
Patient temperature: 99.2
Potassium: 3.9 mmol/L (ref 3.5–5.1)
Potassium: 3.8 mmol/L (ref 3.5–5.1)
Potassium: 3.9 mmol/L (ref 3.5–5.1)
Sodium: 140 mmol/L (ref 135–145)
Sodium: 140 mmol/L (ref 135–145)
Sodium: 141 mmol/L (ref 135–145)
TCO2: 34 mmol/L — ABNORMAL HIGH (ref 22–32)
TCO2: 31 mmol/L (ref 22–32)
TCO2: 31 mmol/L (ref 22–32)
pCO2 arterial: 53.4 mmHg — ABNORMAL HIGH (ref 32.0–48.0)
pCO2 arterial: 45.3 mmHg (ref 32.0–48.0)
pCO2 arterial: 55 mmHg — ABNORMAL HIGH (ref 32.0–48.0)
pH, Arterial: 7.389 (ref 7.350–7.450)
pH, Arterial: 7.425 (ref 7.350–7.450)
pH, Arterial: 7.339 — ABNORMAL LOW (ref 7.350–7.450)
pO2, Arterial: 90 mmHg (ref 83.0–108.0)
pO2, Arterial: 115 mmHg — ABNORMAL HIGH (ref 83.0–108.0)
pO2, Arterial: 84 mmHg (ref 83.0–108.0)

## 2021-06-13 LAB — BASIC METABOLIC PANEL
Anion gap: 4 — ABNORMAL LOW (ref 5–15)
BUN: 11 mg/dL (ref 6–20)
CO2: 30 mmol/L (ref 22–32)
Calcium: 8 mg/dL — ABNORMAL LOW (ref 8.9–10.3)
Chloride: 104 mmol/L (ref 98–111)
Creatinine, Ser: 0.63 mg/dL (ref 0.61–1.24)
GFR, Estimated: >60 mL/min (ref >60)
Glucose, Bld: 128 mg/dL — ABNORMAL HIGH (ref 70–99)
Potassium: 4 mmol/L (ref 3.5–5.1)
Sodium: 138 mmol/L (ref 135–145)

## 2021-06-13 LAB — GLUCOSE, CAPILLARY
Glucose-Capillary: 104 mg/dL — ABNORMAL HIGH (ref 70–99)
Glucose-Capillary: 125 mg/dL — ABNORMAL HIGH (ref 70–99)
Glucose-Capillary: 130 mg/dL — ABNORMAL HIGH (ref 70–99)
Glucose-Capillary: 131 mg/dL — ABNORMAL HIGH (ref 70–99)
Glucose-Capillary: 140 mg/dL — ABNORMAL HIGH (ref 70–99)
Glucose-Capillary: 155 mg/dL — ABNORMAL HIGH (ref 70–99)

## 2021-06-13 LAB — POCT ACTIVATED CLOTTING TIME
Activated Clotting Time: 197 seconds
Activated Clotting Time: 215 seconds
Activated Clotting Time: 389 seconds

## 2021-06-13 MED ORDER — LIDOCAINE HCL 1 % IJ SOLN
INTRAMUSCULAR | Status: AC
  Filled 2021-06-13: qty 20

## 2021-06-13 MED ORDER — QUETIAPINE FUMARATE 25 MG PO TABS
50.0000 mg | ORAL_TABLET | Freq: Two times a day (BID) | ORAL | Status: DC
  Administered 2021-06-13 – 2021-06-14 (×4): 50 mg
  Filled 2021-06-13 (×4): qty 2

## 2021-06-13 MED ORDER — FENTANYL CITRATE (PF) 2500 MCG/50ML IJ SOLN
50.0000 ug/h | Status: DC
  Administered 2021-06-13: 125 ug/h via INTRAVENOUS
  Administered 2021-06-14: 175 ug/h via INTRAVENOUS
  Administered 2021-06-15 – 2021-06-16 (×2): 300 ug/h via INTRAVENOUS
  Administered 2021-06-17 – 2021-06-19 (×2): 150 ug/h via INTRAVENOUS
  Administered 2021-06-20: 200 ug/h via INTRAVENOUS
  Administered 2021-06-21 – 2021-06-23 (×2): 150 ug/h via INTRAVENOUS
  Filled 2021-06-13: qty 100
  Filled 2021-06-13: qty 50
  Filled 2021-06-13 (×11): qty 100

## 2021-06-13 MED ORDER — DEXMEDETOMIDINE HCL IN NACL 400 MCG/100ML IV SOLN
0.4000 ug/kg/h | INTRAVENOUS | Status: DC
  Administered 2021-06-13 (×2): 0.4 ug/kg/h via INTRAVENOUS
  Administered 2021-06-14 (×2): 1 ug/kg/h via INTRAVENOUS
  Administered 2021-06-14: 1.2 ug/kg/h via INTRAVENOUS
  Administered 2021-06-14 (×3): 1 ug/kg/h via INTRAVENOUS
  Administered 2021-06-15: 1.2 ug/kg/h via INTRAVENOUS
  Administered 2021-06-15 (×2): 1 ug/kg/h via INTRAVENOUS
  Filled 2021-06-13: qty 200
  Filled 2021-06-13 (×3): qty 100
  Filled 2021-06-13: qty 200
  Filled 2021-06-13 (×4): qty 100

## 2021-06-13 MED ORDER — FUROSEMIDE 10 MG/ML IJ SOLN
20.0000 mg | Freq: Once | INTRAMUSCULAR | Status: AC
  Administered 2021-06-13: 20 mg via INTRAVENOUS
  Filled 2021-06-13: qty 2

## 2021-06-13 NOTE — Progress Notes (Signed)
Patient seen at for therapeutic thoracentesis. [place] limited  shows trace amount of right pleural fluid and no pleural fluid noted to be on the left side.  Insufficient to perform a safe thoracentesis. Procedure not performed.

## 2021-06-13 NOTE — Progress Notes (Signed)
Patient with increasing O2 req'ts. ABG c/w moderate ARDS, P:F 115 on 100% FiO2 and 12 of PEEP. Resp cx sent. Eval by IR re: suspect L sided effusion and no fluid available to tap. CXR repeated and reviewed by me. Lasix 20mg  ordered and volume intake minimized.   Critical care time:  , MD General and Trauma Surgery [facility] Surgery
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
## 2021-06-13 NOTE — Progress Notes (Signed)
Rt called to pts room, tube found to be at 19 Rt advanced tube back to 24 at this time. RN ordering CXR to confirm tube placement. Rt will continue to monitor.

## 2021-06-14 ENCOUNTER — Inpatient Hospital Stay (HOSPITAL_COMMUNITY): Payer: BC Managed Care – PPO

## 2021-06-14 LAB — POCT I-STAT 7, (LYTES, BLD GAS, ICA,H+H)
Acid-Base Excess: 8 mmol/L — ABNORMAL HIGH (ref 0.0–2.0)
Bicarbonate: 32.7 mmol/L — ABNORMAL HIGH (ref 20.0–28.0)
Calcium, Ion: 1.18 mmol/L (ref 1.15–1.40)
HCT: 25 % — ABNORMAL LOW (ref 39.0–52.0)
Hemoglobin: 8.5 g/dL — ABNORMAL LOW (ref 13.0–17.0)
O2 Saturation: 100 %
Patient temperature: 101.2
Potassium: 4.1 mmol/L (ref 3.5–5.1)
Sodium: 141 mmol/L (ref 135–145)
TCO2: 34 mmol/L — ABNORMAL HIGH (ref 22–32)
pCO2 arterial: 50.8 mmHg — ABNORMAL HIGH (ref 32.0–48.0)
pH, Arterial: 7.422 (ref 7.350–7.450)
pO2, Arterial: 264 mmHg — ABNORMAL HIGH (ref 83.0–108.0)

## 2021-06-14 LAB — BASIC METABOLIC PANEL
Anion gap: 7 (ref 5–15)
BUN: 19 mg/dL (ref 6–20)
CO2: 29 mmol/L (ref 22–32)
Calcium: 8 mg/dL — ABNORMAL LOW (ref 8.9–10.3)
Chloride: 104 mmol/L (ref 98–111)
Creatinine, Ser: 0.68 mg/dL (ref 0.61–1.24)
GFR, Estimated: >60 mL/min (ref >60)
Glucose, Bld: 142 mg/dL — ABNORMAL HIGH (ref 70–99)
Potassium: 4 mmol/L (ref 3.5–5.1)
Sodium: 140 mmol/L (ref 135–145)

## 2021-06-14 LAB — CBC
HCT: 26.8 % — ABNORMAL LOW (ref 39.0–52.0)
Hemoglobin: 8.5 g/dL — ABNORMAL LOW (ref 13.0–17.0)
MCH: 29.6 pg (ref 26.0–34.0)
MCHC: 31.7 g/dL (ref 30.0–36.0)
MCV: 93.4 fL (ref 80.0–100.0)
Platelets: 228 10*3/uL (ref 150–400)
RBC: 2.87 MIL/uL — ABNORMAL LOW (ref 4.22–5.81)
RDW: 13.6 % (ref 11.5–15.5)
WBC: 12.5 10*3/uL — ABNORMAL HIGH (ref 4.0–10.5)
nRBC: 0 % (ref 0.0–0.2)

## 2021-06-14 LAB — GLUCOSE, CAPILLARY
Glucose-Capillary: 136 mg/dL — ABNORMAL HIGH (ref 70–99)
Glucose-Capillary: 118 mg/dL — ABNORMAL HIGH (ref 70–99)
Glucose-Capillary: 129 mg/dL — ABNORMAL HIGH (ref 70–99)
Glucose-Capillary: 149 mg/dL — ABNORMAL HIGH (ref 70–99)
Glucose-Capillary: 119 mg/dL — ABNORMAL HIGH (ref 70–99)

## 2021-06-14 MED ORDER — CEFEPIME HCL 2 G IJ SOLR
2.0000 g | Freq: Three times a day (TID) | INTRAMUSCULAR | Status: DC
  Administered 2021-06-14 – 2021-06-15 (×6): 2 g via INTRAVENOUS
  Filled 2021-06-14 (×6): qty 2

## 2021-06-14 MED ORDER — SENNA 8.6 MG PO TABS
1.0000 | ORAL_TABLET | Freq: Every day | ORAL | Status: DC
  Administered 2021-06-14: 8.6 mg
  Filled 2021-06-14: qty 1

## 2021-06-14 MED ORDER — BISACODYL 10 MG RE SUPP
10.0000 mg | Freq: Every day | RECTAL | Status: DC
  Administered 2021-06-14: 10 mg via RECTAL
  Filled 2021-06-14: qty 1

## 2021-06-14 MED ORDER — POLYETHYLENE GLYCOL 3350 17 G PO PACK
17.0000 g | PACK | Freq: Three times a day (TID) | ORAL | Status: DC
  Administered 2021-06-14 (×2): 17 g
  Filled 2021-06-14 (×2): qty 1

## 2021-06-14 MED ORDER — MAGNESIUM HYDROXIDE 400 MG/5ML PO SUSP
30.0000 mL | Freq: Every day | ORAL | Status: DC
  Administered 2021-06-14: 30 mL
  Filled 2021-06-14: qty 30

## 2021-06-14 MED ORDER — POLYETHYLENE GLYCOL 3350 17 G PO PACK
17.0000 g | PACK | Freq: Every day | ORAL | Status: DC
  Filled 2021-06-14 (×2): qty 1

## 2021-06-14 MED ORDER — FUROSEMIDE 10 MG/ML IJ SOLN
20.0000 mg | Freq: Once | INTRAMUSCULAR | Status: AC
  Administered 2021-06-14: 20 mg via INTRAVENOUS
  Filled 2021-06-14: qty 2

## 2021-06-14 MED ORDER — FUROSEMIDE 10 MG/ML IJ SOLN
40.0000 mg | Freq: Once | INTRAMUSCULAR | Status: AC
  Administered 2021-06-14: 40 mg via INTRAVENOUS
  Filled 2021-06-14: qty 4
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
## 2021-06-14 NOTE — Progress Notes (Signed)
   06/14/21 0908  Airway  Placement Date/Time: 06/08/21 0234   ETT Types: Oral  Insertion attempts: 1  Secured at (cm): 22 cm  Secured at (cm) 28 cm  Measured From Lips  Secured Location Left  Secured By Actuary Repositioned Yes  Prone position No  Cuff Pressure (cm H2O) Clear OR 27-39 CmH2O  Site Condition Dry  Adult Ventilator Settings  Vent Type Servo i  Humidity HME  Vent Mode PRVC  Vt Set 430 mL  Set Rate 26 bmp  FiO2 (%) 65 %  I Time 0.8 Sec(s)  PEEP 12 cmH20  Adult Ventilator Measurements  Peak Airway Pressure 16 L/min  Mean Airway Pressure 13 cmH20  Plateau Pressure 20 cmH20  Resp Rate Spontaneous 2 br/min  Resp Rate Total 28 br/min  Exhaled Vt 520 mL  Measured Ve 10.4 mL  I:E Ratio Measured 1:1.9  Auto PEEP 0 cmH20  Total PEEP 12 cmH20  SpO2 98 %  Adult Ventilator Alarms  Alarms On Y  Ve High Alarm 20 L/min  Ve Low Alarm 4 L/min  Resp Rate High Alarm 38 br/min  Resp Rate Low Alarm 8  PEEP Low Alarm 3 cmH2O  Press High Alarm 45 cmH2O  Daily Weaning Assessment  Daily Assessment of Readiness to Wean Wean protocol criteria not met  Reason not met Fi02 > 40%;PEEP > 8  Breath Sounds  Bilateral Breath Sounds Rhonchi  Airway Suctioning/Secretions  Suction Type ETT  Suction Device  Catheter  Secretion Amount Small  Secretion Color Tan  Secretion Consistency Thick  Suction Tolerance Tolerated well  Suctioning Adverse Effects None

## 2021-06-14 NOTE — Progress Notes (Signed)
Patient with XXL loose BM today. Rectal pouch applied with no effect, flexi then placed. Patient became very agitated during care despite boluses and PRN Versed. ETT became dislodged during care, RT at bedside and adjusted. MD aware and CXR ordered to confirm placement.
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
## 2021-06-14 NOTE — Progress Notes (Signed)
I was notified that patient coughed and ETT became partially dislodged. It was immediately advanced by RT, with O2 sats maintained in 90s and end tidal CO2 detected. STAT CXR reviewed and confirms appropriate position of ETT within trachea.

## 2021-06-14 NOTE — Progress Notes (Signed)
Advanced ET tube 3 from 25 to 28 per MD order
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
## 2021-06-15 ENCOUNTER — Inpatient Hospital Stay (HOSPITAL_COMMUNITY): Payer: BC Managed Care – PPO

## 2021-06-15 LAB — BASIC METABOLIC PANEL WITH GFR
Anion gap: 7 (ref 5–15)
BUN: 22 mg/dL — ABNORMAL HIGH (ref 6–20)
CO2: 30 mmol/L (ref 22–32)
Calcium: 8 mg/dL — ABNORMAL LOW (ref 8.9–10.3)
Chloride: 103 mmol/L (ref 98–111)
Creatinine, Ser: 0.81 mg/dL (ref 0.61–1.24)
GFR, Estimated: >60 mL/min
Glucose, Bld: 121 mg/dL — ABNORMAL HIGH (ref 70–99)
Potassium: 3.6 mmol/L (ref 3.5–5.1)
Sodium: 140 mmol/L (ref 135–145)

## 2021-06-15 LAB — URINALYSIS, ROUTINE W REFLEX MICROSCOPIC
Bilirubin Urine: NEGATIVE
Glucose, UA: NEGATIVE mg/dL
Hgb urine dipstick: NEGATIVE
Ketones, ur: NEGATIVE mg/dL
Leukocytes,Ua: NEGATIVE
Nitrite: NEGATIVE
Protein, ur: NEGATIVE mg/dL
Specific Gravity, Urine: 1.019 (ref 1.005–1.030)
pH: 5 (ref 5.0–8.0)

## 2021-06-15 LAB — CULTURE, RESPIRATORY W GRAM STAIN: Culture: NORMAL

## 2021-06-15 LAB — CBC
HCT: 27.6 % — ABNORMAL LOW (ref 39.0–52.0)
Hemoglobin: 8.7 g/dL — ABNORMAL LOW (ref 13.0–17.0)
MCH: 29.2 pg (ref 26.0–34.0)
MCHC: 31.5 g/dL (ref 30.0–36.0)
MCV: 92.6 fL (ref 80.0–100.0)
Platelets: 242 K/uL (ref 150–400)
RBC: 2.98 MIL/uL — ABNORMAL LOW (ref 4.22–5.81)
RDW: 13.6 % (ref 11.5–15.5)
WBC: 10.9 K/uL — ABNORMAL HIGH (ref 4.0–10.5)
nRBC: 0.3 % — ABNORMAL HIGH (ref 0.0–0.2)

## 2021-06-15 LAB — GLUCOSE, CAPILLARY
Glucose-Capillary: 112 mg/dL — ABNORMAL HIGH (ref 70–99)
Glucose-Capillary: 112 mg/dL — ABNORMAL HIGH (ref 70–99)
Glucose-Capillary: 114 mg/dL — ABNORMAL HIGH (ref 70–99)
Glucose-Capillary: 119 mg/dL — ABNORMAL HIGH (ref 70–99)
Glucose-Capillary: 127 mg/dL — ABNORMAL HIGH (ref 70–99)
Glucose-Capillary: 133 mg/dL — ABNORMAL HIGH (ref 70–99)
Glucose-Capillary: 97 mg/dL (ref 70–99)

## 2021-06-15 LAB — POCT I-STAT 7, (LYTES, BLD GAS, ICA,H+H)
Acid-Base Excess: 6 mmol/L — ABNORMAL HIGH (ref 0.0–2.0)
Bicarbonate: 30.5 mmol/L — ABNORMAL HIGH (ref 20.0–28.0)
Calcium, Ion: 1.1 mmol/L — ABNORMAL LOW (ref 1.15–1.40)
HCT: 42 % (ref 39.0–52.0)
Hemoglobin: 14.3 g/dL (ref 13.0–17.0)
O2 Saturation: 96 %
Potassium: 3.4 mmol/L — ABNORMAL LOW (ref 3.5–5.1)
Sodium: 141 mmol/L (ref 135–145)
TCO2: 32 mmol/L (ref 22–32)
pCO2 arterial: 43.1 mmHg (ref 32.0–48.0)
pH, Arterial: 7.458 — ABNORMAL HIGH (ref 7.350–7.450)
pO2, Arterial: 79 mmHg — ABNORMAL LOW (ref 83.0–108.0)

## 2021-06-15 LAB — TRIGLYCERIDES: Triglycerides: 104 mg/dL (ref <150)

## 2021-06-15 MED ORDER — CLONAZEPAM 0.25 MG PO TBDP: 0.2500 mg | ORAL_TABLET | Freq: Two times a day (BID) | ORAL | Status: DC

## 2021-06-15 MED ORDER — QUETIAPINE FUMARATE 100 MG PO TABS
150.0000 mg | ORAL_TABLET | Freq: Two times a day (BID) | ORAL | Status: DC
  Administered 2021-06-15: 150 mg
  Filled 2021-06-15: qty 2

## 2021-06-15 MED ORDER — POTASSIUM CHLORIDE 20 MEQ PO PACK
40.0000 meq | PACK | ORAL | Status: AC
  Administered 2021-06-15 (×2): 40 meq
  Filled 2021-06-15: qty 2

## 2021-06-15 MED ORDER — CLONAZEPAM 0.25 MG PO TBDP
0.2500 mg | ORAL_TABLET | Freq: Two times a day (BID) | ORAL | Status: DC
Start: 2021-06-15 — End: 2021-06-15
  Administered 2021-06-15: 0.25 mg

## 2021-06-15 MED ORDER — QUETIAPINE FUMARATE 100 MG PO TABS
100.0000 mg | ORAL_TABLET | Freq: Two times a day (BID) | ORAL | Status: DC
  Administered 2021-06-15: 100 mg
  Filled 2021-06-15: qty 1

## 2021-06-15 MED ORDER — POTASSIUM CHLORIDE 20 MEQ PO PACK
40.0000 meq | PACK | ORAL | Status: DC
  Filled 2021-06-15: qty 2

## 2021-06-15 MED ORDER — CLONAZEPAM 0.1 MG/ML ORAL SUSPENSION: 0.2500 mg | Freq: Two times a day (BID) | ORAL | Status: DC

## 2021-06-15 MED ORDER — FUROSEMIDE 10 MG/ML IJ SOLN
60.0000 mg | Freq: Once | INTRAMUSCULAR | Status: AC
  Administered 2021-06-15: 60 mg via INTRAVENOUS
  Filled 2021-06-15: qty 6

## 2021-06-15 MED ORDER — PROPOFOL 1000 MG/100ML IV EMUL
0.0000 ug/kg/min | INTRAVENOUS | Status: DC
  Administered 2021-06-15: 50 ug/kg/min via INTRAVENOUS
  Administered 2021-06-15: 70 ug/kg/min via INTRAVENOUS
  Administered 2021-06-15: 65 ug/kg/min via INTRAVENOUS
  Administered 2021-06-16: 75 ug/kg/min via INTRAVENOUS
  Administered 2021-06-16 (×2): 70 ug/kg/min via INTRAVENOUS
  Administered 2021-06-16: 75 ug/kg/min via INTRAVENOUS
  Administered 2021-06-16: 30 ug/kg/min via INTRAVENOUS
  Administered 2021-06-16: 75 ug/kg/min via INTRAVENOUS
  Administered 2021-06-16: 40 ug/kg/min via INTRAVENOUS
  Administered 2021-06-17: 50 ug/kg/min via INTRAVENOUS
  Administered 2021-06-17: 40 ug/kg/min via INTRAVENOUS
  Administered 2021-06-18: 70 ug/kg/min via INTRAVENOUS
  Administered 2021-06-18: 40 ug/kg/min via INTRAVENOUS
  Administered 2021-06-18: 50 ug/kg/min via INTRAVENOUS
  Administered 2021-06-18: 40 ug/kg/min via INTRAVENOUS
  Administered 2021-06-18: 80 ug/kg/min via INTRAVENOUS
  Administered 2021-06-18: 60 ug/kg/min via INTRAVENOUS
  Administered 2021-06-19: 20 ug/kg/min via INTRAVENOUS
  Administered 2021-06-19: 50 ug/kg/min via INTRAVENOUS
  Administered 2021-06-19: 30 ug/kg/min via INTRAVENOUS
  Administered 2021-06-19 – 2021-06-20 (×2): 50 ug/kg/min via INTRAVENOUS
  Administered 2021-06-20: 40 ug/kg/min via INTRAVENOUS
  Administered 2021-06-20 (×2): 25 ug/kg/min via INTRAVENOUS
  Administered 2021-06-20: 40 ug/kg/min via INTRAVENOUS
  Administered 2021-06-21: 25 ug/kg/min via INTRAVENOUS
  Administered 2021-06-21: 20 ug/kg/min via INTRAVENOUS
  Administered 2021-06-21: 25 ug/kg/min via INTRAVENOUS
  Administered 2021-06-22: 30 ug/kg/min via INTRAVENOUS
  Administered 2021-06-22: 25 ug/kg/min via INTRAVENOUS
  Administered 2021-06-22: 30 ug/kg/min via INTRAVENOUS
  Administered 2021-06-23 (×2): 40 ug/kg/min via INTRAVENOUS
  Filled 2021-06-15: qty 100
  Filled 2021-06-15 (×2): qty 200
  Filled 2021-06-15 (×26): qty 100
  Filled 2021-06-15: qty 200
  Filled 2021-06-15 (×4): qty 100

## 2021-06-15 MED ORDER — CLONAZEPAM 0.25 MG PO TBDP
0.5000 mg | ORAL_TABLET | Freq: Two times a day (BID) | ORAL | Status: DC
Start: 2021-06-15 — End: 2021-06-16
  Administered 2021-06-15: 0.5 mg
  Filled 2021-06-15: qty 2

## 2021-06-15 MED ORDER — CLONAZEPAM 0.25 MG PO TBDP
0.2500 mg | ORAL_TABLET | Freq: Two times a day (BID) | ORAL | Status: DC
  Filled 2021-06-15: qty 1

## 2021-06-15 MED ORDER — GABAPENTIN 250 MG/5ML PO SOLN
300.0000 mg | Freq: Three times a day (TID) | ORAL | Status: DC
  Administered 2021-06-15 – 2021-06-27 (×36): 300 mg
  Filled 2021-06-15 (×41): qty 6

## 2021-06-15 NOTE — Progress Notes (Signed)
Increased PEEP back to 10 because of SPO2 89%. RT will continue to monitor.
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
## 2021-06-15 NOTE — Progress Notes (Addendum)
This nurse at bedside when patient went bradycardic from HR 60's to HR 23 at the lowest.. Never lost pulse. Patient's HR spontaneously returned to NSR 60's after approximately one minute. Precedex turned off. Pads applied. Dr. Freida Busman aware. Medications adjusted, family at bedside and educated. EKG obtained.  Vitals stable at this time.   BP 145/61 Aline HR 65 NSR RR 26 Spo2 97% - Fio2 50% PEEP 10
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
## 2021-06-16 ENCOUNTER — Inpatient Hospital Stay: Payer: Self-pay

## 2021-06-16 LAB — POCT I-STAT 7, (LYTES, BLD GAS, ICA,H+H)
Acid-Base Excess: 6 mmol/L — ABNORMAL HIGH (ref 0.0–2.0)
Bicarbonate: 30.6 mmol/L — ABNORMAL HIGH (ref 20.0–28.0)
Calcium, Ion: 1.14 mmol/L — ABNORMAL LOW (ref 1.15–1.40)
HCT: 25 % — ABNORMAL LOW (ref 39.0–52.0)
Hemoglobin: 8.5 g/dL — ABNORMAL LOW (ref 13.0–17.0)
O2 Saturation: 97 %
Patient temperature: 100.8
Potassium: 3.7 mmol/L (ref 3.5–5.1)
Sodium: 141 mmol/L (ref 135–145)
TCO2: 32 mmol/L (ref 22–32)
pCO2 arterial: 44.6 mmHg (ref 32.0–48.0)
pH, Arterial: 7.449 (ref 7.350–7.450)
pO2, Arterial: 98 mmHg (ref 83.0–108.0)

## 2021-06-16 LAB — BASIC METABOLIC PANEL
Anion gap: 8 (ref 5–15)
BUN: 41 mg/dL — ABNORMAL HIGH (ref 6–20)
CO2: 30 mmol/L (ref 22–32)
Calcium: 8.4 mg/dL — ABNORMAL LOW (ref 8.9–10.3)
Chloride: 101 mmol/L (ref 98–111)
Creatinine, Ser: 1.44 mg/dL — ABNORMAL HIGH (ref 0.61–1.24)
GFR, Estimated: >60 mL/min (ref >60)
Glucose, Bld: 112 mg/dL — ABNORMAL HIGH (ref 70–99)
Potassium: 3.8 mmol/L (ref 3.5–5.1)
Sodium: 139 mmol/L (ref 135–145)

## 2021-06-16 LAB — CBC
HCT: 28.2 % — ABNORMAL LOW (ref 39.0–52.0)
Hemoglobin: 9 g/dL — ABNORMAL LOW (ref 13.0–17.0)
MCH: 29.7 pg (ref 26.0–34.0)
MCHC: 31.9 g/dL (ref 30.0–36.0)
MCV: 93.1 fL (ref 80.0–100.0)
Platelets: 289 10*3/uL (ref 150–400)
RBC: 3.03 MIL/uL — ABNORMAL LOW (ref 4.22–5.81)
RDW: 13.7 % (ref 11.5–15.5)
WBC: 13.7 10*3/uL — ABNORMAL HIGH (ref 4.0–10.5)
nRBC: 0 % (ref 0.0–0.2)

## 2021-06-16 LAB — PHOSPHORUS: Phosphorus: 4.6 mg/dL (ref 2.5–4.6)

## 2021-06-16 LAB — GLUCOSE, CAPILLARY
Glucose-Capillary: 115 mg/dL — ABNORMAL HIGH (ref 70–99)
Glucose-Capillary: 129 mg/dL — ABNORMAL HIGH (ref 70–99)
Glucose-Capillary: 133 mg/dL — ABNORMAL HIGH (ref 70–99)
Glucose-Capillary: 116 mg/dL — ABNORMAL HIGH (ref 70–99)
Glucose-Capillary: 136 mg/dL — ABNORMAL HIGH (ref 70–99)

## 2021-06-16 LAB — MAGNESIUM: Magnesium: 2.4 mg/dL (ref 1.7–2.4)

## 2021-06-16 MED ORDER — IPRATROPIUM-ALBUTEROL 0.5-2.5 (3) MG/3ML IN SOLN: 3.0000 mL | Freq: Four times a day (QID) | RESPIRATORY_TRACT | Status: DC | PRN

## 2021-06-16 MED ORDER — POTASSIUM CHLORIDE 20 MEQ PO PACK
40.0000 meq | PACK | Freq: Once | ORAL | Status: AC
  Administered 2021-06-16: 40 meq
  Filled 2021-06-16: qty 2

## 2021-06-16 MED ORDER — PANTOPRAZOLE 2 MG/ML SUSPENSION
40.0000 mg | Freq: Every day | ORAL | Status: DC
  Administered 2021-06-17 – 2021-06-27 (×10): 40 mg
  Filled 2021-06-16 (×12): qty 20

## 2021-06-16 MED ORDER — SODIUM CHLORIDE 0.9% FLUSH
10.0000 mL | Freq: Two times a day (BID) | INTRAVENOUS | Status: DC
  Administered 2021-06-17 – 2021-06-19 (×5): 10 mL
  Administered 2021-06-19 – 2021-06-20 (×2): 20 mL
  Administered 2021-06-21 – 2021-06-22 (×3): 10 mL
  Administered 2021-06-23: 20 mL
  Administered 2021-06-24: 40 mL
  Administered 2021-06-24 – 2021-06-25 (×2): 30 mL
  Administered 2021-06-25 – 2021-06-27 (×4): 10 mL

## 2021-06-16 MED ORDER — QUETIAPINE FUMARATE 200 MG PO TABS
200.0000 mg | ORAL_TABLET | Freq: Two times a day (BID) | ORAL | Status: DC
  Administered 2021-06-16 – 2021-06-19 (×8): 200 mg
  Filled 2021-06-16 (×8): qty 1

## 2021-06-16 MED ORDER — FUROSEMIDE 10 MG/ML IJ SOLN
40.0000 mg | Freq: Once | INTRAMUSCULAR | Status: AC
  Administered 2021-06-16: 40 mg via INTRAVENOUS
  Filled 2021-06-16: qty 4

## 2021-06-16 MED ORDER — SODIUM CHLORIDE 0.9% FLUSH: 10.0000 mL | INTRAVENOUS | Status: DC | PRN

## 2021-06-16 MED ORDER — CLONAZEPAM 0.5 MG PO TABS
1.0000 mg | ORAL_TABLET | Freq: Two times a day (BID) | ORAL | Status: DC
Start: 2021-06-16 — End: 2021-06-19
  Administered 2021-06-16 – 2021-06-18 (×6): 1 mg
  Filled 2021-06-16 (×6): qty 2

## 2021-06-16 NOTE — Progress Notes (Signed)
Peripherally Inserted Central Catheter Placement  The IV Nurse has discussed with the patient and/or persons authorized to consent for the patient, the purpose of this procedure and the potential benefits and risks involved with this procedure.  The benefits include less needle sticks, lab draws from the catheter, and the patient may be discharged home with the catheter. Risks include, but not limited to, infection, bleeding, blood clot (thrombus formation), and puncture of an artery; nerve damage and irregular heartbeat and possibility to perform a PICC exchange if needed/ordered by physician.  Alternatives to this procedure were also discussed.  Bard Power PICC patient education guide, fact sheet on infection prevention and patient information card has been provided to patient /or left at bedside.    PICC Placement Documentation  PICC Triple Lumen 06/16/21 PICC Right Basilic 39 cm 1 cm (Active)  Indication for Insertion or Continuance of Line Prolonged intravenous therapies 06/16/21 1844  Exposed Catheter (cm) 1 cm 06/16/21 1844  Site Assessment Clean;Dry;Intact 06/16/21 1844  Lumen #1 Status Flushed;Blood return noted;Saline locked 06/16/21 1844  Lumen #2 Status Flushed;Blood return noted;Saline locked 06/16/21 1844  Lumen #3 Status Flushed;Blood return noted;Saline locked 06/16/21 1844  Dressing Type Transparent 06/16/21 1844  Dressing Status Clean;Dry;Intact 06/16/21 1844  Antimicrobial disc in place? Yes 06/16/21 1844  Dressing Change Due 06/23/21 06/16/21 1844       Audrie Gallus 06/16/2021, 6:49 PM

## 2021-06-16 NOTE — Progress Notes (Signed)
Referring Physician(s): Coletta Memos, MD  Supervising Physician: Julieanne Cotton  Patient Status:  [facility] - In-pt  Chief Complaint: Follow up right ICA dissection s/p stenting 06/12/21  Subjective:  Patient remains intubated and sedated on exam this afternoon, per mom at bedside he is able to follow commands ("squeeze mom's hand on the left" "open eyes") and turns his head towards person talking. She noticed that he is slightly weaker on the left hand. She also notes he was "stacked breathing" on the vent and they are planning to try to wean him off the vent soon.  Allergies: Patient has no known allergies.  Medications: Prior to Admission medications   Not on File     Vital Signs: BP 122/62   Pulse 100   Temp 99.8 F (37.7 C) (Oral)   Resp (!) 26   Ht 5\' 10"  (1.778 m)   Wt 208 lb 15.9 oz (94.8 kg)   SpO2 95%   BMI 29.99 kg/m   Physical Exam Vitals and nursing note reviewed.  Constitutional:      Comments: Sedated, intubated  HENT:     Head: Normocephalic.  Cardiovascular:     Rate and Rhythm: Normal rate.  Pulmonary:     Comments: Ventilated, intubated Skin:    General: Skin is warm and dry.    Imaging: DG Chest Port 1 View  Result Date: 06/15/2021 CLINICAL DATA:  Respiratory failure EXAM: PORTABLE CHEST 1 VIEW COMPARISON:  Chest x-ray dated 06/14/2021 FINDINGS: 0415 hours. Endotracheal tube is well positioned with tip just above the level of the carina. Enteric tube passes below the diaphragm. RIGHT IJ central line appears well positioned with tip at the level the RIGHT atrium. Heart size and mediastinal contours are stable. Subtle hazy opacities at the LEFT lung base. Lungs otherwise clear. No pleural effusion or pneumothorax is seen. IMPRESSION: 1. Endotracheal tube well positioned with tip just above the level of the carina. 2. Subtle hazy opacities at the LEFT lung base, likely atelectasis and/or small pleural effusion. 3. Lungs otherwise clear.  Electronically Signed   By: 14/09/2020 M.D.   On: 06/15/2021 08:04   DG CHEST PORT 1 VIEW  Result Date: 06/14/2021 CLINICAL DATA:  Encounter for intubation. EXAM: PORTABLE CHEST 1 VIEW COMPARISON:  06/14/2021 FINDINGS: Endotracheal tube is 5.0 cm above the carina. Right jugular central line with the tip in the lower SVC region and stable. Slightly improved aeration at the left lung base. Feeding tube extends into the abdomen but the tip is beyond the image. Patchy densities in right perihilar region could represent atelectasis. Negative for a pneumothorax. IMPRESSION: 1.  Endotracheal tube is appropriately positioned. 2. Improved aeration at the left lung base with residual hazy densities. Findings could represent a combination of atelectasis and pleural fluid. 3. Right perihilar densities may represent atelectasis. Electronically Signed   By: 14/09/2020 M.D.   On: 06/14/2021 16:18   DG Chest Port 1 View  Result Date: 06/14/2021 CLINICAL DATA:  Endotracheal tube advancement EXAM: PORTABLE CHEST 1 VIEW COMPARISON:  06/13/2021 FINDINGS: Endotracheal tube is now approximately 3 cm above the carina. Right IJ central line is unchanged. Enteric tube is within the stomach with tip out of field of view. Persistent small left pleural effusion and retrocardiac atelectasis/consolidation. Stable cardiomediastinal contours. IMPRESSION: Endotracheal tube is now approximately 3 cm above the carina. Persistent small left pleural effusion and retrocardiac atelectasis/consolidation. Electronically Signed   By: 14/08/2020 M.D.   On: 06/14/2021 11:58  DG CHEST PORT 1 VIEW  Result Date: 06/13/2021 CLINICAL DATA:  Intubated EXAM: PORTABLE CHEST 1 VIEW COMPARISON:  06/13/2021, CT 06/08/2021 FINDINGS: Endotracheal tube tip is about 5.8 cm superior to carina. Esophageal tube tip below the diaphragm but incompletely visualized. Right IJ central venous catheter tip at the cavoatrial junction. Slight decreased left pleural  effusion with improved aeration at left base. Residual airspace disease. Normal cardiac size. IMPRESSION: 1. Endotracheal tube tip about 5.8 cm superior to carina. 2. Small left-sided pleural effusion with slightly improved aeration at left lung base. There is residual airspace disease Electronically Signed   By: Jasmine Pang M.D.   On: 06/13/2021 18:06   DG CHEST PORT 1 VIEW  Result Date: 06/13/2021 CLINICAL DATA:  Oxygen desaturation, intubated EXAM: PORTABLE CHEST 1 VIEW COMPARISON:  Chest radiograph from earlier today. FINDINGS: Endotracheal tube tip is 5.1 cm above the carina. Enteric tube enters stomach with the tip not seen on this image. Right internal jugular central venous catheter terminates near the cavoatrial junction. Stable cardiomediastinal silhouette with normal heart size. No pneumothorax. Stable small left pleural effusion. No right pleural effusion. Low lung volumes with increased dense left lung base consolidation. Stable mild right basilar atelectasis. IMPRESSION: 1. Well-positioned support structures. No pneumothorax. 2. Low lung volumes with increased dense left lung base consolidation, probably atelectasis and/or aspiration. 3. Stable small left pleural effusion. 4. Stable mild right basilar atelectasis. Electronically Signed   By: Delbert Phenix M.D.   On: 06/13/2021 14:39   DG CHEST PORT 1 VIEW  Result Date: 06/13/2021 CLINICAL DATA:  20 year old male with history of respiratory failure. EXAM: PORTABLE CHEST 1 VIEW COMPARISON:  Chest x-ray 06/10/2021. FINDINGS: An endotracheal tube is in place with tip 3.6 cm above the carina. There is a right-sided internal jugular central venous catheter with tip terminating in the right atrium. A feeding tube is seen extending into the abdomen, however, the tip of the feeding tube extends below the lower margin of the image. Lung volumes are low. Diffuse hazy opacity overlying the left hemithorax which appears to reflect a layering left-sided  pleural effusion. Opacity at the left lung base may reflect atelectasis and/or consolidation. Right lung appears clear. No right pleural effusion. No pneumothorax. No evidence of pulmonary edema. Mild cardiomegaly. The patient is rotated to the right on today's exam, resulting in distortion of the mediastinal contours and reduced diagnostic sensitivity and specificity for mediastinal pathology. IMPRESSION: 1. Support apparatus, as above. 2. Moderate layering left pleural effusion with atelectasis and/or consolidation in the left lung base. Electronically Signed   By: Trudie Reed M.D.   On: 06/13/2021 08:09   IR US CHEST  Result Date: 06/13/2021 CLINICAL DATA:  Evaluate for thoracentesis. EXAM: CHEST ULTRASOUND COMPARISON:  Abdominal CT 06/11/2021 FINDINGS: No significant pleural fluid identified. Thoracentesis not performed due to the small amount of pleural fluid. IMPRESSION: No significant pleural fluid identified. Thoracentesis not performed. Electronically Signed   By: Richarda Overlie M.D.   On: 06/13/2021 18:23   [place] EKG SITE RITE  Result Date: 06/16/2021 If Site Rite image not attached, placement could not be confirmed due to current cardiac rhythm.   Labs:  CBC: Recent Labs    06/13/21 0600 06/13/21 0817 06/14/21 0410 06/15/21 0422 06/15/21 0436 06/16/21 0357 06/16/21 0540  WBC 11.8*  --  12.5* 10.9*  --   --  13.7*  HGB 8.8*   < > 8.5* 8.7* 14.3 8.5* 9.0*  HCT 28.0*   < > 26.8*  27.6* 42.0 25.0* 28.2*  PLT 219  --  228 242  --   --  289   < > = values in this interval not displayed.    COAGS: Recent Labs    06/08/21 0245  INR 1.0    BMP: Recent Labs    06/13/21 0600 06/13/21 0817 06/14/21 0410 06/15/21 0422 06/15/21 0436 06/16/21 0357 06/16/21 0540  NA 138   < > 140 140 141 141 139  K 4.0   < > 4.0 3.6 3.4* 3.7 3.8  CL 104  --  104 103  --   --  101  CO2 30  --  29 30  --   --  30  GLUCOSE 128*  --  142* 121*  --   --  112*  BUN 11  --  19 22*  --   --  41*   CALCIUM 8.0*  --  8.0* 8.0*  --   --  8.4*  CREATININE 0.63  --  0.68 0.81  --   --  1.44*  GFRNONAA >60  --  >60 >60  --   --  >60   < > = values in this interval not displayed.    LIVER FUNCTION TESTS: Recent Labs    06/08/21 0245  BILITOT 0.6  AST 175*  ALT 122*  ALKPHOS 72  PROT 7.3  ALBUMIN 3.9    Assessment and Plan:  20 y/o M s/p right ICA dissection stent placement 06/12/21 with Dr. Corliss Skains seen today for follow up. Per mom at bedside when sedation is decreased patient follows commands, tracks who is talking and is able to move all 4 extremities though left handed noted to be slightly weaker than right. Patient sedated on exam so unable to complete formal neuro exam today.  Continue Brilita + ASA, p2y12 drawn today, CTA head/neck planned for tomorrow.  NIR will continue to follow along, plans per primary team. Please call with questions or concerns.  Electronically Signed: Villa Herb, PA-C 06/16/2021, 3:42 PM   I spent a total of 15 Minutes at the the patient's bedside AND on the patient's hospital floor or unit, greater than 50% of which was counseling/coordinating care for right ICA dissection.

## 2021-06-16 NOTE — Progress Notes (Signed)
Patient sedated and intubated.  Events of the weekend noted.  I spoke with Dr. Janee Morn of trauma and he feels like the patient has ARDS.  Neurologically he appears to be stable based on the exam as mentioned by the mother, nurses, and Dr. Janee Morn.  We will continue to follow.
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
## 2021-06-17 ENCOUNTER — Other Ambulatory Visit: Payer: Self-pay | Admitting: Student

## 2021-06-17 ENCOUNTER — Inpatient Hospital Stay (HOSPITAL_COMMUNITY): Payer: BC Managed Care – PPO

## 2021-06-17 ENCOUNTER — Other Ambulatory Visit (HOSPITAL_COMMUNITY): Payer: Self-pay

## 2021-06-17 LAB — BASIC METABOLIC PANEL
Anion gap: 11 (ref 5–15)
Anion gap: 12 (ref 5–15)
BUN: 65 mg/dL — ABNORMAL HIGH (ref 6–20)
BUN: 61 mg/dL — ABNORMAL HIGH (ref 6–20)
CO2: 28 mmol/L (ref 22–32)
CO2: 27 mmol/L (ref 22–32)
Calcium: 8.7 mg/dL — ABNORMAL LOW (ref 8.9–10.3)
Calcium: 8.4 mg/dL — ABNORMAL LOW (ref 8.9–10.3)
Chloride: 101 mmol/L (ref 98–111)
Chloride: 104 mmol/L (ref 98–111)
Creatinine, Ser: 2.07 mg/dL — ABNORMAL HIGH (ref 0.61–1.24)
Creatinine, Ser: 1.88 mg/dL — ABNORMAL HIGH (ref 0.61–1.24)
GFR, Estimated: 46 mL/min — ABNORMAL LOW (ref >60)
GFR, Estimated: 51 mL/min — ABNORMAL LOW (ref >60)
Glucose, Bld: 117 mg/dL — ABNORMAL HIGH (ref 70–99)
Glucose, Bld: 144 mg/dL — ABNORMAL HIGH (ref 70–99)
Potassium: 5 mmol/L (ref 3.5–5.1)
Potassium: 4.8 mmol/L (ref 3.5–5.1)
Sodium: 140 mmol/L (ref 135–145)
Sodium: 143 mmol/L (ref 135–145)

## 2021-06-17 LAB — PHOSPHORUS: Phosphorus: 6.7 mg/dL — ABNORMAL HIGH (ref 2.5–4.6)

## 2021-06-17 LAB — POCT I-STAT 7, (LYTES, BLD GAS, ICA,H+H)
Acid-Base Excess: 4 mmol/L — ABNORMAL HIGH (ref 0.0–2.0)
Bicarbonate: 29.9 mmol/L — ABNORMAL HIGH (ref 20.0–28.0)
Calcium, Ion: 1.16 mmol/L (ref 1.15–1.40)
HCT: 37 % — ABNORMAL LOW (ref 39.0–52.0)
Hemoglobin: 12.6 g/dL — ABNORMAL LOW (ref 13.0–17.0)
O2 Saturation: 97 %
Patient temperature: 98
Potassium: 4.8 mmol/L (ref 3.5–5.1)
Sodium: 142 mmol/L (ref 135–145)
TCO2: 31 mmol/L (ref 22–32)
pCO2 arterial: 49.3 mmHg — ABNORMAL HIGH (ref 32.0–48.0)
pH, Arterial: 7.39 (ref 7.350–7.450)
pO2, Arterial: 95 mmHg (ref 83.0–108.0)

## 2021-06-17 LAB — CBC
HCT: 28.7 % — ABNORMAL LOW (ref 39.0–52.0)
Hemoglobin: 9 g/dL — ABNORMAL LOW (ref 13.0–17.0)
MCH: 29.5 pg (ref 26.0–34.0)
MCHC: 31.4 g/dL (ref 30.0–36.0)
MCV: 94.1 fL (ref 80.0–100.0)
Platelets: 319 10*3/uL (ref 150–400)
RBC: 3.05 MIL/uL — ABNORMAL LOW (ref 4.22–5.81)
RDW: 14 % (ref 11.5–15.5)
WBC: 15.6 10*3/uL — ABNORMAL HIGH (ref 4.0–10.5)
nRBC: 0 % (ref 0.0–0.2)

## 2021-06-17 LAB — GLUCOSE, CAPILLARY
Glucose-Capillary: 119 mg/dL — ABNORMAL HIGH (ref 70–99)
Glucose-Capillary: 122 mg/dL — ABNORMAL HIGH (ref 70–99)
Glucose-Capillary: 123 mg/dL — ABNORMAL HIGH (ref 70–99)
Glucose-Capillary: 129 mg/dL — ABNORMAL HIGH (ref 70–99)
Glucose-Capillary: 138 mg/dL — ABNORMAL HIGH (ref 70–99)
Glucose-Capillary: 138 mg/dL — ABNORMAL HIGH (ref 70–99)
Glucose-Capillary: 138 mg/dL — ABNORMAL HIGH (ref 70–99)

## 2021-06-17 LAB — MAGNESIUM: Magnesium: 2.9 mg/dL — ABNORMAL HIGH (ref 1.7–2.4)

## 2021-06-17 MED ORDER — METOPROLOL TARTRATE 25 MG/10 ML ORAL SUSPENSION
12.5000 mg | Freq: Two times a day (BID) | ORAL | Status: DC
  Administered 2021-06-17 – 2021-06-18 (×3): 12.5 mg
  Filled 2021-06-17 (×3): qty 10

## 2021-06-17 MED ORDER — SODIUM CHLORIDE 0.9 % IV SOLN: INTRAVENOUS | Status: DC

## 2021-06-17 MED ORDER — PIVOT 1.5 CAL PO LIQD
1000.0000 mL | ORAL | Status: DC
Start: 2021-06-17 — End: 2021-06-25
  Administered 2021-06-17 – 2021-06-24 (×8): 1000 mL
  Filled 2021-06-17: qty 1000

## 2021-06-17 MED ORDER — METHOCARBAMOL 750 MG PO TABS
750.0000 mg | ORAL_TABLET | Freq: Three times a day (TID) | ORAL | Status: DC
  Administered 2021-06-17 – 2021-06-27 (×30): 750 mg
  Filled 2021-06-17 (×6): qty 2
  Filled 2021-06-17: qty 1
  Filled 2021-06-17 (×14): qty 2
  Filled 2021-06-17: qty 1
  Filled 2021-06-17: qty 2
  Filled 2021-06-17: qty 1
  Filled 2021-06-17 (×8): qty 2

## 2021-06-17 MED ORDER — ALBUMIN HUMAN 5 % IV SOLN
12.5000 g | Freq: Once | INTRAVENOUS | Status: AC
  Administered 2021-06-17: 12.5 g via INTRAVENOUS
  Filled 2021-06-17: qty 250

## 2021-06-17 MED ORDER — CEFEPIME HCL 2 G IJ SOLR
2.0000 g | Freq: Three times a day (TID) | INTRAMUSCULAR | Status: DC
  Administered 2021-06-17: 2 g via INTRAVENOUS
  Filled 2021-06-17: qty 2

## 2021-06-17 MED ORDER — METOPROLOL TARTRATE 5 MG/5ML IV SOLN
5.0000 mg | Freq: Four times a day (QID) | INTRAVENOUS | Status: DC | PRN
  Administered 2021-06-17 – 2021-06-20 (×4): 5 mg via INTRAVENOUS
  Filled 2021-06-17 (×4): qty 5

## 2021-06-17 MED ORDER — SODIUM CHLORIDE 0.9 % IV SOLN
2.0000 g | Freq: Two times a day (BID) | INTRAVENOUS | Status: DC
  Administered 2021-06-17 – 2021-06-19 (×4): 2 g via INTRAVENOUS
  Filled 2021-06-17 (×4): qty 2

## 2021-06-17 NOTE — Progress Notes (Signed)
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 Parents at bedside today.  Allergies: Patient has no known allergies.  Medications: Prior to Admission medications   Not on File     Vital Signs: BP (!) 157/88   Pulse (!) 108   Temp (!) 101.1 F (38.4 C) (Axillary)   Resp (!) 31   Ht 5\' 10"  (1.778 m)   Wt 208 lb 15.9 oz (94.8 kg)   SpO2 93%   BMI 29.99 kg/m   Physical Exam Vitals and nursing note reviewed.  Constitutional:      Comments: Sedated. Does not arouse to verbal/tactile cues on exam today.  Cardiovascular:     Rate and Rhythm: Tachycardia present.  Pulmonary:     Comments: Ventilated/sedated Skin:    General: Skin is warm and dry.    Imaging: CT ANGIO HEAD NECK W WO CM  Result Date: 06/17/2021 CLINICAL DATA:  Stroke follow-up EXAM: CT ANGIOGRAPHY HEAD AND NECK TECHNIQUE: Multidetector CT imaging of the head and neck was performed using the standard protocol during bolus administration of intravenous contrast. Multiplanar CT image reconstructions and MIPs were obtained to evaluate the vascular anatomy. Carotid stenosis measurements (when applicable) are obtained utilizing NASCET criteria, using the distal internal carotid diameter as the denominator. CONTRAST:  14/12/2020 OMNIPAQUE IOHEXOL 300 MG/ML  SOLN COMPARISON:  06/12/2021 head CT CTA head neck 06/09/2021 FINDINGS: CT HEAD FINDINGS Brain: Unchanged appearance of small right posterior convexity subdural hematoma. There is blood within both occipital horns of the lateral ventricles. There is no acute or chronic infarction. Skull: Unchanged appearance of fractures involving the temporal bones and central skull base. Sinuses/Orbits: Bilateral mastoid  effusions, likely blood. Partial opacification of the paranasal sinuses. The orbits are normal. CTA NECK FINDINGS SKELETON: There is no bony spinal canal stenosis. No lytic or blastic lesion. OTHER NECK: Normal pharynx, larynx and major salivary glands. No cervical lymphadenopathy. Unremarkable thyroid gland. UPPER CHEST: Small pleural effusions AORTIC ARCH: There is no calcific atherosclerosis of the aortic arch. There is no aneurysm, dissection or hemodynamically significant stenosis of the visualized portion of the aorta. Conventional 3 vessel aortic branching pattern. The visualized proximal subclavian arteries are widely patent. RIGHT CAROTID SYSTEM: Normal without aneurysm, dissection or stenosis. LEFT CAROTID SYSTEM: Normal without aneurysm, dissection or stenosis. VERTEBRAL ARTERIES: Left dominant configuration. Both origins are clearly patent. There is no dissection, occlusion or flow-limiting stenosis to the skull base (V1-V3 segments). CTA HEAD FINDINGS POSTERIOR CIRCULATION: --Vertebral arteries: Normal V4 segments. --Inferior cerebellar arteries: Normal. --Basilar artery: Normal. --Superior cerebellar arteries: Normal. --Posterior cerebral arteries (PCA): Normal. ANTERIOR CIRCULATION: --Intracranial internal carotid arteries: There is a stent in the right ICA with an area of surrounding contrast enhancement likely indicating aneurysm or pseudoaneurysm. Greatest width measures 8 mm compared to 6 mm on the pre intervention study. --Anterior cerebral arteries (ACA): Normal. Both A1 segments are present. Patent anterior communicating artery (a-comm). --Middle cerebral arteries (MCA): Normal. VENOUS SINUSES: As permitted by contrast timing, patent. ANATOMIC VARIANTS: None Review of the MIP images confirms the above findings. IMPRESSION: 1. Stent in the right internal carotid artery with an area of surrounding contrast enhancement consistent with pseudoaneurysm, now measuring 8 mm in width, previously 6 mm.  2. Unchanged appearance of intracranial hemorrhage including the predominant  right posterior subdural hematoma. 3. Unchanged appearance of temporal bones and central skull base fractures. 4. Small pleural effusions. Electronically Signed   By: Deatra Robinson M.D.   On: 06/17/2021 00:33   DG CHEST PORT 1 VIEW  Result Date: 06/17/2021 CLINICAL DATA:  Respiratory distress syndrome. EXAM: PORTABLE CHEST 1 VIEW COMPARISON:  Chest x-ray dated June 15, 2021. FINDINGS: Unchanged endotracheal and feeding tubes. Interval removal of the right internal jugular central venous catheter. New right upper extremity PICC line with tip in the proximal right atrium. The heart size and mediastinal contours are within normal limits. Normal pulmonary vascularity. Continued low lung volumes with left greater than right basilar atelectasis and small left pleural effusion. No pneumothorax. No acute osseous abnormality. IMPRESSION: 1. New right upper extremity PICC line with tip in the proximal right atrium. 2. Unchanged bibasilar atelectasis and small left pleural effusion. Electronically Signed   By: Obie Dredge M.D.   On: 06/17/2021 08:37   DG Chest Port 1 View  Result Date: 06/15/2021 CLINICAL DATA:  Respiratory failure EXAM: PORTABLE CHEST 1 VIEW COMPARISON:  Chest x-ray dated 06/14/2021 FINDINGS: 0415 hours. Endotracheal tube is well positioned with tip just above the level of the carina. Enteric tube passes below the diaphragm. RIGHT IJ central line appears well positioned with tip at the level the RIGHT atrium. Heart size and mediastinal contours are stable. Subtle hazy opacities at the LEFT lung base. Lungs otherwise clear. No pleural effusion or pneumothorax is seen. IMPRESSION: 1. Endotracheal tube well positioned with tip just above the level of the carina. 2. Subtle hazy opacities at the LEFT lung base, likely atelectasis and/or small pleural effusion. 3. Lungs otherwise clear. Electronically Signed   By: Bary Richard M.D.   On: 06/15/2021 08:04   DG CHEST PORT 1 VIEW  Result Date: 06/14/2021 CLINICAL DATA:  Encounter for intubation. EXAM: PORTABLE CHEST 1 VIEW COMPARISON:  06/14/2021 FINDINGS: Endotracheal tube is 5.0 cm above the carina. Right jugular central line with the tip in the lower SVC region and stable. Slightly improved aeration at the left lung base. Feeding tube extends into the abdomen but the tip is beyond the image. Patchy densities in right perihilar region could represent atelectasis. Negative for a pneumothorax. IMPRESSION: 1.  Endotracheal tube is appropriately positioned. 2. Improved aeration at the left lung base with residual hazy densities. Findings could represent a combination of atelectasis and pleural fluid. 3. Right perihilar densities may represent atelectasis. Electronically Signed   By: Richarda Overlie M.D.   On: 06/14/2021 16:18   DG Chest Port 1 View  Result Date: 06/14/2021 CLINICAL DATA:  Endotracheal tube advancement EXAM: PORTABLE CHEST 1 VIEW COMPARISON:  06/13/2021 FINDINGS: Endotracheal tube is now approximately 3 cm above the carina. Right IJ central line is unchanged. Enteric tube is within the stomach with tip out of field of view. Persistent small left pleural effusion and retrocardiac atelectasis/consolidation. Stable cardiomediastinal contours. IMPRESSION: Endotracheal tube is now approximately 3 cm above the carina. Persistent small left pleural effusion and retrocardiac atelectasis/consolidation. Electronically Signed   By: Guadlupe Spanish M.D.   On: 06/14/2021 11:58   DG CHEST PORT 1 VIEW  Result Date: 06/13/2021 CLINICAL DATA:  Intubated EXAM: PORTABLE CHEST 1 VIEW COMPARISON:  06/13/2021, CT 06/08/2021 FINDINGS: Endotracheal tube tip is about 5.8 cm superior to carina. Esophageal tube tip below the diaphragm but incompletely visualized. Right IJ central venous catheter tip at the cavoatrial junction. Slight decreased left pleural effusion with improved aeration at  left base. Residual airspace disease. Normal cardiac size. IMPRESSION: 1. Endotracheal tube tip about 5.8 cm superior to carina. 2. Small left-sided pleural effusion with slightly improved aeration at left lung base. There is residual airspace disease Electronically Signed   By: Jasmine Pang M.D.   On: 06/13/2021 18:06   DG CHEST PORT 1 VIEW  Result Date: 06/13/2021 CLINICAL DATA:  Oxygen desaturation, intubated EXAM: PORTABLE CHEST 1 VIEW COMPARISON:  Chest radiograph from earlier today. FINDINGS: Endotracheal tube tip is 5.1 cm above the carina. Enteric tube enters stomach with the tip not seen on this image. Right internal jugular central venous catheter terminates near the cavoatrial junction. Stable cardiomediastinal silhouette with normal heart size. No pneumothorax. Stable small left pleural effusion. No right pleural effusion. Low lung volumes with increased dense left lung base consolidation. Stable mild right basilar atelectasis. IMPRESSION: 1. Well-positioned support structures. No pneumothorax. 2. Low lung volumes with increased dense left lung base consolidation, probably atelectasis and/or aspiration. 3. Stable small left pleural effusion. 4. Stable mild right basilar atelectasis. Electronically Signed   By: Delbert Phenix M.D.   On: 06/13/2021 14:39   IR US CHEST  Result Date: 06/13/2021 CLINICAL DATA:  Evaluate for thoracentesis. EXAM: CHEST ULTRASOUND COMPARISON:  Abdominal CT 06/11/2021 FINDINGS: No significant pleural fluid identified. Thoracentesis not performed due to the small amount of pleural fluid. IMPRESSION: No significant pleural fluid identified. Thoracentesis not performed. Electronically Signed   By: Richarda Overlie M.D.   On: 06/13/2021 18:23   [place] EKG SITE RITE  Result Date: 06/16/2021 If Site Rite image not attached, placement could not be confirmed due to current cardiac rhythm.   Labs:  CBC: Recent Labs    06/14/21 0410 06/15/21 0422 06/15/21 0436 06/16/21 0357  06/16/21 0540 06/17/21 0354 06/17/21 0450  WBC 12.5* 10.9*  --   --  13.7*  --  15.6*  HGB 8.5* 8.7*   < > 8.5* 9.0* 12.6* 9.0*  HCT 26.8* 27.6*   < > 25.0* 28.2* 37.0* 28.7*  PLT 228 242  --   --  289  --  319   < > = values in this interval not displayed.    COAGS: Recent Labs    06/08/21 0245  INR 1.0    BMP: Recent Labs    06/14/21 0410 06/15/21 0422 06/15/21 0436 06/16/21 0357 06/16/21 0540 06/17/21 0354 06/17/21 0450  NA 140 140   < > 141 139 142 140  K 4.0 3.6   < > 3.7 3.8 4.8 5.0  CL 104 103  --   --  101  --  101  CO2 29 30  --   --  30  --  28  GLUCOSE 142* 121*  --   --  112*  --  117*  BUN 19 22*  --   --  41*  --  65*  CALCIUM 8.0* 8.0*  --   --  8.4*  --  8.7*  CREATININE 0.68 0.81  --   --  1.44*  --  2.07*  GFRNONAA >60 >60  --   --  >60  --  46*   < > = values in this interval not displayed.    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 Corliss Skains seen today for follow up. Patient underwent CTA head/neck early this AM to assess stent and pseudoaneurysm - the stent was noted to be appropriately placed however the pseudoaneurysm has enlarged (previously 6 mm, now 8 mm) indicating continued filling defect which will require repeat intervention in NIR. CT images reviewed with patient's parents at bedside today by Dr. Corliss Skains, they are in agreement to proceed with repeat NIR intervention. Reviewed risks of procedure to which they state understanding. Consent signed and in chart.  Patient planned for cerebral arteriogram with right ICA angioplasty/stent reposition/new placement 06/18/21 at 8:30 am with general anesthesia.   NIR will continue to follow, please call with questions or concerns.  Electronically Signed: Villa Herb,  PA-C 06/17/2021, 11:30 AM   I spent a total of 15 Minutes at the the patient's bedside AND on the patient's hospital floor or unit, greater than 50% of which was counseling/coordinating care for right ICA dissection/pseudoaneurysm.

## 2021-06-17 NOTE — Progress Notes (Signed)
Patients HR is holding at upper 120s to 130. Dr. Janee Morn notified orders received. Will continue to monitor.

## 2021-06-17 NOTE — Progress Notes (Signed)
Patient BP has been elevated above 160s. Dr. Janee Morn notified and orders received. Will continue to monitor.
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
## 2021-06-17 NOTE — Anesthesia Preprocedure Evaluation (Addendum)
Anesthesia Evaluation  Patient identified by MRN, date of birth, ID band Patient awake and Patient unresponsive  General Assessment Comment:Case discussed with parents in [id number] and Dr. Corliss Skains. Dr. Chilton Si  Reviewed: Allergy & Precautions, NPO status , Patient's Chart, lab work & pertinent test resultsPreop documentation limited or incomplete due to emergent nature of procedure.  Airway Mallampati: Intubated       Dental   Pulmonary    breath sounds clear to auscultation       Cardiovascular  Rhythm:Regular Rate:Normal  History noted Dr. Chilton Si   Neuro/Psych    GI/Hepatic   Endo/Other    Renal/GU      Musculoskeletal   Abdominal   Peds  Hematology   Anesthesia Other Findings   Reproductive/Obstetrics                             Anesthesia Physical Anesthesia Plan  ASA: 3  Anesthesia Plan: General   Post-op Pain Management:    Induction: Intravenous  PONV Risk Score and Plan: Treatment may vary due to age or medical condition  Airway Management Planned: Oral ETT  Additional Equipment:   Intra-op Plan:   Post-operative Plan: Post-operative intubation/ventilation  Informed Consent: I have reviewed the patients History and Physical, chart, labs and discussed the procedure including the risks, benefits and alternatives for the proposed anesthesia with the patient or authorized representative who has indicated his/her understanding and acceptance.       Plan Discussed with: Anesthesiologist, CRNA and Surgeon  Anesthesia Plan Comments:        Anesthesia Quick Evaluation

## 2021-06-17 NOTE — Progress Notes (Signed)
Transported pt to CT and back with no events to report.
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
## 2021-06-18 ENCOUNTER — Other Ambulatory Visit: Payer: Self-pay | Admitting: Radiology

## 2021-06-18 ENCOUNTER — Inpatient Hospital Stay (HOSPITAL_COMMUNITY): Payer: BC Managed Care – PPO | Admitting: Certified Registered Nurse Anesthetist

## 2021-06-18 ENCOUNTER — Inpatient Hospital Stay (HOSPITAL_COMMUNITY): Payer: BC Managed Care – PPO

## 2021-06-18 ENCOUNTER — Encounter (HOSPITAL_COMMUNITY): Admission: EM | Disposition: A | Discharge status: Rehabilitation Facility | Payer: Self-pay | Source: Home / Self Care

## 2021-06-18 ENCOUNTER — Other Ambulatory Visit (HOSPITAL_COMMUNITY): Payer: Self-pay | Admitting: Emergency Medicine

## 2021-06-18 HISTORY — PX: IR US GUIDE VASC ACCESS RIGHT: IMG2390

## 2021-06-18 HISTORY — PX: IR ANGIO INTRA EXTRACRAN SEL INTERNAL CAROTID UNI R MOD SED: IMG5362

## 2021-06-18 HISTORY — PX: RADIOLOGY WITH ANESTHESIA: SHX6223

## 2021-06-18 HISTORY — PX: IR TRANSCATH/EMBOLIZ: IMG695

## 2021-06-18 HISTORY — PX: IR CT HEAD LTD: IMG2386

## 2021-06-18 LAB — GLUCOSE, CAPILLARY
Glucose-Capillary: 102 mg/dL — ABNORMAL HIGH (ref 70–99)
Glucose-Capillary: 110 mg/dL — ABNORMAL HIGH (ref 70–99)
Glucose-Capillary: 110 mg/dL — ABNORMAL HIGH (ref 70–99)
Glucose-Capillary: 111 mg/dL — ABNORMAL HIGH (ref 70–99)
Glucose-Capillary: 126 mg/dL — ABNORMAL HIGH (ref 70–99)

## 2021-06-18 LAB — PHOSPHORUS: Phosphorus: 4.7 mg/dL — ABNORMAL HIGH (ref 2.5–4.6)

## 2021-06-18 LAB — HEPARIN LEVEL (UNFRACTIONATED): Heparin Unfractionated: 0.14 IU/mL — ABNORMAL LOW (ref 0.30–0.70)

## 2021-06-18 LAB — BASIC METABOLIC PANEL
Anion gap: 9 (ref 5–15)
BUN: 62 mg/dL — ABNORMAL HIGH (ref 6–20)
CO2: 26 mmol/L (ref 22–32)
Calcium: 8.2 mg/dL — ABNORMAL LOW (ref 8.9–10.3)
Chloride: 106 mmol/L (ref 98–111)
Creatinine, Ser: 1.57 mg/dL — ABNORMAL HIGH (ref 0.61–1.24)
GFR, Estimated: >60 mL/min (ref >60)
Glucose, Bld: 109 mg/dL — ABNORMAL HIGH (ref 70–99)
Potassium: 4.7 mmol/L (ref 3.5–5.1)
Sodium: 141 mmol/L (ref 135–145)

## 2021-06-18 LAB — TRIGLYCERIDES
Triglycerides: 557 mg/dL — ABNORMAL HIGH (ref <150)
Triglycerides: 235 mg/dL — ABNORMAL HIGH (ref <150)

## 2021-06-18 LAB — POCT ACTIVATED CLOTTING TIME
Activated Clotting Time: 185 seconds
Activated Clotting Time: 185 seconds
Activated Clotting Time: 185 seconds

## 2021-06-18 LAB — MAGNESIUM: Magnesium: 2.9 mg/dL — ABNORMAL HIGH (ref 1.7–2.4)

## 2021-06-18 SURGERY — IR WITH ANESTHESIA
Anesthesia: General

## 2021-06-18 MED ORDER — LABETALOL HCL 5 MG/ML IV SOLN
INTRAVENOUS | Status: DC | PRN
  Administered 2021-06-18: 5 mg via INTRAVENOUS

## 2021-06-18 MED ORDER — CLEVIDIPINE BUTYRATE 0.5 MG/ML IV EMUL
0.0000 mg/h | INTRAVENOUS | Status: DC
  Administered 2021-06-18: 6 mg/h via INTRAVENOUS
  Administered 2021-06-18: 1 mg/h via INTRAVENOUS
  Administered 2021-06-18: 10 mg/h via INTRAVENOUS
  Administered 2021-06-19: 8 mg/h via INTRAVENOUS
  Administered 2021-06-19: 12 mg/h via INTRAVENOUS
  Administered 2021-06-19: 6 mg/h via INTRAVENOUS
  Filled 2021-06-18 (×5): qty 50

## 2021-06-18 MED ORDER — EPTIFIBATIDE 20 MG/10ML IV SOLN
INTRAVENOUS | Status: DC | PRN
  Administered 2021-06-18 (×2): 1.5 mg via INTRA_ARTERIAL

## 2021-06-18 MED ORDER — NITROGLYCERIN 1 MG/10 ML FOR IR/CATH LAB
INTRA_ARTERIAL | Status: AC
  Filled 2021-06-18: qty 10

## 2021-06-18 MED ORDER — SODIUM CHLORIDE 0.9 % IV SOLN: INTRAVENOUS | Status: DC

## 2021-06-18 MED ORDER — ACETAMINOPHEN 160 MG/5ML PO SOLN
650.0000 mg | ORAL | Status: DC | PRN
  Filled 2021-06-18: qty 20.3

## 2021-06-18 MED ORDER — CEFAZOLIN SODIUM-DEXTROSE 2-4 GM/100ML-% IV SOLN
INTRAVENOUS | Status: AC
  Filled 2021-06-18: qty 100

## 2021-06-18 MED ORDER — ENOXAPARIN SODIUM 40 MG/0.4ML IJ SOSY
40.0000 mg | PREFILLED_SYRINGE | Freq: Two times a day (BID) | INTRAMUSCULAR | Status: DC
  Administered 2021-06-18: 40 mg via SUBCUTANEOUS
  Filled 2021-06-18: qty 0.4

## 2021-06-18 MED ORDER — LACTATED RINGERS IV BOLUS
1000.0000 mL | Freq: Once | INTRAVENOUS | Status: AC
  Administered 2021-06-18: 1000 mL via INTRAVENOUS

## 2021-06-18 MED ORDER — TICAGRELOR 90 MG PO TABS
90.0000 mg | ORAL_TABLET | Freq: Two times a day (BID) | ORAL | Status: DC
  Filled 2021-06-18 (×4): qty 1

## 2021-06-18 MED ORDER — MIDAZOLAM HCL 2 MG/2ML IJ SOLN
INTRAMUSCULAR | Status: AC
  Filled 2021-06-18: qty 2

## 2021-06-18 MED ORDER — ONDANSETRON HCL 4 MG/2ML IJ SOLN
INTRAMUSCULAR | Status: DC | PRN
  Administered 2021-06-18: 4 mg via INTRAVENOUS

## 2021-06-18 MED ORDER — ACETAMINOPHEN 650 MG RE SUPP: 650.0000 mg | RECTAL | Status: DC | PRN

## 2021-06-18 MED ORDER — HEPARIN SODIUM (PORCINE) 1000 UNIT/ML IJ SOLN
INTRAMUSCULAR | Status: DC | PRN
  Administered 2021-06-18: 1000 [IU] via INTRAVENOUS
  Administered 2021-06-18: 500 [IU] via INTRAVENOUS
  Administered 2021-06-18: 3000 [IU] via INTRAVENOUS

## 2021-06-18 MED ORDER — TICAGRELOR 90 MG PO TABS
90.0000 mg | ORAL_TABLET | Freq: Two times a day (BID) | ORAL | Status: DC
  Administered 2021-06-18 – 2021-06-27 (×17): 90 mg
  Filled 2021-06-18 (×19): qty 1

## 2021-06-18 MED ORDER — EPTIFIBATIDE 20 MG/10ML IV SOLN
INTRAVENOUS | Status: AC
  Filled 2021-06-18: qty 10

## 2021-06-18 MED ORDER — LIDOCAINE HCL 1 % IJ SOLN
INTRAMUSCULAR | Status: AC
  Administered 2021-06-18: 10 mL
  Filled 2021-06-18: qty 20

## 2021-06-18 MED ORDER — ASPIRIN 81 MG PO CHEW
81.0000 mg | CHEWABLE_TABLET | Freq: Every day | ORAL | Status: DC
  Administered 2021-06-19 – 2021-06-27 (×8): 81 mg
  Filled 2021-06-18 (×10): qty 1

## 2021-06-18 MED ORDER — HEPARIN (PORCINE) 25000 UT/250ML-% IV SOLN
750.0000 [IU]/h | INTRAVENOUS | Status: DC
  Administered 2021-06-18: 750 [IU]/h via INTRAVENOUS
  Filled 2021-06-18: qty 250

## 2021-06-18 MED ORDER — ACETAMINOPHEN 325 MG PO TABS: 650.0000 mg | ORAL_TABLET | ORAL | Status: DC | PRN

## 2021-06-18 MED ORDER — PHENYLEPHRINE 40 MCG/ML (10ML) SYRINGE FOR IV PUSH (FOR BLOOD PRESSURE SUPPORT)
PREFILLED_SYRINGE | INTRAVENOUS | Status: DC | PRN
  Administered 2021-06-18: 80 ug via INTRAVENOUS

## 2021-06-18 MED ORDER — IOHEXOL 300 MG/ML  SOLN
100.0000 mL | Freq: Once | INTRAMUSCULAR | Status: AC | PRN
  Administered 2021-06-18: 100 mL via INTRA_ARTERIAL

## 2021-06-18 MED ORDER — ROCURONIUM BROMIDE 10 MG/ML (PF) SYRINGE
PREFILLED_SYRINGE | INTRAVENOUS | Status: DC | PRN
  Administered 2021-06-18 (×3): 30 mg via INTRAVENOUS
  Administered 2021-06-18: 50 mg via INTRAVENOUS

## 2021-06-18 MED ORDER — FENTANYL CITRATE (PF) 250 MCG/5ML IJ SOLN
INTRAMUSCULAR | Status: DC | PRN
  Administered 2021-06-18: 50 ug via INTRAVENOUS

## 2021-06-18 MED ORDER — ASPIRIN 81 MG PO CHEW
81.0000 mg | CHEWABLE_TABLET | Freq: Every day | ORAL | Status: DC
  Filled 2021-06-18: qty 1

## 2021-06-18 MED ORDER — FENTANYL CITRATE (PF) 250 MCG/5ML IJ SOLN
INTRAMUSCULAR | Status: AC
  Filled 2021-06-18: qty 5

## 2021-06-18 MED ORDER — LACTATED RINGERS IV BOLUS
500.0000 mL | Freq: Once | INTRAVENOUS | Status: AC
  Administered 2021-06-18: 500 mL via INTRAVENOUS

## 2021-06-18 NOTE — Anesthesia Procedure Notes (Signed)
Arterial Line Insertion Start/End12/01/2021 11:35 AM, 06/18/2021 11:40 AM Performed by: Dorris Singh, MD, Zollie Beckers, CRNA, CRNA  Patient location: OOR procedure area. Preanesthetic checklist: patient identified, IV checked, site marked, risks and benefits discussed, surgical consent, monitors and equipment checked, pre-op evaluation, timeout performed and anesthesia consent Left, radial was placed Catheter size: 20 G Hand hygiene performed  and maximum sterile barriers used   Attempts: 1 Procedure performed without using ultrasound guided technique. Following insertion, Biopatch and dressing applied. Patient tolerated the procedure well with no immediate complications.

## 2021-06-18 NOTE — Progress Notes (Signed)
Referring Physician(s): Dr Elwyn Lade  Supervising Physician: Julieanne Cotton  Patient Status:  [facility] - In-pt  Chief Complaint:  RICA dissection  Subjective:  Stent placed 06/13/21 in IR Enlarging pseudoaneurysm per imaging Scheduled now for additional intervention  Intubated No response Rt pupil larger than left   Allergies: Patient has no known allergies.  Medications: Prior to Admission medications   Not on File     Vital Signs: BP (!) 147/85   Pulse (!) 108   Temp 98 F (36.7 C) (Axillary)   Resp (!) 28   Ht 5\' 10"  (1.778 m)   Wt 213 lb 10 oz (96.9 kg)   SpO2 96%   BMI 30.65 kg/m   Physical Exam Vitals reviewed.  Constitutional:      Comments: No response  HENT:     Mouth/Throat:     Comments: Intubated  Eyes:     Comments: Rt pupil larger than left  Cardiovascular:     Rate and Rhythm: Normal rate.  Pulmonary:     Comments: vent Abdominal:     Palpations: Abdomen is soft.  Skin:    General: Skin is warm.    Imaging: CT ANGIO HEAD NECK W WO CM  Result Date: 06/17/2021 CLINICAL DATA:  Stroke follow-up EXAM: CT ANGIOGRAPHY HEAD AND NECK TECHNIQUE: Multidetector CT imaging of the head and neck was performed using the standard protocol during bolus administration of intravenous contrast. Multiplanar CT image reconstructions and MIPs were obtained to evaluate the vascular anatomy. Carotid stenosis measurements (when applicable) are obtained utilizing NASCET criteria, using the distal internal carotid diameter as the denominator. CONTRAST:  14/12/2020 OMNIPAQUE IOHEXOL 300 MG/ML  SOLN COMPARISON:  06/12/2021 head CT CTA head neck 06/09/2021 FINDINGS: CT HEAD FINDINGS Brain: Unchanged appearance of small right posterior convexity subdural hematoma. There is blood within both occipital horns of the lateral ventricles. There is no acute or chronic infarction. Skull: Unchanged appearance of fractures involving the temporal bones and central skull base.  Sinuses/Orbits: Bilateral mastoid effusions, likely blood. Partial opacification of the paranasal sinuses. The orbits are normal. CTA NECK FINDINGS SKELETON: There is no bony spinal canal stenosis. No lytic or blastic lesion. OTHER NECK: Normal pharynx, larynx and major salivary glands. No cervical lymphadenopathy. Unremarkable thyroid gland. UPPER CHEST: Small pleural effusions AORTIC ARCH: There is no calcific atherosclerosis of the aortic arch. There is no aneurysm, dissection or hemodynamically significant stenosis of the visualized portion of the aorta. Conventional 3 vessel aortic branching pattern. The visualized proximal subclavian arteries are widely patent. RIGHT CAROTID SYSTEM: Normal without aneurysm, dissection or stenosis. LEFT CAROTID SYSTEM: Normal without aneurysm, dissection or stenosis. VERTEBRAL ARTERIES: Left dominant configuration. Both origins are clearly patent. There is no dissection, occlusion or flow-limiting stenosis to the skull base (V1-V3 segments). CTA HEAD FINDINGS POSTERIOR CIRCULATION: --Vertebral arteries: Normal V4 segments. --Inferior cerebellar arteries: Normal. --Basilar artery: Normal. --Superior cerebellar arteries: Normal. --Posterior cerebral arteries (PCA): Normal. ANTERIOR CIRCULATION: --Intracranial internal carotid arteries: There is a stent in the right ICA with an area of surrounding contrast enhancement likely indicating aneurysm or pseudoaneurysm. Greatest width measures 8 mm compared to 6 mm on the pre intervention study. --Anterior cerebral arteries (ACA): Normal. Both A1 segments are present. Patent anterior communicating artery (a-comm). --Middle cerebral arteries (MCA): Normal. VENOUS SINUSES: As permitted by contrast timing, patent. ANATOMIC VARIANTS: None Review of the MIP images confirms the above findings. IMPRESSION: 1. Stent in the right internal carotid artery with an area of surrounding contrast enhancement  consistent with pseudoaneurysm, now measuring  8 mm in width, previously 6 mm. 2. Unchanged appearance of intracranial hemorrhage including the predominant right posterior subdural hematoma. 3. Unchanged appearance of temporal bones and central skull base fractures. 4. Small pleural effusions. Electronically Signed   By: Deatra Robinson M.D.   On: 06/17/2021 00:33   DG CHEST PORT 1 VIEW  Result Date: 06/17/2021 CLINICAL DATA:  Respiratory distress syndrome. EXAM: PORTABLE CHEST 1 VIEW COMPARISON:  Chest x-ray dated June 15, 2021. FINDINGS: Unchanged endotracheal and feeding tubes. Interval removal of the right internal jugular central venous catheter. New right upper extremity PICC line with tip in the proximal right atrium. The heart size and mediastinal contours are within normal limits. Normal pulmonary vascularity. Continued low lung volumes with left greater than right basilar atelectasis and small left pleural effusion. No pneumothorax. No acute osseous abnormality. IMPRESSION: 1. New right upper extremity PICC line with tip in the proximal right atrium. 2. Unchanged bibasilar atelectasis and small left pleural effusion. Electronically Signed   By: Obie Dredge M.D.   On: 06/17/2021 08:37   DG Chest Port 1 View  Result Date: 06/15/2021 CLINICAL DATA:  Respiratory failure EXAM: PORTABLE CHEST 1 VIEW COMPARISON:  Chest x-ray dated 06/14/2021 FINDINGS: 0415 hours. Endotracheal tube is well positioned with tip just above the level of the carina. Enteric tube passes below the diaphragm. RIGHT IJ central line appears well positioned with tip at the level the RIGHT atrium. Heart size and mediastinal contours are stable. Subtle hazy opacities at the LEFT lung base. Lungs otherwise clear. No pleural effusion or pneumothorax is seen. IMPRESSION: 1. Endotracheal tube well positioned with tip just above the level of the carina. 2. Subtle hazy opacities at the LEFT lung base, likely atelectasis and/or small pleural effusion. 3. Lungs otherwise clear.  Electronically Signed   By: Bary Richard M.D.   On: 06/15/2021 08:04   DG CHEST PORT 1 VIEW  Result Date: 06/14/2021 CLINICAL DATA:  Encounter for intubation. EXAM: PORTABLE CHEST 1 VIEW COMPARISON:  06/14/2021 FINDINGS: Endotracheal tube is 5.0 cm above the carina. Right jugular central line with the tip in the lower SVC region and stable. Slightly improved aeration at the left lung base. Feeding tube extends into the abdomen but the tip is beyond the image. Patchy densities in right perihilar region could represent atelectasis. Negative for a pneumothorax. IMPRESSION: 1.  Endotracheal tube is appropriately positioned. 2. Improved aeration at the left lung base with residual hazy densities. Findings could represent a combination of atelectasis and pleural fluid. 3. Right perihilar densities may represent atelectasis. Electronically Signed   By: Richarda Overlie M.D.   On: 06/14/2021 16:18   DG Chest Port 1 View  Result Date: 06/14/2021 CLINICAL DATA:  Endotracheal tube advancement EXAM: PORTABLE CHEST 1 VIEW COMPARISON:  06/13/2021 FINDINGS: Endotracheal tube is now approximately 3 cm above the carina. Right IJ central line is unchanged. Enteric tube is within the stomach with tip out of field of view. Persistent small left pleural effusion and retrocardiac atelectasis/consolidation. Stable cardiomediastinal contours. IMPRESSION: Endotracheal tube is now approximately 3 cm above the carina. Persistent small left pleural effusion and retrocardiac atelectasis/consolidation. Electronically Signed   By: Guadlupe Spanish M.D.   On: 06/14/2021 11:58   [place] EKG SITE RITE  Result Date: 06/16/2021 If Site Rite image not attached, placement could not be confirmed due to current cardiac rhythm.   Labs:  CBC: Recent Labs    06/14/21 0410 06/15/21 0422 06/15/21 3532  06/16/21 0357 06/16/21 0540 06/17/21 0354 06/17/21 0450  WBC 12.5* 10.9*  --   --  13.7*  --  15.6*  HGB 8.5* 8.7*   < > 8.5* 9.0* 12.6* 9.0*   HCT 26.8* 27.6*   < > 25.0* 28.2* 37.0* 28.7*  PLT 228 242  --   --  289  --  319   < > = values in this interval not displayed.    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  This interventional procedure involves the use of X-rays and because of the nature of the planned procedure, it is possible that we will have prolonged use of X-ray fluoroscopy.  Potential radiation risks to you include (but are not limited to) the following: - A slightly elevated risk for cancer  several years later in life. This risk is typically less than 0.5% percent. This risk is low in comparison to the normal incidence of human cancer, which is 33% for women and 50% for men according to the [employer]. - Radiation induced injury can include skin redness, resembling a rash, tissue breakdown / ulcers and hair loss (which can be temporary or permanent).   The likelihood of either of these occurring depends on the difficulty of the procedure and whether you are sensitive to radiation due to previous procedures, disease, or genetic conditions.   IF  your procedure requires a prolonged use of radiation, you will be notified and given written instructions for further action.  It is your responsibility to monitor the irradiated area for the 2 weeks following the procedure and to notify your physician if you are concerned that you have suffered a radiation induced injury.    All of the patient's questions were answered, patient is agreeable to proceed.  Consent signed and in chart.   Electronically Signed: 14/1/22, PA-C 06/18/2021, 7:11 AM   I spent a total of 15 Minutes at the the patient's bedside AND on the patient's hospital floor or unit, greater than 50% of which was counseling/coordinating care for R ICA dissection intervention

## 2021-06-18 NOTE — Progress Notes (Signed)
Patient ID: Jason [name], male   DOB: March 05, 2001, 21 y.o.   MRN: [id number] Pt seen this pm with Dr. 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 Placement of x 2 pipeline shield flow diverters across the fistulous communication and the pseudoaneurysm  today; cont current tx, neuro checks; on ASA/brilinta/IV heparin. Latest imaging /procedure details reviewed with parents.
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
## 2021-06-18 NOTE — Sedation Documentation (Signed)
Pipleline placed via Dr. Corliss Skains.

## 2021-06-18 NOTE — Sedation Documentation (Signed)
2nd Pipeline deployed.

## 2021-06-18 NOTE — Progress Notes (Signed)
Spoke with Dr. Bedelia Person about HR sustaining in the 120s after giving fentanyl bolus and PRN oxycodone. MD stated she is okay with tachycardia as long as patient's HR stays under 130.   Beryl Meager, RN

## 2021-06-18 NOTE — Sedation Documentation (Signed)
At 1424 ACT was 185, communicated with Dr. Corliss Skains.  CRNA gave 500U Heparin.

## 2021-06-18 NOTE — Progress Notes (Signed)
ANTICOAGULATION CONSULT NOTE - Follow Up Consult  Pharmacy Consult for heparin Indication:  s/p common carotid arteriogram  Labs: Recent Labs    06/16/21 0540 06/17/21 0354 06/17/21 0450 06/17/21 1410 06/18/21 0355 06/18/21 2203  HGB 9.0* 12.6* 9.0*  --   --   --   HCT 28.2* 37.0* 28.7*  --   --   --   PLT 289  --  319  --   --   --   HEPARINUNFRC  --   --   --   --   --  0.14*  CREATININE 1.44*  --  2.07* 1.88* 1.57*  --     Assessment/Plan:  20yo male therapeutic on heparin with initial dosing s/p IR neuro procedure. Will continue infusion at current rate of 750 units/hr until off in am.   Vernard Gambles, PharmD, BCPS  06/18/2021,11:52 PM

## 2021-06-18 NOTE — Sedation Documentation (Signed)
ACT 185, given 1000 Heparin with Anesthesia

## 2021-06-18 NOTE — Procedures (Signed)
RT common carotid arteriogram. RT CFA approach. Findings. Interval enlargement of previous RT ICA intracranial aneurysm secondary to dissection ,and development of of a direct CCF decompressing into the post compartment of RT cavernous sinus.. Placement of x 2 pipeline shield flow diverters across the fistulous communication and the pseudoaneurysm . Post CT brain No ICH .  Blood loss . Minimal  Distal pulses intact. Contrast 100 cc  Omnipaque3 00. Pupils RT 4 mm  LT 3 mm sluggish. S.Lillie Portner MD.

## 2021-06-18 NOTE — Progress Notes (Signed)
HR sustained above 130 with MD (Dr. Bedelia Person) at bedside. MD ordered 500 mL bolus of LR. If no improvement, MD believes it is a sedation issue.    Bolus started.      Beryl Meager, RN

## 2021-06-18 NOTE — Progress Notes (Signed)
Pt transported to IR on full vent support. No complications noted.

## 2021-06-18 NOTE — Transfer of Care (Signed)
Immediate Anesthesia Transfer of Care Note  Patient: Jason [name]  Procedure(s) Performed: EMBOLIZATION  Patient Location: PACU and ICU  Anesthesia Type:General  Level of Consciousness: sedated and Patient remains intubated per anesthesia plan  Airway & Oxygen Therapy: Patient remains intubated per anesthesia plan and Patient placed on Ventilator (see vital sign flow sheet for setting)  Post-op Assessment: Report given to RN and Post -op Vital signs reviewed and stable  Post vital signs: Reviewed and stable  Last Vitals:  Vitals Value Taken Time  BP    Temp    Pulse 115 06/18/21 1513  Resp 18 06/18/21 1513  SpO2 93 % 06/18/21 1513  Vitals shown include unvalidated device data.  Last Pain:  Vitals:   06/18/21 0800  TempSrc: Esophageal         Complications: No notable events documented.

## 2021-06-18 NOTE — Anesthesia Postprocedure Evaluation (Signed)
Anesthesia Post Note  Patient: Jason [name]  Procedure(s) Performed: EMBOLIZATION     Patient location during evaluation: ICU Anesthesia Type: General Level of consciousness: patient remains intubated per anesthesia plan Pain management: pain level controlled Vital Signs Assessment: post-procedure vital signs reviewed and stable Respiratory status: patient remains intubated per anesthesia plan Postop Assessment: no apparent nausea or vomiting Anesthetic complications: no   No notable events documented.  Last Vitals:  Vitals:   06/18/21 1645 06/18/21 1700  BP: 139/72 132/74  Pulse: (!) 126 (!) 124  Resp: (!) 30 (!) 28  Temp:    SpO2: 92% (!) 87%    Last Pain:  Vitals:   06/18/21 0800  TempSrc: Esophageal                 Jason [name]

## 2021-06-19 ENCOUNTER — Encounter (HOSPITAL_COMMUNITY): Payer: Self-pay | Admitting: Interventional Radiology

## 2021-06-19 LAB — CBC
HCT: 21.4 % — ABNORMAL LOW (ref 39.0–52.0)
HCT: 24.4 % — ABNORMAL LOW (ref 39.0–52.0)
Hemoglobin: 6.6 g/dL — CL (ref 13.0–17.0)
Hemoglobin: 7.6 g/dL — ABNORMAL LOW (ref 13.0–17.0)
MCH: 29.5 pg (ref 26.0–34.0)
MCH: 29.3 pg (ref 26.0–34.0)
MCHC: 30.8 g/dL (ref 30.0–36.0)
MCHC: 31.1 g/dL (ref 30.0–36.0)
MCV: 95.5 fL (ref 80.0–100.0)
MCV: 94.2 fL (ref 80.0–100.0)
Platelets: 291 10*3/uL (ref 150–400)
Platelets: 305 10*3/uL (ref 150–400)
RBC: 2.24 MIL/uL — ABNORMAL LOW (ref 4.22–5.81)
RBC: 2.59 MIL/uL — ABNORMAL LOW (ref 4.22–5.81)
RDW: 14.2 % (ref 11.5–15.5)
RDW: 14.4 % (ref 11.5–15.5)
WBC: 16.8 10*3/uL — ABNORMAL HIGH (ref 4.0–10.5)
WBC: 17.6 10*3/uL — ABNORMAL HIGH (ref 4.0–10.5)
nRBC: 0 % (ref 0.0–0.2)
nRBC: 0 % (ref 0.0–0.2)

## 2021-06-19 LAB — CBC WITH DIFFERENTIAL/PLATELET
Abs Immature Granulocytes: 0.56 10*3/uL — ABNORMAL HIGH (ref 0.00–0.07)
Basophils Absolute: 0.1 10*3/uL (ref 0.0–0.1)
Basophils Relative: 0 %
Eosinophils Absolute: 1.1 10*3/uL — ABNORMAL HIGH (ref 0.0–0.5)
Eosinophils Relative: 5 %
HCT: 23.7 % — ABNORMAL LOW (ref 39.0–52.0)
Hemoglobin: 7.3 g/dL — ABNORMAL LOW (ref 13.0–17.0)
Immature Granulocytes: 3 %
Lymphocytes Relative: 10 %
Lymphs Abs: 2 10*3/uL (ref 0.7–4.0)
MCH: 29.4 pg (ref 26.0–34.0)
MCHC: 30.8 g/dL (ref 30.0–36.0)
MCV: 95.6 fL (ref 80.0–100.0)
Monocytes Absolute: 1.3 10*3/uL — ABNORMAL HIGH (ref 0.1–1.0)
Monocytes Relative: 7 %
Neutro Abs: 14.8 10*3/uL — ABNORMAL HIGH (ref 1.7–7.7)
Neutrophils Relative %: 75 %
Platelets: 317 10*3/uL (ref 150–400)
RBC: 2.48 MIL/uL — ABNORMAL LOW (ref 4.22–5.81)
RDW: 14.1 % (ref 11.5–15.5)
WBC: 19.8 10*3/uL — ABNORMAL HIGH (ref 4.0–10.5)
nRBC: 0 % (ref 0.0–0.2)

## 2021-06-19 LAB — BASIC METABOLIC PANEL
Anion gap: 12 (ref 5–15)
BUN: 75 mg/dL — ABNORMAL HIGH (ref 6–20)
CO2: 21 mmol/L — ABNORMAL LOW (ref 22–32)
Calcium: 7.9 mg/dL — ABNORMAL LOW (ref 8.9–10.3)
Chloride: 111 mmol/L (ref 98–111)
Creatinine, Ser: 2.27 mg/dL — ABNORMAL HIGH (ref 0.61–1.24)
GFR, Estimated: 41 mL/min — ABNORMAL LOW (ref >60)
Glucose, Bld: 125 mg/dL — ABNORMAL HIGH (ref 70–99)
Potassium: 4.4 mmol/L (ref 3.5–5.1)
Sodium: 144 mmol/L (ref 135–145)

## 2021-06-19 LAB — GLUCOSE, CAPILLARY
Glucose-Capillary: 101 mg/dL — ABNORMAL HIGH (ref 70–99)
Glucose-Capillary: 102 mg/dL — ABNORMAL HIGH (ref 70–99)
Glucose-Capillary: 110 mg/dL — ABNORMAL HIGH (ref 70–99)
Glucose-Capillary: 103 mg/dL — ABNORMAL HIGH (ref 70–99)
Glucose-Capillary: 117 mg/dL — ABNORMAL HIGH (ref 70–99)
Glucose-Capillary: 123 mg/dL — ABNORMAL HIGH (ref 70–99)

## 2021-06-19 LAB — CULTURE, RESPIRATORY W GRAM STAIN

## 2021-06-19 LAB — PREPARE RBC (CROSSMATCH)

## 2021-06-19 MED ORDER — SODIUM CHLORIDE 0.9 % IV SOLN
2.0000 g | INTRAVENOUS | Status: AC
  Administered 2021-06-19 – 2021-06-23 (×5): 2 g via INTRAVENOUS
  Filled 2021-06-19 (×5): qty 20

## 2021-06-19 MED ORDER — METOPROLOL TARTRATE 25 MG/10 ML ORAL SUSPENSION
25.0000 mg | Freq: Two times a day (BID) | ORAL | Status: DC
  Administered 2021-06-19 (×2): 25 mg
  Filled 2021-06-19 (×2): qty 10

## 2021-06-19 MED ORDER — SODIUM CHLORIDE 0.9% IV SOLUTION: Freq: Once | INTRAVENOUS | Status: AC

## 2021-06-19 MED ORDER — AMLODIPINE BESYLATE 5 MG PO TABS
5.0000 mg | ORAL_TABLET | Freq: Every day | ORAL | Status: DC
  Administered 2021-06-19 – 2021-06-27 (×8): 5 mg
  Filled 2021-06-19 (×10): qty 1

## 2021-06-19 MED ORDER — CLONAZEPAM 1 MG PO TABS
1.0000 mg | ORAL_TABLET | Freq: Three times a day (TID) | ORAL | Status: DC
  Administered 2021-06-19 – 2021-06-20 (×3): 1 mg
  Filled 2021-06-19 (×3): qty 1

## 2021-06-19 MED ORDER — SODIUM CHLORIDE 0.9 % IV BOLUS
1000.0000 mL | Freq: Once | INTRAVENOUS | Status: AC
  Administered 2021-06-19: 1000 mL via INTRAVENOUS

## 2021-06-19 NOTE — Progress Notes (Signed)
Trauma Response Nurse Note-  Reason for Call / Reason for Trauma activation:   -pt has a heart rate in the 120s and a BP of 171/83 (based off the art line)   Plan of Care as of this note:  - Primary RN to continue to monitor patient.   Event Summary:   - Primary RN contacted TRN with a concern made by family. Familles concern was a heart rate in the 120s and a BP of 171/83, based on the arterial line. Primary RN informed TRN that patient has metoprolol  ordered q6hr. Trauma provider was contacted and stated "The heart rate is lower than 2 hours ago. Continue to monitor." Primary RN was informed.   The Following (if applicable):    -MD notified: Dr. Sheliah Hatch

## 2021-06-19 NOTE — Progress Notes (Signed)
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 Per RN currently weaning sedation. Dad at bedside today.  Allergies: Patient has no known allergies.  Medications: Prior to Admission medications   Not on File     Vital Signs: BP 122/63 (BP Location: Left Arm)   Pulse (!) 112   Temp 99.4 F (37.4 C) (Axillary)   Resp (!) 27   Ht 5\' 10"  (1.778 m)   Wt 221 lb 1.9 oz (100.3 kg)   SpO2 96%   BMI 31.73 kg/m   Physical Exam Vitals and nursing note reviewed.  Constitutional:      Comments: Sedated, intubated  Cardiovascular:     Rate and Rhythm: Tachycardia present.  Pulmonary:     Comments: Ventilated Skin:    General: Skin is warm and dry.  Neurological:     Comments: Right pupil 4 mm, left pupil 2 mm. PERRL bilaterally    Imaging: CT ANGIO HEAD NECK W WO CM  Result Date: 06/17/2021 CLINICAL DATA:  Stroke follow-up EXAM: CT ANGIOGRAPHY HEAD AND NECK TECHNIQUE: Multidetector CT imaging of the head and neck was performed using the standard protocol during bolus administration of intravenous contrast. Multiplanar CT image reconstructions and MIPs were obtained to evaluate the vascular anatomy. Carotid stenosis measurements (when applicable) are obtained utilizing NASCET criteria, using the distal internal carotid diameter as the denominator. CONTRAST:  14/12/2020 OMNIPAQUE IOHEXOL 300 MG/ML  SOLN COMPARISON:  06/12/2021 head CT CTA head neck 06/09/2021 FINDINGS: CT HEAD FINDINGS Brain: Unchanged appearance of small right posterior convexity subdural hematoma. There is blood within both occipital horns of the lateral ventricles. There is no acute or chronic infarction. Skull:  Unchanged appearance of fractures involving the temporal bones and central skull base. Sinuses/Orbits: Bilateral mastoid effusions, likely blood. Partial opacification of the paranasal sinuses. The orbits are normal. CTA NECK FINDINGS SKELETON: There is no bony spinal canal stenosis. No lytic or blastic lesion. OTHER NECK: Normal pharynx, larynx and major salivary glands. No cervical lymphadenopathy. Unremarkable thyroid gland. UPPER CHEST: Small pleural effusions AORTIC ARCH: There is no calcific atherosclerosis of the aortic arch. There is no aneurysm, dissection or hemodynamically significant stenosis of the visualized portion of the aorta. Conventional 3 vessel aortic branching pattern. The visualized proximal subclavian arteries are widely patent. RIGHT CAROTID SYSTEM: Normal without aneurysm, dissection or stenosis. LEFT CAROTID SYSTEM: Normal without aneurysm, dissection or stenosis. VERTEBRAL ARTERIES: Left dominant configuration. Both origins are clearly patent. There is no dissection, occlusion or flow-limiting stenosis to the skull base (V1-V3 segments). CTA HEAD FINDINGS POSTERIOR CIRCULATION: --Vertebral arteries: Normal V4 segments. --Inferior cerebellar arteries: Normal. --Basilar artery: Normal. --Superior cerebellar arteries: Normal. --Posterior cerebral arteries (PCA): Normal. ANTERIOR CIRCULATION: --Intracranial internal carotid arteries: There is a stent in the right ICA with an area of surrounding contrast enhancement likely indicating aneurysm or pseudoaneurysm. Greatest width measures 8 mm compared to 6 mm on the pre intervention study. --Anterior cerebral arteries (ACA): Normal. Both A1 segments are present. Patent anterior communicating artery (a-comm). --Middle cerebral arteries (MCA): Normal. VENOUS SINUSES: As permitted by contrast timing, patent. ANATOMIC VARIANTS: None Review of the MIP images confirms the above findings. IMPRESSION: 1. Stent in the right internal carotid artery with an  area of surrounding contrast enhancement consistent  with pseudoaneurysm, now measuring 8 mm in width, previously 6 mm. 2. Unchanged appearance of intracranial hemorrhage including the predominant right posterior subdural hematoma. 3. Unchanged appearance of temporal bones and central skull base fractures. 4. Small pleural effusions. Electronically Signed   By: Deatra Robinson M.D.   On: 06/17/2021 00:33   DG CHEST PORT 1 VIEW  Result Date: 06/17/2021 CLINICAL DATA:  Respiratory distress syndrome. EXAM: PORTABLE CHEST 1 VIEW COMPARISON:  Chest x-ray dated June 15, 2021. FINDINGS: Unchanged endotracheal and feeding tubes. Interval removal of the right internal jugular central venous catheter. New right upper extremity PICC line with tip in the proximal right atrium. The heart size and mediastinal contours are within normal limits. Normal pulmonary vascularity. Continued low lung volumes with left greater than right basilar atelectasis and small left pleural effusion. No pneumothorax. No acute osseous abnormality. IMPRESSION: 1. New right upper extremity PICC line with tip in the proximal right atrium. 2. Unchanged bibasilar atelectasis and small left pleural effusion. Electronically Signed   By: Obie Dredge M.D.   On: 06/17/2021 08:37   [place] EKG SITE RITE  Result Date: 06/16/2021 If Site Rite image not attached, placement could not be confirmed due to current cardiac rhythm.   Labs:  CBC: Recent Labs    06/15/21 0422 06/15/21 0436 06/16/21 0540 06/17/21 0354 06/17/21 0450 06/19/21 0622  WBC 10.9*  --  13.7*  --  15.6* 19.8*  HGB 8.7*   < > 9.0* 12.6* 9.0* 7.3*  HCT 27.6*   < > 28.2* 37.0* 28.7* 23.7*  PLT 242  --  289  --  319 317   < > = values in this interval not displayed.    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  Per RN weaning sedation today, R pupil > L pupil now appears to be 4 > 2, reactive to light bilaterally.  Plan to repeat CTA head/neck 1 week from procedure (12/14 - order placed). Continue ASA 81 mg QD + Brilinta 90 mg BID.  NIR will continue to follow along, please call with questions or concerns.  Electronically Signed: 12-24-1993, PA-C 06/19/2021, 12:38 PM   I spent a total of 15 Minutes at the the patient's bedside AND on the patient's hospital floor or unit, greater than 50% of which was counseling/coordinating care for right ICA dissection/pseudoaneurysm.

## 2021-06-19 NOTE — Progress Notes (Signed)
Wasted 19 mL of fentanyl from previous tubing with Jolinda Croak.
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
## 2021-06-19 NOTE — Progress Notes (Signed)
Date and time results received: 06/19/21 2:48 PM  (use smartphrase ".now" to insert current time)  Test: Hgb  Critical Value: 6.6  Name of Provider Notified: Dr Janee Morn  Orders Received? Or Actions Taken?: 1 u RBC ordered.

## 2021-06-20 LAB — TRIGLYCERIDES: Triglycerides: 206 mg/dL — ABNORMAL HIGH (ref <150)

## 2021-06-20 LAB — BASIC METABOLIC PANEL
Anion gap: 7 (ref 5–15)
BUN: 74 mg/dL — ABNORMAL HIGH (ref 6–20)
CO2: 21 mmol/L — ABNORMAL LOW (ref 22–32)
Calcium: 8 mg/dL — ABNORMAL LOW (ref 8.9–10.3)
Chloride: 121 mmol/L — ABNORMAL HIGH (ref 98–111)
Creatinine, Ser: 2.27 mg/dL — ABNORMAL HIGH (ref 0.61–1.24)
GFR, Estimated: 41 mL/min — ABNORMAL LOW (ref >60)
Glucose, Bld: 118 mg/dL — ABNORMAL HIGH (ref 70–99)
Potassium: 4.2 mmol/L (ref 3.5–5.1)
Sodium: 149 mmol/L — ABNORMAL HIGH (ref 135–145)

## 2021-06-20 LAB — CBC
HCT: 25.2 % — ABNORMAL LOW (ref 39.0–52.0)
Hemoglobin: 7.6 g/dL — ABNORMAL LOW (ref 13.0–17.0)
MCH: 28.8 pg (ref 26.0–34.0)
MCHC: 30.2 g/dL (ref 30.0–36.0)
MCV: 95.5 fL (ref 80.0–100.0)
Platelets: 331 10*3/uL (ref 150–400)
RBC: 2.64 MIL/uL — ABNORMAL LOW (ref 4.22–5.81)
RDW: 14.6 % (ref 11.5–15.5)
WBC: 15.7 10*3/uL — ABNORMAL HIGH (ref 4.0–10.5)
nRBC: 0 % (ref 0.0–0.2)

## 2021-06-20 LAB — GLUCOSE, CAPILLARY
Glucose-Capillary: 100 mg/dL — ABNORMAL HIGH (ref 70–99)
Glucose-Capillary: 104 mg/dL — ABNORMAL HIGH (ref 70–99)
Glucose-Capillary: 105 mg/dL — ABNORMAL HIGH (ref 70–99)
Glucose-Capillary: 111 mg/dL — ABNORMAL HIGH (ref 70–99)
Glucose-Capillary: 111 mg/dL — ABNORMAL HIGH (ref 70–99)
Glucose-Capillary: 120 mg/dL — ABNORMAL HIGH (ref 70–99)

## 2021-06-20 LAB — BPAM RBC
Blood Product Expiration Date: 202301022359
ISSUE DATE / TIME: 202212081632
Unit Type and Rh: 5100

## 2021-06-20 LAB — TYPE AND SCREEN
ABO/RH(D): O POS
Antibody Screen: NEGATIVE
Unit division: 0

## 2021-06-20 LAB — PLATELET INHIBITION P2Y12

## 2021-06-20 MED ORDER — ALBUMIN HUMAN 25 % IV SOLN
12.5000 g | Freq: Once | INTRAVENOUS | Status: AC
  Administered 2021-06-20: 12.5 g via INTRAVENOUS
  Filled 2021-06-20: qty 50

## 2021-06-20 MED ORDER — QUETIAPINE FUMARATE 200 MG PO TABS
200.0000 mg | ORAL_TABLET | Freq: Three times a day (TID) | ORAL | Status: DC
Start: 2021-06-20 — End: 2021-06-23
  Administered 2021-06-20 – 2021-06-23 (×10): 200 mg
  Filled 2021-06-20 (×10): qty 1

## 2021-06-20 MED ORDER — METOPROLOL TARTRATE 5 MG/5ML IV SOLN
5.0000 mg | Freq: Once | INTRAVENOUS | Status: AC
  Administered 2021-06-20: 5 mg via INTRAVENOUS
  Filled 2021-06-20: qty 5

## 2021-06-20 MED ORDER — ENOXAPARIN SODIUM 30 MG/0.3ML IJ SOSY
30.0000 mg | PREFILLED_SYRINGE | INTRAMUSCULAR | Status: DC
Start: 2021-06-20 — End: 2021-06-23
  Administered 2021-06-20 – 2021-06-23 (×4): 30 mg via SUBCUTANEOUS
  Filled 2021-06-20 (×4): qty 0.3

## 2021-06-20 MED ORDER — METOPROLOL TARTRATE 25 MG/10 ML ORAL SUSPENSION
50.0000 mg | Freq: Two times a day (BID) | ORAL | Status: DC
  Administered 2021-06-20 – 2021-06-27 (×14): 50 mg
  Filled 2021-06-20 (×19): qty 20

## 2021-06-20 MED ORDER — CLONAZEPAM 1 MG PO TABS
2.0000 mg | ORAL_TABLET | Freq: Two times a day (BID) | ORAL | Status: DC
Start: 2021-06-20 — End: 2021-06-23
  Administered 2021-06-20 – 2021-06-23 (×6): 2 mg
  Filled 2021-06-20 (×6): qty 2
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
## 2021-06-20 NOTE — Progress Notes (Signed)
Notified MD Janee Morn of pt's HR sustained 120s-low 130s. One time dose of metoprolol given. If HR continues to stay high, will increase sedation, per MD. All other vitals at this time within normal limits. Family at bedside updated on plan.

## 2021-06-20 NOTE — Progress Notes (Signed)
Paged Kinsinger MD regarding HR 130's and tachypnic. PRN lopressor already administered. Tylenol given and ice applied for fever. 1 unit PRBC currently infusing for Hgb 6.6. Per tube oxy given for pain management and fentanyl bolus. Patient repositioned and suctioned. Patient appears comfortable and adequately sedated. Parents at bedside. They have expressed their concern and appear anxious. MD states to continue to monitor, let blood finish, and notify if HR reaches 140's.

## 2021-06-21 ENCOUNTER — Inpatient Hospital Stay (HOSPITAL_COMMUNITY): Payer: BC Managed Care – PPO

## 2021-06-21 LAB — CBC
HCT: 26.2 % — ABNORMAL LOW (ref 39.0–52.0)
Hemoglobin: 7.9 g/dL — ABNORMAL LOW (ref 13.0–17.0)
MCH: 29.6 pg (ref 26.0–34.0)
MCHC: 30.2 g/dL (ref 30.0–36.0)
MCV: 98.1 fL (ref 80.0–100.0)
Platelets: 350 10*3/uL (ref 150–400)
RBC: 2.67 MIL/uL — ABNORMAL LOW (ref 4.22–5.81)
RDW: 15 % (ref 11.5–15.5)
WBC: 13.2 10*3/uL — ABNORMAL HIGH (ref 4.0–10.5)
nRBC: 0 % (ref 0.0–0.2)

## 2021-06-21 LAB — BASIC METABOLIC PANEL
Anion gap: 9 (ref 5–15)
BUN: 68 mg/dL — ABNORMAL HIGH (ref 6–20)
CO2: 19 mmol/L — ABNORMAL LOW (ref 22–32)
Calcium: 8.5 mg/dL — ABNORMAL LOW (ref 8.9–10.3)
Chloride: 124 mmol/L — ABNORMAL HIGH (ref 98–111)
Creatinine, Ser: 2.09 mg/dL — ABNORMAL HIGH (ref 0.61–1.24)
GFR, Estimated: 45 mL/min — ABNORMAL LOW (ref >60)
Glucose, Bld: 101 mg/dL — ABNORMAL HIGH (ref 70–99)
Potassium: 4.4 mmol/L (ref 3.5–5.1)
Sodium: 152 mmol/L — ABNORMAL HIGH (ref 135–145)

## 2021-06-21 LAB — GLUCOSE, CAPILLARY
Glucose-Capillary: 106 mg/dL — ABNORMAL HIGH (ref 70–99)
Glucose-Capillary: 107 mg/dL — ABNORMAL HIGH (ref 70–99)
Glucose-Capillary: 121 mg/dL — ABNORMAL HIGH (ref 70–99)
Glucose-Capillary: 90 mg/dL (ref 70–99)
Glucose-Capillary: 97 mg/dL (ref 70–99)
Glucose-Capillary: 99 mg/dL (ref 70–99)

## 2021-06-21 LAB — POCT I-STAT 7, (LYTES, BLD GAS, ICA,H+H)
Acid-base deficit: 5 mmol/L — ABNORMAL HIGH (ref 0.0–2.0)
Bicarbonate: 21 mmol/L (ref 20.0–28.0)
Calcium, Ion: 1.34 mmol/L (ref 1.15–1.40)
HCT: 21 % — ABNORMAL LOW (ref 39.0–52.0)
Hemoglobin: 7.1 g/dL — ABNORMAL LOW (ref 13.0–17.0)
O2 Saturation: 96 %
Patient temperature: 36.8
Potassium: 4.8 mmol/L (ref 3.5–5.1)
Sodium: 158 mmol/L — ABNORMAL HIGH (ref 135–145)
TCO2: 22 mmol/L (ref 22–32)
pCO2 arterial: 39.3 mmHg (ref 32.0–48.0)
pH, Arterial: 7.335 — ABNORMAL LOW (ref 7.350–7.450)
pO2, Arterial: 87 mmHg (ref 83.0–108.0)

## 2021-06-21 MED ORDER — FENTANYL CITRATE PF 50 MCG/ML IJ SOSY
50.0000 ug | PREFILLED_SYRINGE | INTRAMUSCULAR | Status: DC | PRN
Start: 2021-06-21 — End: 2021-06-23
  Administered 2021-06-21: 100 ug via INTRAVENOUS
  Administered 2021-06-22: 50 ug via INTRAVENOUS

## 2021-06-21 MED ORDER — FREE WATER
200.0000 mL | Freq: Three times a day (TID) | Status: DC
Start: 2021-06-21 — End: 2021-06-24
  Administered 2021-06-21 – 2021-06-24 (×9): 200 mL

## 2021-06-21 MED ORDER — MIDAZOLAM HCL 2 MG/2ML IJ SOLN
2.0000 mg | INTRAMUSCULAR | Status: DC | PRN
  Administered 2021-06-22 – 2021-06-23 (×2): 2 mg via INTRAVENOUS
  Filled 2021-06-21: qty 2

## 2021-06-21 MED ORDER — GUAIFENESIN 100 MG/5ML PO LIQD
20.0000 mL | ORAL | Status: DC | PRN
  Administered 2021-06-21 – 2021-06-23 (×5): 20 mL
  Filled 2021-06-21 (×5): qty 30

## 2021-06-21 MED ORDER — GUAIFENESIN ER 600 MG PO TB12: 1200.0000 mg | ORAL_TABLET | Freq: Two times a day (BID) | ORAL | Status: DC | PRN

## 2021-06-21 MED ORDER — ARTIFICIAL TEARS OPHTHALMIC OINT
TOPICAL_OINTMENT | OPHTHALMIC | Status: DC | PRN
  Administered 2021-06-23: 1 via OPHTHALMIC
  Filled 2021-06-21: qty 3.5
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
## 2021-06-21 NOTE — Progress Notes (Signed)
RT called to pts room to assess pts increased WOB and low sats. Pt was placed on 100% FIO2 and suctioned for moderate amount of pink ting secretions. Sats increased to 97%. VT was decreased to 6cc's due to high PIP's. Pt is stable at this time.

## 2021-06-21 NOTE — Progress Notes (Addendum)
0730 Pt with increased work of breathing, agitated, tachypneic. Pt suctioned and lavaged by RT.   0750 Pt still with high peak pressures sedation again increased. RT at bedside adjusting vent. Will page MD for orders. WCTM.   0800 New orders received for medication boluses for sedation. WCTM.   0830 Pt finally sedated, tolerating vent, VSS, WCTM.   1000 DR. Thompson at bedside, assessing pt, new orders placed, WCTM.  1300 Informed Dr. Janee Morn about high urine output. No new orders, WCTM.

## 2021-06-22 DIAGNOSIS — L8990 Pressure ulcer of unspecified site, unspecified stage: Secondary | ICD-10-CM | POA: Insufficient documentation

## 2021-06-22 LAB — CBC
HCT: 27.4 % — ABNORMAL LOW (ref 39.0–52.0)
Hemoglobin: 8.4 g/dL — ABNORMAL LOW (ref 13.0–17.0)
MCH: 29.8 pg (ref 26.0–34.0)
MCHC: 30.7 g/dL (ref 30.0–36.0)
MCV: 97.2 fL (ref 80.0–100.0)
Platelets: 371 10*3/uL (ref 150–400)
RBC: 2.82 MIL/uL — ABNORMAL LOW (ref 4.22–5.81)
RDW: 14.8 % (ref 11.5–15.5)
WBC: 13.4 10*3/uL — ABNORMAL HIGH (ref 4.0–10.5)
nRBC: 0 % (ref 0.0–0.2)

## 2021-06-22 LAB — TRIGLYCERIDES: Triglycerides: 182 mg/dL — ABNORMAL HIGH (ref <150)

## 2021-06-22 LAB — BASIC METABOLIC PANEL
Anion gap: 6 (ref 5–15)
BUN: 52 mg/dL — ABNORMAL HIGH (ref 6–20)
CO2: 23 mmol/L (ref 22–32)
Calcium: 8.9 mg/dL (ref 8.9–10.3)
Chloride: 124 mmol/L — ABNORMAL HIGH (ref 98–111)
Creatinine, Ser: 1.5 mg/dL — ABNORMAL HIGH (ref 0.61–1.24)
GFR, Estimated: >60 mL/min (ref >60)
Glucose, Bld: 125 mg/dL — ABNORMAL HIGH (ref 70–99)
Potassium: 4.5 mmol/L (ref 3.5–5.1)
Sodium: 153 mmol/L — ABNORMAL HIGH (ref 135–145)

## 2021-06-22 LAB — GLUCOSE, CAPILLARY
Glucose-Capillary: 105 mg/dL — ABNORMAL HIGH (ref 70–99)
Glucose-Capillary: 106 mg/dL — ABNORMAL HIGH (ref 70–99)
Glucose-Capillary: 108 mg/dL — ABNORMAL HIGH (ref 70–99)
Glucose-Capillary: 114 mg/dL — ABNORMAL HIGH (ref 70–99)
Glucose-Capillary: 114 mg/dL — ABNORMAL HIGH (ref 70–99)
Glucose-Capillary: 97 mg/dL (ref 70–99)

## 2021-06-22 MED ORDER — BETHANECHOL CHLORIDE 25 MG PO TABS
25.0000 mg | ORAL_TABLET | Freq: Three times a day (TID) | ORAL | Status: DC
  Administered 2021-06-22 – 2021-06-27 (×15): 25 mg
  Filled 2021-06-22 (×17): qty 1
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
## 2021-06-22 NOTE — Progress Notes (Signed)
Came to room to check pt and give CPT.  Pt's mom is concerned about having CPT done d/t recent procedures.  Per discussion w/ Trauma MD, it's okay to hold CPT until they can clarify w/ neuro MD's.

## 2021-06-22 NOTE — Progress Notes (Signed)
Patient ID: Jason [name], male   DOB: 2000-08-14, 20 y.o.   MRN: [id number] Weaning well on 5/5. I updated his parents at the bedside.  Violeta Gelinas, MD, MPH, FACS Please use [email or web address] to contact on call provider

## 2021-06-22 NOTE — Plan of Care (Signed)
  Problem: Safety: Goal: Non-violent Restraint(s) Outcome: Progressing   Problem: Education: Goal: Knowledge of General Education information will improve Description: Including pain rating scale, medication(s)/side effects and non-pharmacologic comfort measures Outcome: Progressing   Problem: Health Behavior/Discharge Planning: Goal: Ability to manage health-related needs will improve Outcome: Progressing   Problem: Clinical Measurements: Goal: Ability to maintain clinical measurements within normal limits will improve Outcome: Progressing Goal: Will remain free from infection Outcome: Progressing Goal: Diagnostic test results will improve Outcome: Progressing Goal: Respiratory complications will improve Outcome: Progressing Goal: Cardiovascular complication will be avoided Outcome: Progressing   Problem: Activity: Goal: Risk for activity intolerance will decrease Outcome: Progressing   Problem: Nutrition: Goal: Adequate nutrition will be maintained Outcome: Progressing   Problem: Coping: Goal: Level of anxiety will decrease Outcome: Progressing   

## 2021-06-22 NOTE — Progress Notes (Signed)
Per RN discussion, Dr Janeece Agee MD clarified order for CPT via bed and per MD, okay to continue bed CPT.

## 2021-06-23 ENCOUNTER — Inpatient Hospital Stay (HOSPITAL_COMMUNITY): Payer: BC Managed Care – PPO

## 2021-06-23 LAB — BASIC METABOLIC PANEL
Anion gap: 8 (ref 5–15)
BUN: 38 mg/dL — ABNORMAL HIGH (ref 6–20)
CO2: 23 mmol/L (ref 22–32)
Calcium: 8.6 mg/dL — ABNORMAL LOW (ref 8.9–10.3)
Chloride: 114 mmol/L — ABNORMAL HIGH (ref 98–111)
Creatinine, Ser: 1.06 mg/dL (ref 0.61–1.24)
GFR, Estimated: >60 mL/min (ref >60)
Glucose, Bld: 109 mg/dL — ABNORMAL HIGH (ref 70–99)
Potassium: 4.3 mmol/L (ref 3.5–5.1)
Sodium: 145 mmol/L (ref 135–145)

## 2021-06-23 LAB — GLUCOSE, CAPILLARY
Glucose-Capillary: 116 mg/dL — ABNORMAL HIGH (ref 70–99)
Glucose-Capillary: 118 mg/dL — ABNORMAL HIGH (ref 70–99)
Glucose-Capillary: 118 mg/dL — ABNORMAL HIGH (ref 70–99)
Glucose-Capillary: 86 mg/dL (ref 70–99)
Glucose-Capillary: 94 mg/dL (ref 70–99)
Glucose-Capillary: 94 mg/dL (ref 70–99)

## 2021-06-23 LAB — CBC
HCT: 29.2 % — ABNORMAL LOW (ref 39.0–52.0)
Hemoglobin: 9 g/dL — ABNORMAL LOW (ref 13.0–17.0)
MCH: 29.8 pg (ref 26.0–34.0)
MCHC: 30.8 g/dL (ref 30.0–36.0)
MCV: 96.7 fL (ref 80.0–100.0)
Platelets: 433 10*3/uL — ABNORMAL HIGH (ref 150–400)
RBC: 3.02 MIL/uL — ABNORMAL LOW (ref 4.22–5.81)
RDW: 14.6 % (ref 11.5–15.5)
WBC: 11.4 10*3/uL — ABNORMAL HIGH (ref 4.0–10.5)
nRBC: 0 % (ref 0.0–0.2)

## 2021-06-23 MED ORDER — ENOXAPARIN SODIUM 40 MG/0.4ML IJ SOSY
40.0000 mg | PREFILLED_SYRINGE | INTRAMUSCULAR | Status: DC
  Administered 2021-06-24 – 2021-06-27 (×4): 40 mg via SUBCUTANEOUS
  Filled 2021-06-23 (×4): qty 0.4

## 2021-06-23 MED ORDER — QUETIAPINE FUMARATE 25 MG PO TABS
50.0000 mg | ORAL_TABLET | Freq: Three times a day (TID) | ORAL | Status: DC
  Administered 2021-06-23 – 2021-06-25 (×7): 50 mg
  Filled 2021-06-23 (×9): qty 2

## 2021-06-23 MED ORDER — CLONAZEPAM 0.5 MG PO TABS
0.5000 mg | ORAL_TABLET | Freq: Two times a day (BID) | ORAL | Status: DC
Start: 2021-06-23 — End: 2021-06-26
  Administered 2021-06-23 – 2021-06-25 (×4): 0.5 mg
  Filled 2021-06-23 (×6): qty 1

## 2021-06-23 MED ORDER — FENTANYL CITRATE PF 50 MCG/ML IJ SOSY
50.0000 ug | PREFILLED_SYRINGE | INTRAMUSCULAR | Status: DC | PRN
  Administered 2021-06-24: 50 ug via INTRAVENOUS
  Administered 2021-06-24: 25 ug via INTRAVENOUS
  Filled 2021-06-23 (×2): qty 1

## 2021-06-23 NOTE — Procedures (Signed)
Extubation Procedure Note  Patient Details:   Name: Jason [name] DOB: 2001-04-23 MRN: [id number]   Airway Documentation:    Vent end date: 06/23/21 Vent end time: 1000   Evaluation  O2 sats: stable throughout Complications: No apparent complications Patient did tolerate procedure well. Bilateral Breath Sounds: Rhonchi   No  Pt extubated to 4l [place] with RN & Dr. Janee Morn at bedside. Positive cuff leak noted. Pt is tolerating well but is not speaking. RT will continue to monitor.  Forest Becker 06/23/2021, 10:05 AM
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
## 2021-06-23 NOTE — Progress Notes (Signed)
75 mL of fentanyl wasted with Londell Moh RN

## 2021-06-23 NOTE — Progress Notes (Signed)
Pt placed on PS/CPAP 8/5 @ 40% and is tolerating well. RT will continue to monitor.

## 2021-06-24 LAB — CBC
HCT: 31.1 % — ABNORMAL LOW (ref 39.0–52.0)
Hemoglobin: 9.7 g/dL — ABNORMAL LOW (ref 13.0–17.0)
MCH: 29.1 pg (ref 26.0–34.0)
MCHC: 31.2 g/dL (ref 30.0–36.0)
MCV: 93.4 fL (ref 80.0–100.0)
Platelets: 468 10*3/uL — ABNORMAL HIGH (ref 150–400)
RBC: 3.33 MIL/uL — ABNORMAL LOW (ref 4.22–5.81)
RDW: 13.6 % (ref 11.5–15.5)
WBC: 15.2 10*3/uL — ABNORMAL HIGH (ref 4.0–10.5)
nRBC: 0 % (ref 0.0–0.2)

## 2021-06-24 LAB — BASIC METABOLIC PANEL
Anion gap: 7 (ref 5–15)
BUN: 36 mg/dL — ABNORMAL HIGH (ref 6–20)
CO2: 23 mmol/L (ref 22–32)
Calcium: 8.9 mg/dL (ref 8.9–10.3)
Chloride: 116 mmol/L — ABNORMAL HIGH (ref 98–111)
Creatinine, Ser: 1 mg/dL (ref 0.61–1.24)
GFR, Estimated: >60 mL/min (ref >60)
Glucose, Bld: 125 mg/dL — ABNORMAL HIGH (ref 70–99)
Potassium: 3.8 mmol/L (ref 3.5–5.1)
Sodium: 146 mmol/L — ABNORMAL HIGH (ref 135–145)

## 2021-06-24 LAB — GLUCOSE, CAPILLARY
Glucose-Capillary: 103 mg/dL — ABNORMAL HIGH (ref 70–99)
Glucose-Capillary: 123 mg/dL — ABNORMAL HIGH (ref 70–99)
Glucose-Capillary: 130 mg/dL — ABNORMAL HIGH (ref 70–99)
Glucose-Capillary: 92 mg/dL (ref 70–99)

## 2021-06-24 LAB — TRIGLYCERIDES: Triglycerides: 236 mg/dL — ABNORMAL HIGH (ref <150)

## 2021-06-24 MED ORDER — METOCLOPRAMIDE HCL 5 MG/ML IJ SOLN
5.0000 mg | Freq: Four times a day (QID) | INTRAMUSCULAR | Status: DC
  Administered 2021-06-24 – 2021-06-27 (×13): 5 mg via INTRAVENOUS
  Filled 2021-06-24 (×13): qty 2

## 2021-06-24 MED ORDER — ORAL CARE MOUTH RINSE
15.0000 mL | Freq: Two times a day (BID) | OROMUCOSAL | Status: DC
  Administered 2021-06-24 – 2021-06-27 (×6): 15 mL via OROMUCOSAL

## 2021-06-24 MED ORDER — LORAZEPAM 2 MG/ML IJ SOLN
1.0000 mg | INTRAMUSCULAR | Status: DC | PRN
  Administered 2021-06-24 – 2021-06-26 (×2): 1 mg via INTRAVENOUS
  Filled 2021-06-24 (×3): qty 1
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
## 2021-06-24 NOTE — Plan of Care (Signed)
  Problem: Safety: Goal: Non-violent Restraint(s) Outcome: Progressing   Problem: Education: Goal: Knowledge of General Education information will improve Description: Including pain rating scale, medication(s)/side effects and non-pharmacologic comfort measures Outcome: Progressing   Problem: Health Behavior/Discharge Planning: Goal: Ability to manage health-related needs will improve Outcome: Progressing   Problem: Clinical Measurements: Goal: Ability to maintain clinical measurements within normal limits will improve Outcome: Progressing Goal: Will remain free from infection Outcome: Progressing Goal: Diagnostic test results will improve Outcome: Progressing Goal: Respiratory complications will improve Outcome: Progressing Goal: Cardiovascular complication will be avoided Outcome: Progressing   Problem: Activity: Goal: Risk for activity intolerance will decrease Outcome: Progressing   Problem: Nutrition: Goal: Adequate nutrition will be maintained Outcome: Progressing   Problem: Coping: Goal: Level of anxiety will decrease Outcome: Progressing   

## 2021-06-24 NOTE — Progress Notes (Signed)
Pt vomited x 3, PRN zofran given per order. Tube feeds stopped.  Dr. Janee Morn at bedside. Order to hold tube feeds today.
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
## 2021-06-24 NOTE — Progress Notes (Signed)
SLP Cancellation Note  Patient Details Name: Irie Fiorello MRN: [id number] DOB: 07/16/2000   Cancelled treatment:       Reason Eval/Treat Not Completed: Other (comment). Pt vomiting.Will f/u tomorrow.    Sephira Zellman, [name] 06/24/2021, 10:48 AM
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
## 2021-06-24 NOTE — Progress Notes (Signed)
? ?  Inpatient Rehab Admissions Coordinator : ? ?Per therapy recommendations, patient was screened for CIR candidacy by Larena Ohnemus RN MSN.  At this time patient appears to be a potential candidate for CIR. I will place a rehab consult per protocol for full assessment. Please call me with any questions. ? ?Lashunta Frieden RN MSN ?Admissions Coordinator ?[phone] ?  ?

## 2021-06-24 NOTE — Progress Notes (Signed)
Vomited after per tube protonix adminstration, held all other medications due at 1000. Dr. Janee Morn notified and ordered Reglan Q6.

## 2021-06-25 ENCOUNTER — Inpatient Hospital Stay (HOSPITAL_COMMUNITY): Payer: BC Managed Care – PPO

## 2021-06-25 LAB — GLUCOSE, CAPILLARY
Glucose-Capillary: 102 mg/dL — ABNORMAL HIGH (ref 70–99)
Glucose-Capillary: 104 mg/dL — ABNORMAL HIGH (ref 70–99)
Glucose-Capillary: 105 mg/dL — ABNORMAL HIGH (ref 70–99)
Glucose-Capillary: 105 mg/dL — ABNORMAL HIGH (ref 70–99)
Glucose-Capillary: 108 mg/dL — ABNORMAL HIGH (ref 70–99)
Glucose-Capillary: 96 mg/dL (ref 70–99)

## 2021-06-25 LAB — CBC
HCT: 33.5 % — ABNORMAL LOW (ref 39.0–52.0)
Hemoglobin: 10.2 g/dL — ABNORMAL LOW (ref 13.0–17.0)
MCH: 28.7 pg (ref 26.0–34.0)
MCHC: 30.4 g/dL (ref 30.0–36.0)
MCV: 94.4 fL (ref 80.0–100.0)
Platelets: 539 10*3/uL — ABNORMAL HIGH (ref 150–400)
RBC: 3.55 MIL/uL — ABNORMAL LOW (ref 4.22–5.81)
RDW: 13.6 % (ref 11.5–15.5)
WBC: 15.8 10*3/uL — ABNORMAL HIGH (ref 4.0–10.5)
nRBC: 0 % (ref 0.0–0.2)

## 2021-06-25 LAB — BASIC METABOLIC PANEL
Anion gap: 8 (ref 5–15)
BUN: 32 mg/dL — ABNORMAL HIGH (ref 6–20)
CO2: 22 mmol/L (ref 22–32)
Calcium: 8.6 mg/dL — ABNORMAL LOW (ref 8.9–10.3)
Chloride: 115 mmol/L — ABNORMAL HIGH (ref 98–111)
Creatinine, Ser: 0.82 mg/dL (ref 0.61–1.24)
GFR, Estimated: >60 mL/min (ref >60)
Glucose, Bld: 121 mg/dL — ABNORMAL HIGH (ref 70–99)
Potassium: 3.4 mmol/L — ABNORMAL LOW (ref 3.5–5.1)
Sodium: 145 mmol/L (ref 135–145)

## 2021-06-25 MED ORDER — POTASSIUM CHLORIDE 20 MEQ PO PACK
20.0000 meq | PACK | Freq: Once | ORAL | Status: AC
  Administered 2021-06-25: 20 meq
  Filled 2021-06-25: qty 1

## 2021-06-25 MED ORDER — IOHEXOL 350 MG/ML SOLN
75.0000 mL | Freq: Once | INTRAVENOUS | Status: AC | PRN
  Administered 2021-06-25: 75 mL via INTRAVENOUS

## 2021-06-25 MED ORDER — PIVOT 1.5 CAL PO LIQD
1000.0000 mL | ORAL | Status: DC
Start: 2021-06-25 — End: 2021-06-26
  Administered 2021-06-25: 1000 mL
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
## 2021-06-25 NOTE — Progress Notes (Signed)
Inpatient Rehab Admissions Coordinator:   Left message for pt's mother, Joni Reining, to discuss CIR recommendations.  Await her callback.    Estill Dooms, PT, DPT Admissions Coordinator [phone] 06/25/21  10:48 AM
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
## 2021-06-25 NOTE — Progress Notes (Signed)
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 Frederik Pear.   Patient is showing improvement, neurosurgery and trauma are following.  Pt will be discharged to CIR when it is appropriate per trauma.   Recommendation from NIR:  CTA head and neck in 2 week - order placed under discharge.   NIR will sign off but will be available for further assistant at any time. Please call NIR for questions and concerns.  Lynann Bologna Julieanna Geraci PA-C 06/25/2021 4:06 PM
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
## 2021-06-25 NOTE — Progress Notes (Signed)
He is extubated and out of bed to a chair working with the speech therapist.  He opens his left eye and regards [place].  He follows commands.  He seems to be moving all 4 extremities with some generalized discoordination and mild weakness.  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  He has ptosis on the right.  The right pupil is dilated but he can count fingers on that side.  Overall I am pleased with his progress.  I spoke with his mother and father at length.  I spent about 15 minutes in the room today.  I will continue to follow

## 2021-06-26 ENCOUNTER — Inpatient Hospital Stay (HOSPITAL_COMMUNITY): Payer: BC Managed Care – PPO

## 2021-06-26 LAB — BASIC METABOLIC PANEL
Anion gap: 9 (ref 5–15)
BUN: 35 mg/dL — ABNORMAL HIGH (ref 6–20)
CO2: 20 mmol/L — ABNORMAL LOW (ref 22–32)
Calcium: 9.1 mg/dL (ref 8.9–10.3)
Chloride: 118 mmol/L — ABNORMAL HIGH (ref 98–111)
Creatinine, Ser: 0.74 mg/dL (ref 0.61–1.24)
GFR, Estimated: >60 mL/min (ref >60)
Glucose, Bld: 119 mg/dL — ABNORMAL HIGH (ref 70–99)
Potassium: 3.6 mmol/L (ref 3.5–5.1)
Sodium: 147 mmol/L — ABNORMAL HIGH (ref 135–145)

## 2021-06-26 LAB — GLUCOSE, CAPILLARY
Glucose-Capillary: 91 mg/dL (ref 70–99)
Glucose-Capillary: 111 mg/dL — ABNORMAL HIGH (ref 70–99)
Glucose-Capillary: 96 mg/dL (ref 70–99)
Glucose-Capillary: 109 mg/dL — ABNORMAL HIGH (ref 70–99)
Glucose-Capillary: 106 mg/dL — ABNORMAL HIGH (ref 70–99)

## 2021-06-26 LAB — CBC
HCT: 34.7 % — ABNORMAL LOW (ref 39.0–52.0)
Hemoglobin: 11.1 g/dL — ABNORMAL LOW (ref 13.0–17.0)
MCH: 29.2 pg (ref 26.0–34.0)
MCHC: 32 g/dL (ref 30.0–36.0)
MCV: 91.3 fL (ref 80.0–100.0)
Platelets: 561 10*3/uL — ABNORMAL HIGH (ref 150–400)
RBC: 3.8 MIL/uL — ABNORMAL LOW (ref 4.22–5.81)
RDW: 13.6 % (ref 11.5–15.5)
WBC: 14.5 10*3/uL — ABNORMAL HIGH (ref 4.0–10.5)
nRBC: 0 % (ref 0.0–0.2)

## 2021-06-26 LAB — TRIGLYCERIDES: Triglycerides: 216 mg/dL — ABNORMAL HIGH (ref <150)

## 2021-06-26 MED ORDER — QUETIAPINE FUMARATE 50 MG PO TABS
25.0000 mg | ORAL_TABLET | Freq: Every day | ORAL | Status: DC
  Administered 2021-06-26: 25 mg
  Filled 2021-06-26: qty 1

## 2021-06-26 MED ORDER — PIVOT 1.5 CAL PO LIQD
1000.0000 mL | ORAL | Status: DC
  Administered 2021-06-26 (×2): 1000 mL
  Filled 2021-06-26 (×3): qty 1000

## 2021-06-26 MED ORDER — CLONAZEPAM 0.25 MG PO TBDP
0.2500 mg | ORAL_TABLET | Freq: Two times a day (BID) | ORAL | Status: DC
  Administered 2021-06-26 – 2021-06-27 (×3): 0.25 mg
  Filled 2021-06-26 (×3): qty 1

## 2021-06-26 MED ORDER — FREE WATER
100.0000 mL | Freq: Four times a day (QID) | Status: DC
  Administered 2021-06-26 – 2021-06-27 (×5): 100 mL
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
## 2021-06-26 NOTE — Progress Notes (Signed)
Inpatient Rehab Admissions Coordinator:   Still no word from [facility] on prior auth request.  Left message for pt's mom to update.    Estill Dooms, PT, DPT Admissions Coordinator [phone] 06/26/21  3:38 PM

## 2021-06-26 NOTE — Evaluation (Signed)
Modified Barium Swallow Progress Note  Patient Details  Name: Jason [name] MRN: [id number] Date of Birth: 2001/04/12  Today's Date: 06/26/2021  Modified Barium Swallow completed.  Full report located under Chart Review in the Imaging Section.  Brief recommendations include the following:  Clinical Impression  Pt demonstrates significant swallowing impairment including primary oral deficits complicated by decreased pharyngeal and laryngeal sensation impacting timing and airway protection. Pt has overt right CNVII impairment leading to decreased labial seal and intraoral pressure. Also appearing to have CV V impairment; pt does move mandible, but pt does not sustain closure. SLP provided tactile assist on 80% of swallows on this test to achieve improved mandibualr elevation and labial closure. Lingual propusion is effortful and prolonged with multipled weak thrusts needed to gather and propel bolus. This leads to gradual slow spillage of bolus to the pharynx, with pooling in the pyriforms with nectar and in the valleculae with honey. Pt relatively good pharyngeal strength (laryngeal elevation, hyoid excursion epiglottic deflection) but pooled nectar thick boluses in the pyriforms are aspirated before the swallow and thick honey and puree boluses cling in the upper pharynx for inefficient swallowing due to decreased base of tongue propulsion. a head tilt right with nectar teaspoon is the best therapeutic posture, but is not appropriate for unlimited intake as silent aspiration events do occur.   Overall, pt is very fatigued today and his lingual surface is very dry. He is aspirating without ability to eject aspirate from his airway. Recommend pt initiate ice chips allowed with family at bedside after training from SLP to hydrate lingual surface and provide further swallowing opportunities with otherwise ongoing NPO status and repeat MBS in the next week.   Swallow Evaluation Recommendations        SLP Diet Recommendations: NPO;Alternative means - temporary;Ice chips PRN after oral care       Medication Administration: Via alternative means               Oral Care Recommendations: Oral care QID        Kristen Bushway, [name] 06/26/2021,3:18 PM
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
## 2021-06-27 ENCOUNTER — Encounter (HOSPITAL_COMMUNITY): Payer: Self-pay

## 2021-06-27 ENCOUNTER — Inpatient Hospital Stay (HOSPITAL_COMMUNITY)
Admission: RE | Admit: 2021-06-27 | Discharge: 2021-07-16 | DRG: 949 | Disposition: A | Discharge status: Home / Self Care | Payer: BC Managed Care – PPO | Source: Intra-hospital | Attending: Physical Medicine & Rehabilitation | Admitting: Physical Medicine & Rehabilitation

## 2021-06-27 ENCOUNTER — Encounter (HOSPITAL_COMMUNITY): Payer: Self-pay | Admitting: General Surgery

## 2021-06-27 ENCOUNTER — Encounter (HOSPITAL_COMMUNITY): Payer: Self-pay | Admitting: Physical Medicine & Rehabilitation

## 2021-06-27 ENCOUNTER — Other Ambulatory Visit: Payer: Self-pay

## 2021-06-27 DIAGNOSIS — N179 Acute kidney failure, unspecified: Secondary | ICD-10-CM | POA: Diagnosis present

## 2021-06-27 DIAGNOSIS — S36039D Unspecified laceration of spleen, subsequent encounter: Secondary | ICD-10-CM | POA: Diagnosis not present

## 2021-06-27 DIAGNOSIS — S37032D Laceration of left kidney, unspecified degree, subsequent encounter: Secondary | ICD-10-CM

## 2021-06-27 DIAGNOSIS — R339 Retention of urine, unspecified: Secondary | ICD-10-CM | POA: Diagnosis present

## 2021-06-27 DIAGNOSIS — D72829 Elevated white blood cell count, unspecified: Secondary | ICD-10-CM | POA: Diagnosis present

## 2021-06-27 DIAGNOSIS — R4189 Other symptoms and signs involving cognitive functions and awareness: Secondary | ICD-10-CM | POA: Diagnosis present

## 2021-06-27 DIAGNOSIS — I7771 Dissection of carotid artery: Secondary | ICD-10-CM | POA: Diagnosis present

## 2021-06-27 DIAGNOSIS — Y92410 Unspecified street and highway as the place of occurrence of the external cause: Secondary | ICD-10-CM

## 2021-06-27 DIAGNOSIS — S15092D Other specified injury of left carotid artery, subsequent encounter: Secondary | ICD-10-CM | POA: Diagnosis not present

## 2021-06-27 DIAGNOSIS — L89812 Pressure ulcer of head, stage 2: Secondary | ICD-10-CM | POA: Diagnosis present

## 2021-06-27 DIAGNOSIS — R32 Unspecified urinary incontinence: Secondary | ICD-10-CM | POA: Diagnosis present

## 2021-06-27 DIAGNOSIS — S02109A Fracture of base of skull, unspecified side, initial encounter for closed fracture: Secondary | ICD-10-CM | POA: Diagnosis present

## 2021-06-27 DIAGNOSIS — E870 Hyperosmolality and hypernatremia: Secondary | ICD-10-CM | POA: Diagnosis present

## 2021-06-27 DIAGNOSIS — S069X6D Unspecified intracranial injury with loss of consciousness greater than 24 hours without return to pre-existing conscious level with patient surviving, subsequent encounter: Secondary | ICD-10-CM

## 2021-06-27 DIAGNOSIS — D62 Acute posthemorrhagic anemia: Secondary | ICD-10-CM | POA: Diagnosis present

## 2021-06-27 DIAGNOSIS — S0219XD Other fracture of base of skull, subsequent encounter for fracture with routine healing: Secondary | ICD-10-CM | POA: Diagnosis not present

## 2021-06-27 DIAGNOSIS — S069X4S Unspecified intracranial injury with loss of consciousness of 6 hours to 24 hours, sequela: Secondary | ICD-10-CM | POA: Diagnosis not present

## 2021-06-27 DIAGNOSIS — F802 Mixed receptive-expressive language disorder: Secondary | ICD-10-CM | POA: Diagnosis present

## 2021-06-27 DIAGNOSIS — H4901 Third [oculomotor] nerve palsy, right eye: Secondary | ICD-10-CM | POA: Diagnosis present

## 2021-06-27 DIAGNOSIS — S069X0S Unspecified intracranial injury without loss of consciousness, sequela: Secondary | ICD-10-CM | POA: Diagnosis not present

## 2021-06-27 DIAGNOSIS — R7989 Other specified abnormal findings of blood chemistry: Secondary | ICD-10-CM

## 2021-06-27 DIAGNOSIS — G479 Sleep disorder, unspecified: Secondary | ICD-10-CM

## 2021-06-27 DIAGNOSIS — Z833 Family history of diabetes mellitus: Secondary | ICD-10-CM | POA: Diagnosis not present

## 2021-06-27 DIAGNOSIS — S15091D Other specified injury of right carotid artery, subsequent encounter: Secondary | ICD-10-CM | POA: Diagnosis not present

## 2021-06-27 DIAGNOSIS — S2242XD Multiple fractures of ribs, left side, subsequent encounter for fracture with routine healing: Secondary | ICD-10-CM

## 2021-06-27 DIAGNOSIS — S069X3S Unspecified intracranial injury with loss of consciousness of 1 hour to 5 hours 59 minutes, sequela: Secondary | ICD-10-CM | POA: Diagnosis not present

## 2021-06-27 DIAGNOSIS — S066XAD Traumatic subarachnoid hemorrhage with loss of consciousness status unknown, subsequent encounter: Principal | ICD-10-CM

## 2021-06-27 DIAGNOSIS — S069XAA Unspecified intracranial injury with loss of consciousness status unknown, initial encounter: Secondary | ICD-10-CM | POA: Diagnosis present

## 2021-06-27 DIAGNOSIS — S0240DD Maxillary fracture, left side, subsequent encounter for fracture with routine healing: Secondary | ICD-10-CM

## 2021-06-27 DIAGNOSIS — S36113D Laceration of liver, unspecified degree, subsequent encounter: Secondary | ICD-10-CM

## 2021-06-27 DIAGNOSIS — R1312 Dysphagia, oropharyngeal phase: Secondary | ICD-10-CM | POA: Diagnosis present

## 2021-06-27 DIAGNOSIS — I671 Cerebral aneurysm, nonruptured: Secondary | ICD-10-CM | POA: Diagnosis present

## 2021-06-27 DIAGNOSIS — R55 Syncope and collapse: Secondary | ICD-10-CM | POA: Diagnosis present

## 2021-06-27 DIAGNOSIS — R000 Tachycardia, unspecified: Secondary | ICD-10-CM | POA: Diagnosis not present

## 2021-06-27 DIAGNOSIS — S069X2S Unspecified intracranial injury with loss of consciousness of 31 minutes to 59 minutes, sequela: Secondary | ICD-10-CM | POA: Diagnosis not present

## 2021-06-27 LAB — CBC
HCT: 39.2 % (ref 39.0–52.0)
Hemoglobin: 12.2 g/dL — ABNORMAL LOW (ref 13.0–17.0)
MCH: 28.6 pg (ref 26.0–34.0)
MCHC: 31.1 g/dL (ref 30.0–36.0)
MCV: 91.8 fL (ref 80.0–100.0)
Platelets: 564 10*3/uL — ABNORMAL HIGH (ref 150–400)
RBC: 4.27 MIL/uL (ref 4.22–5.81)
RDW: 13.8 % (ref 11.5–15.5)
WBC: 15.1 10*3/uL — ABNORMAL HIGH (ref 4.0–10.5)
nRBC: 0 % (ref 0.0–0.2)

## 2021-06-27 LAB — BASIC METABOLIC PANEL
Anion gap: 11 (ref 5–15)
BUN: 42 mg/dL — ABNORMAL HIGH (ref 6–20)
CO2: 19 mmol/L — ABNORMAL LOW (ref 22–32)
Calcium: 9.5 mg/dL (ref 8.9–10.3)
Chloride: 117 mmol/L — ABNORMAL HIGH (ref 98–111)
Creatinine, Ser: 0.81 mg/dL (ref 0.61–1.24)
GFR, Estimated: >60 mL/min (ref >60)
Glucose, Bld: 115 mg/dL — ABNORMAL HIGH (ref 70–99)
Potassium: 4.1 mmol/L (ref 3.5–5.1)
Sodium: 147 mmol/L — ABNORMAL HIGH (ref 135–145)

## 2021-06-27 LAB — GLUCOSE, CAPILLARY
Glucose-Capillary: 102 mg/dL — ABNORMAL HIGH (ref 70–99)
Glucose-Capillary: 107 mg/dL — ABNORMAL HIGH (ref 70–99)
Glucose-Capillary: 113 mg/dL — ABNORMAL HIGH (ref 70–99)
Glucose-Capillary: 113 mg/dL — ABNORMAL HIGH (ref 70–99)
Glucose-Capillary: 114 mg/dL — ABNORMAL HIGH (ref 70–99)
Glucose-Capillary: 91 mg/dL (ref 70–99)

## 2021-06-27 MED ORDER — GUAIFENESIN 100 MG/5ML PO LIQD: 20.0000 mL | ORAL | Status: DC | PRN

## 2021-06-27 MED ORDER — AMLODIPINE BESYLATE 5 MG PO TABS
5.0000 mg | ORAL_TABLET | Freq: Every day | ORAL | Status: DC
  Administered 2021-06-28 – 2021-07-03 (×6): 5 mg
  Filled 2021-06-27 (×6): qty 1

## 2021-06-27 MED ORDER — SILODOSIN 8 MG PO CAPS
8.0000 mg | ORAL_CAPSULE | Freq: Every day | ORAL | Status: DC
  Administered 2021-06-28 – 2021-07-01 (×4): 8 mg
  Filled 2021-06-27 (×4): qty 1

## 2021-06-27 MED ORDER — QUETIAPINE FUMARATE 25 MG PO TABS
25.0000 mg | ORAL_TABLET | Freq: Every day | ORAL | Status: DC
  Administered 2021-06-27: 25 mg
  Filled 2021-06-27 (×2): qty 1

## 2021-06-27 MED ORDER — TRAZODONE HCL 50 MG PO TABS: 25.0000 mg | ORAL_TABLET | Freq: Every evening | ORAL | Status: DC | PRN

## 2021-06-27 MED ORDER — POLYETHYLENE GLYCOL 3350 17 G PO PACK: 17.0000 g | PACK | Freq: Every day | ORAL | Status: DC

## 2021-06-27 MED ORDER — TRAZODONE HCL 50 MG PO TABS: 50.0000 mg | ORAL_TABLET | Freq: Every day | ORAL | Status: DC

## 2021-06-27 MED ORDER — IPRATROPIUM-ALBUTEROL 0.5-2.5 (3) MG/3ML IN SOLN: 3.0000 mL | Freq: Four times a day (QID) | RESPIRATORY_TRACT | Status: DC | PRN

## 2021-06-27 MED ORDER — FLEET ENEMA 7-19 GM/118ML RE ENEM: 1.0000 | ENEMA | Freq: Once | RECTAL | Status: DC | PRN

## 2021-06-27 MED ORDER — LORAZEPAM 2 MG/ML IJ SOLN
1.0000 mg | Freq: Three times a day (TID) | INTRAMUSCULAR | Status: DC | PRN
  Administered 2021-06-29: 07:00:00 1 mg via INTRAVENOUS
  Filled 2021-06-27 (×2): qty 1

## 2021-06-27 MED ORDER — ASPIRIN 81 MG PO CHEW
81.0000 mg | CHEWABLE_TABLET | Freq: Every day | ORAL | Status: DC
  Administered 2021-06-30 – 2021-07-01 (×2): 81 mg
  Filled 2021-06-27: qty 1

## 2021-06-27 MED ORDER — METOPROLOL TARTRATE 25 MG/10 ML ORAL SUSPENSION
50.0000 mg | Freq: Two times a day (BID) | ORAL | Status: DC
  Administered 2021-06-27 – 2021-07-03 (×12): 50 mg
  Filled 2021-06-27 (×14): qty 20

## 2021-06-27 MED ORDER — NON FORMULARY: 8.0000 mg | Freq: Every day | Status: DC

## 2021-06-27 MED ORDER — SILODOSIN 8 MG PO CAPS
8.0000 mg | ORAL_CAPSULE | Freq: Every day | ORAL | Status: DC
  Filled 2021-06-27: qty 1

## 2021-06-27 MED ORDER — ONDANSETRON HCL 4 MG/2ML IJ SOLN: 4.0000 mg | Freq: Four times a day (QID) | INTRAMUSCULAR | Status: DC | PRN

## 2021-06-27 MED ORDER — PROCHLORPERAZINE MALEATE 5 MG PO TABS: 5.0000 mg | ORAL_TABLET | Freq: Four times a day (QID) | ORAL | Status: DC | PRN

## 2021-06-27 MED ORDER — CHLORHEXIDINE GLUCONATE 0.12% ORAL RINSE (MEDLINE KIT)
15.0000 mL | Freq: Two times a day (BID) | OROMUCOSAL | Status: DC
  Administered 2021-06-27 – 2021-06-30 (×6): 15 mL via OROMUCOSAL

## 2021-06-27 MED ORDER — POLYETHYLENE GLYCOL 3350 17 G PO PACK
17.0000 g | PACK | Freq: Every day | ORAL | Status: DC | PRN
  Administered 2021-07-10: 12:00:00 17 g via ORAL
  Filled 2021-06-27 (×2): qty 1

## 2021-06-27 MED ORDER — LORAZEPAM 2 MG/ML IJ SOLN: 1.0000 mg | INTRAMUSCULAR | Status: DC | PRN

## 2021-06-27 MED ORDER — PIVOT 1.5 CAL PO LIQD
1000.0000 mL | ORAL | Status: DC
  Filled 2021-06-27 (×2): qty 1000

## 2021-06-27 MED ORDER — PROCHLORPERAZINE EDISYLATE 10 MG/2ML IJ SOLN: 5.0000 mg | Freq: Four times a day (QID) | INTRAMUSCULAR | Status: DC | PRN

## 2021-06-27 MED ORDER — DIPHENHYDRAMINE HCL 12.5 MG/5ML PO ELIX: 12.5000 mg | ORAL_SOLUTION | Freq: Four times a day (QID) | ORAL | Status: DC | PRN

## 2021-06-27 MED ORDER — METHOCARBAMOL 750 MG PO TABS
750.0000 mg | ORAL_TABLET | Freq: Three times a day (TID) | ORAL | Status: DC
  Administered 2021-06-27 – 2021-07-02 (×16): 750 mg
  Filled 2021-06-27 (×16): qty 1

## 2021-06-27 MED ORDER — PROCHLORPERAZINE 25 MG RE SUPP: 12.5000 mg | Freq: Four times a day (QID) | RECTAL | Status: DC | PRN

## 2021-06-27 MED ORDER — CHLORHEXIDINE GLUCONATE CLOTH 2 % EX PADS
6.0000 | MEDICATED_PAD | Freq: Every day | CUTANEOUS | Status: DC
  Administered 2021-06-28 – 2021-06-30 (×3): 6 via TOPICAL

## 2021-06-27 MED ORDER — PIVOT 1.5 CAL PO LIQD
1000.0000 mL | ORAL | Status: DC
  Administered 2021-06-27 – 2021-07-02 (×6): 1000 mL
  Filled 2021-06-27 (×12): qty 1000

## 2021-06-27 MED ORDER — TICAGRELOR 90 MG PO TABS
90.0000 mg | ORAL_TABLET | Freq: Two times a day (BID) | ORAL | Status: DC
  Administered 2021-07-02 – 2021-07-13 (×11): 90 mg
  Filled 2021-06-27 (×33): qty 1

## 2021-06-27 MED ORDER — SILODOSIN 8 MG PO CAPS
8.0000 mg | ORAL_CAPSULE | Freq: Every day | ORAL | Status: DC
  Administered 2021-06-27: 8 mg
  Filled 2021-06-27: qty 1

## 2021-06-27 MED ORDER — BISACODYL 10 MG RE SUPP: 10.0000 mg | Freq: Every day | RECTAL | Status: DC | PRN

## 2021-06-27 MED ORDER — PROCHLORPERAZINE EDISYLATE 10 MG/2ML IJ SOLN
5.0000 mg | Freq: Four times a day (QID) | INTRAMUSCULAR | Status: DC | PRN
Start: 2021-06-27 — End: 2021-07-14

## 2021-06-27 MED ORDER — ACETAMINOPHEN 160 MG/5ML PO SOLN
650.0000 mg | Freq: Four times a day (QID) | ORAL | Status: DC
  Administered 2021-06-27 – 2021-07-03 (×21): 650 mg
  Filled 2021-06-27 (×23): qty 20.3

## 2021-06-27 MED ORDER — TRAZODONE HCL 50 MG PO TABS
50.0000 mg | ORAL_TABLET | Freq: Every day | ORAL | Status: DC
  Administered 2021-06-27 – 2021-07-13 (×17): 50 mg
  Filled 2021-06-27 (×18): qty 1

## 2021-06-27 MED ORDER — ORAL CARE MOUTH RINSE
15.0000 mL | Freq: Two times a day (BID) | OROMUCOSAL | Status: DC
  Administered 2021-06-27 – 2021-06-29 (×5): 15 mL via OROMUCOSAL

## 2021-06-27 MED ORDER — ORAL CARE MOUTH RINSE
15.0000 mL | Freq: Two times a day (BID) | OROMUCOSAL | Status: DC
  Administered 2021-06-29 – 2021-07-13 (×28): 15 mL via OROMUCOSAL

## 2021-06-27 MED ORDER — ENOXAPARIN SODIUM 40 MG/0.4ML IJ SOSY
40.0000 mg | PREFILLED_SYRINGE | INTRAMUSCULAR | Status: DC
  Administered 2021-06-28 – 2021-07-15 (×18): 40 mg via SUBCUTANEOUS
  Filled 2021-06-27 (×18): qty 0.4

## 2021-06-27 MED ORDER — CLONAZEPAM 0.25 MG PO TBDP
0.2500 mg | ORAL_TABLET | Freq: Two times a day (BID) | ORAL | Status: DC
  Administered 2021-06-27 – 2021-06-29 (×4): 0.25 mg
  Filled 2021-06-27 (×4): qty 1

## 2021-06-27 MED ORDER — ASPIRIN 81 MG PO CHEW
81.0000 mg | CHEWABLE_TABLET | Freq: Every day | ORAL | Status: DC
  Administered 2021-06-28 – 2021-07-03 (×4): 81 mg
  Filled 2021-06-27 (×6): qty 1

## 2021-06-27 MED ORDER — FREE WATER
200.0000 mL | Freq: Four times a day (QID) | Status: DC
  Administered 2021-06-27 – 2021-06-30 (×11): 200 mL

## 2021-06-27 MED ORDER — FREE WATER: 100.0000 mL | Freq: Four times a day (QID) | Status: DC

## 2021-06-27 MED ORDER — METOCLOPRAMIDE HCL 5 MG/ML IJ SOLN
5.0000 mg | Freq: Four times a day (QID) | INTRAMUSCULAR | Status: DC
  Administered 2021-06-27 – 2021-06-29 (×7): 5 mg via INTRAVENOUS
  Filled 2021-06-27 (×7): qty 2

## 2021-06-27 MED ORDER — BETHANECHOL CHLORIDE 25 MG PO TABS
25.0000 mg | ORAL_TABLET | Freq: Three times a day (TID) | ORAL | Status: DC
  Administered 2021-06-27 – 2021-07-01 (×11): 25 mg
  Filled 2021-06-27 (×11): qty 1

## 2021-06-27 MED ORDER — DIPHENHYDRAMINE HCL 12.5 MG/5ML PO ELIX
12.5000 mg | ORAL_SOLUTION | Freq: Four times a day (QID) | ORAL | Status: DC | PRN
Start: 2021-06-27 — End: 2021-07-14
  Administered 2021-06-29: 06:00:00 25 mg
  Filled 2021-06-27: qty 10

## 2021-06-27 MED ORDER — TICAGRELOR 90 MG PO TABS
90.0000 mg | ORAL_TABLET | Freq: Two times a day (BID) | ORAL | Status: DC
  Administered 2021-06-27 – 2021-07-16 (×27): 90 mg
  Filled 2021-06-27 (×18): qty 1

## 2021-06-27 MED ORDER — ARTIFICIAL TEARS OPHTHALMIC OINT
TOPICAL_OINTMENT | OPHTHALMIC | Status: DC | PRN
  Filled 2021-06-27: qty 3.5

## 2021-06-27 MED ORDER — ENOXAPARIN SODIUM 40 MG/0.4ML IJ SOSY: 40.0000 mg | PREFILLED_SYRINGE | INTRAMUSCULAR | Status: DC

## 2021-06-27 MED ORDER — GUAIFENESIN-DM 100-10 MG/5ML PO SYRP: 5.0000 mL | ORAL_SOLUTION | Freq: Four times a day (QID) | ORAL | Status: DC | PRN

## 2021-06-27 MED ORDER — ALUM & MAG HYDROXIDE-SIMETH 200-200-20 MG/5ML PO SUSP: 30.0000 mL | ORAL | Status: DC | PRN

## 2021-06-27 MED ORDER — GABAPENTIN 250 MG/5ML PO SOLN
300.0000 mg | Freq: Three times a day (TID) | ORAL | Status: DC
  Administered 2021-06-27 – 2021-07-03 (×17): 300 mg
  Filled 2021-06-27 (×20): qty 6

## 2021-06-27 MED ORDER — OXYCODONE HCL 5 MG/5ML PO SOLN
5.0000 mg | ORAL | Status: DC | PRN
  Administered 2021-06-27 – 2021-06-29 (×2): 10 mg
  Administered 2021-06-30: 5 mg
  Administered 2021-06-30: 05:00:00 10 mg
  Filled 2021-06-27 (×2): qty 10
  Filled 2021-06-27: qty 5
  Filled 2021-06-27 (×2): qty 10

## 2021-06-27 MED ORDER — PANTOPRAZOLE 2 MG/ML SUSPENSION
40.0000 mg | Freq: Every day | ORAL | Status: DC
  Administered 2021-06-28 – 2021-07-03 (×6): 40 mg
  Filled 2021-06-27 (×6): qty 20

## 2021-06-27 MED ORDER — GUAIFENESIN-DM 100-10 MG/5ML PO SYRP
5.0000 mL | ORAL_SOLUTION | Freq: Four times a day (QID) | ORAL | Status: DC | PRN
Start: 2021-06-27 — End: 2021-07-03

## 2021-06-27 MED ORDER — ACETAMINOPHEN 325 MG PO TABS
325.0000 mg | ORAL_TABLET | ORAL | Status: DC | PRN
Start: 2021-06-27 — End: 2021-06-27

## 2021-06-27 MED ORDER — CHLORHEXIDINE GLUCONATE 0.12 % MT SOLN
15.0000 mL | Freq: Two times a day (BID) | OROMUCOSAL | Status: DC
  Administered 2021-06-27 – 2021-07-11 (×26): 15 mL via OROMUCOSAL
  Filled 2021-06-27 (×24): qty 15

## 2021-06-27 NOTE — Progress Notes (Signed)
Pt transferred to CIR. All room belongings packed, pt's mother and father at bedside.   Robina Ade, RN
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
## 2021-06-27 NOTE — H&P (Signed)
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 scheduled, discussed with parents  -probably needs a stimulant as well.--observe for now  -antipsychotic agents: N/A 5. Neuropsych: This patient is not capable of making decisions on his own behalf. 6. Skin/Wound Care: Routine pressure relief measures.  7. Fluids/Electrolytes/Nutrition: NPO with tube feeds at 75 cc/hr  --Water flushes 100 cc qid--> increase due to AKI.  -check BMET in the AM 8. Tachycardia: Monitor HR TID--continue Metoprolol bid 9. Acute blood loss anemia: Recheck CBC in am 10. Urinary retention: Continue foley for now.  --Started on Silodosin on 12/16.   11. Hypernatremia w/AKI: will increase water flushes to 200 cc qid.   --may need change in tube feed if not improved with increase in water flushes.  12. Leukocytosis: Monitor for signs of infection.  13. Bouts of agitation: Seroquel and Klonopin being weaned off.  14. Facial fractures: Follow up with Dr. Annalee Genta on outpatient basis. 15. Possible low grade aortic injury: Follow up with Dr. Lenell Antu  in the future 16.  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   -discussed expectations for recovery with parents    Jacquelynn Cree, PA-C 06/27/2021

## 2021-06-27 NOTE — Discharge Summary (Signed)
Physician Discharge Summary  Patient ID: Jason [name] MRN: [id number] DOB/AGE: 13-Nov-2000 20 y.o.  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] Rib fractures  Discharge Diagnoses Trauma [T14.90XA] Pseudoaneurysm ([place]) [I72.9] SAH (subarachnoid hemorrhage) ([place]) [I60.9] Basilar skull fracture ([place]) [S02.109A] Laceration of spleen, initial encounter [S50.539J] Injury of left kidney, initial encounter [S37.002A] Laceration of liver, initial encounter [S36.113A] Closed fracture of temporal bone, initial encounter ([place]) [S02.19XA] Closed fracture of multiple ribs of left side, initial encounter [S22.42XA] 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 Estanislado Pandy - 4 Vessel cerebral arteriogram. RT CFA approach. 06/12/21 Dr. Estanislado Pandy - RT common carotid arteriogram. RT CFA approach. S/P placement of x1 84m x 14 mm Pipeline  shield flow diverter across enlarging RT ICA petrous cav junction pseudoaneurysm. 06/18/21 Dr. DEstanislado Pandy- RT common carotid arteriogram. RT CFA approach. Placement of x 2 pipeline shield flow  diverters across the fistulous communication and the pseudoaneurysm .   HPI: [name] [facility] he was labored breathing in ambulance. He moved all extremities in the bay but was not talking and had labored breathing and was intubated for disability.  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 He underwent angiography by IR, Dr. DEstanislado Pandy 11/27, but risks of stenting outweighed benefit in the setting of multiple solid organ injuries and need for DAPT+DOAC post-procedure. A small ischemic embolus was seen on repeat CT head 11/28 and multiple on MRI brain 11/29. He underwent stent placement 12/1 by Dr. DPatrecia Pour Patient was started on Brilinta and aspirin 81 mg. Patient underwent angiography 12/7 with stent placement at psuedoaneurysm and at carotid cavernous sinus fistula by Dr. DEstanislado Pandy Repeat CTA was completed 12/14 and was stable. He will follow up with interventional radiology and vascular surgery with repeat imaging already ordered.  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  Nerve palsy developed secondary to location of carotid pseudoaneurysms and ophthalmology was consulted. Patient will follow up with Dr. NSammie Benchas an outpatient.  Basilar skull fracture, bilateral temporal bone fracture with involvement of bilateral carotid canals Neurosurgery was consulted Dr. JRonnald Rampand neurosurgery during admission. Treatment of carotid artery injury as above.  SDH/SAH/TBI, IVH with ventriculomegaly Neurosurgery, Dr. JRonnald Ramp consulted. Patient worked with TBI team therapies during admission. He will follow up with neurosurgery outpatient.  Grade 2 liver laceration  Hemoglobin monitored and remained stable.  Grade 3 spleen laceration  Hemoglobin monitored and remained  stable.  Grade 3 kidney laceration  Hemoglobin monitored and remained stable. Renal function monitored as well and remained stable - laceration did not appear to have involvement of collecting system  Acute blood loss anemia (multifactorial)  He underwent transfusion  of several units packed RBCs during admission and anemia improved and remained stable.  Acute hypoxic respiratory failure  Patient intubated in trauma bay on admission for disability. Successfully extubated 12/12 with good respiration on room air at time of discharge  E coli Pneumonia He developed e coli pneumonia during admission and completed appropriate course of ceftriaxone on 12/12  Low grade aortic injury  Vascular surgery Dr. Mora Appl was consulted and did not suspect significant blunt aortic injury and did not recommend acute intervention at that time with plans for possible CTA in the future. Patient to follow up with vascular surgery outpatient.  Sinus Tachycardia He developed tachycardia during admission which improved on scheduled lopressor and norvasc. Continued on discharge.  Acute Kidney Injury  Renal function monitored and AKI resolved by date of discharge  Acute urinary retention  He had ongoing urinary retention and failed trial of void. He was placed on urecholine. Foley was replaced 12/15 and rapaflo added. Foley remained at discharge and patient can have further trial of void while in inpatient rehab.  Rib fractures Right 10-11, Left 1 Patient was initially intubated. Fractures managed with pain control and pulmonary toilet.  Left orbit, maxilla, and pterygoid plate fracture with partial involvement of the 3rd maxillary molar  ENT, Dr. Wilburn Cornelia, was consulted and no surgical intervention indicated. He will need to follow up with Dr. Wilburn Cornelia at [facility] as an outpatient.  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 Speech language pathology followed during admission. He was NPO at time of discharge and  receiving tube feeds at goal as well as on free water for hypernatremia and reglan. Speech was planning repeat modified barium swallow the week following discharge.  On date of discharge patient had appropriately progressed with therapies and met criteria for safe discharge to inpatient rehab with the support of his parents.  Discharge instructions discussed with patient as well as return precautions and all questions and concerns were addressed.   I was not directly involved in this patient's care therefore the information in this discharge summary was taken from the chart.    Follow-up Information     Jalene Mullet, MD Follow up.   Specialty: Ophthalmology Why: F/u with Dr. Posey Pronto as outpatient for thorough retina exam.  Plan for angiography to determine the etiology of the retinal edema/whitening Contact information: [address] [place] [address] [phone]         Jerrell Belfast, MD Follow up.   Specialty: Otolaryngology Why: Regarding facial fractures, and follow-up for audiometric testing Contact information: [address] [place] [address] [phone]         Cherre Robins, MD Follow up.   Specialties: Vascular Surgery, Interventional Cardiology Contact information: [place] [address] [phone]         [facility] Follow up.   Why: As needed Contact information: [place] [id number] [phone]        Luanne Bras, MD. Call.   Specialties: Interventional Radiology, Radiology Why: regarding stents that were placed Contact information: [place] [address] [phone]         Eustace Moore, MD Follow up.   Specialty: Neurosurgery Contact information: [address] [place] [address] [phone]                 Signed: Winferd Humphrey ,  [facility] Surgery 06/27/2021,  11:03 AM Please see Amion for pager number during day hours 7:00am-4:30pm
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
## 2021-06-27 NOTE — Progress Notes (Signed)
Inpatient Rehabilitation Admission Medication Review by a Pharmacist  A complete drug regimen review was completed for this patient to identify any potential clinically significant medication issues.  High Risk Drug Classes Is patient taking? Indication by Medication  Antipsychotic Yes PRN compazine N/V Seroquel - antipsycotic/delirium  Anticoagulant Yes Lovenox for DVT px  Antibiotic No   Opioid Yes PRN oxycodone pain  Antiplatelet Yes ASA/brinlinta - ICA with pseudoaneurysm   Hypoglycemics/insulin No   Vasoactive Medication Yes Metoprolol - Tachycardia  Chemotherapy No   Other Yes Trazodone - sleep silodosin - urinary retention Reglan - bowel     Type of Medication Issue Identified Description of Issue Recommendation(s)  Drug Interaction(s) (clinically significant)     Duplicate Therapy     Allergy     No Medication Administration End Date     Incorrect Dose     Additional Drug Therapy Needed     Significant med changes from prior encounter (inform family/care partners about these prior to discharge).    Other       Clinically significant medication issues were identified that warrant physician communication and completion of prescribed/recommended actions by midnight of the next day:  No  Name of provider notified for urgent issues identified:   Provider Method of Notification:     Pharmacist comments:   Time spent performing this drug regimen review (minutes):  20   Ulyses Southward, PharmD, [place], AAHIVP, CPP Infectious Disease Pharmacist 06/27/2021 6:40 PM
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
## 2021-06-27 NOTE — Progress Notes (Signed)
Patient arrived to unit with parents. Parents stated that they had no questions at the time of unit orientation.  Cletis Media, LPN

## 2021-06-27 NOTE — H&P (Addendum)
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 injury, our rehab approach, and goals.  2.  BCVI of R-ICA with pseudoaneurysm/Antithrombotics: -DVT/anticoagulation:  Pharmaceutical: Lovenox             -antiplatelet therapy: ASA/Brillinta  3. Pain Management: Gabapentin 300 mg TID w/Tylenol qid.               --oxycodone prn 4. Mood/sleep-wake: Neuropsych/LCSW to follow for evaluation when appropriate.              -begin sleep chart             -begin scheduled trazodone  qhs scheduled, discussed with parents             -probably needs a stimulant as well.--observe for now             -antipsychotic agents: N/A 5. Neuropsych: This patient is not capable of making decisions on his own behalf. 6. Skin/Wound Care: Routine pressure relief measures.  7. Fluids/Electrolytes/Nutrition: NPO with tube feeds at 75 cc/hr  --Water flushes 100 cc qid--> increase due to AKI.  -check BMET in the AM 8. Tachycardia: Monitor HR TID--continue Metoprolol bid  -consider propranolol if agitation is an issue 9. Acute blood loss anemia:  Recheck CBC in am 10. Urinary retention: Continue foley for now.             --Started on Silodosin on 12/16.   11. Hypernatremia w/AKI: will increase water flushes to 200 cc qid.              --may need change in tube feed if not improved with increase in water flushes.  12. Leukocytosis: Monitor for signs of infection.  13. Bouts of agitation: Seroquel and Klonopin being weaned off.  14. Facial fractures: Follow up with Dr. Annalee Genta on outpatient basis. 15. Possible low grade aortic injury: Follow up with Dr. Lenell Antu in the future 16. 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              -discussed expectations for recovery with parents       Jacquelynn Cree, PA-C 06/27/2021   I have personally performed a face to face diagnostic evaluation of this patient and formulated the key components of the plan.  Additionally, I have personally reviewed laboratory data, imaging studies, as well as relevant notes and concur with the physician assistant's documentation above.  The patient's status has not changed from the original H&P.  Any changes in documentation from the acute care chart have been noted above.  Ranelle Oyster, MD, [place] [facility]
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
## 2021-06-28 DIAGNOSIS — S069X4S Unspecified intracranial injury with loss of consciousness of 6 hours to 24 hours, sequela: Secondary | ICD-10-CM

## 2021-06-28 DIAGNOSIS — S069X0S Unspecified intracranial injury without loss of consciousness, sequela: Secondary | ICD-10-CM

## 2021-06-28 LAB — CBC WITH DIFFERENTIAL/PLATELET
Abs Immature Granulocytes: 0.26 10*3/uL — ABNORMAL HIGH (ref 0.00–0.07)
Basophils Absolute: 0.2 10*3/uL — ABNORMAL HIGH (ref 0.0–0.1)
Basophils Relative: 1 %
Eosinophils Absolute: 0.8 10*3/uL — ABNORMAL HIGH (ref 0.0–0.5)
Eosinophils Relative: 5 %
HCT: 39.4 % (ref 39.0–52.0)
Hemoglobin: 12.4 g/dL — ABNORMAL LOW (ref 13.0–17.0)
Immature Granulocytes: 2 %
Lymphocytes Relative: 25 %
Lymphs Abs: 3.7 10*3/uL (ref 0.7–4.0)
MCH: 28.6 pg (ref 26.0–34.0)
MCHC: 31.5 g/dL (ref 30.0–36.0)
MCV: 90.8 fL (ref 80.0–100.0)
Monocytes Absolute: 1 10*3/uL (ref 0.1–1.0)
Monocytes Relative: 6 %
Neutro Abs: 9.3 10*3/uL — ABNORMAL HIGH (ref 1.7–7.7)
Neutrophils Relative %: 61 %
Platelets: 553 10*3/uL — ABNORMAL HIGH (ref 150–400)
RBC: 4.34 MIL/uL (ref 4.22–5.81)
RDW: 13.9 % (ref 11.5–15.5)
WBC: 15.2 10*3/uL — ABNORMAL HIGH (ref 4.0–10.5)
nRBC: 0 % (ref 0.0–0.2)

## 2021-06-28 LAB — GLUCOSE, CAPILLARY
Glucose-Capillary: 105 mg/dL — ABNORMAL HIGH (ref 70–99)
Glucose-Capillary: 118 mg/dL — ABNORMAL HIGH (ref 70–99)
Glucose-Capillary: 123 mg/dL — ABNORMAL HIGH (ref 70–99)
Glucose-Capillary: 91 mg/dL (ref 70–99)
Glucose-Capillary: 95 mg/dL (ref 70–99)
Glucose-Capillary: 95 mg/dL (ref 70–99)
Glucose-Capillary: 96 mg/dL (ref 70–99)

## 2021-06-28 LAB — COMPREHENSIVE METABOLIC PANEL
ALT: 58 U/L — ABNORMAL HIGH (ref 0–44)
AST: 53 U/L — ABNORMAL HIGH (ref 15–41)
Albumin: 3.7 g/dL (ref 3.5–5.0)
Alkaline Phosphatase: 159 U/L — ABNORMAL HIGH (ref 38–126)
Anion gap: 9 (ref 5–15)
BUN: 43 mg/dL — ABNORMAL HIGH (ref 6–20)
CO2: 20 mmol/L — ABNORMAL LOW (ref 22–32)
Calcium: 9.5 mg/dL (ref 8.9–10.3)
Chloride: 114 mmol/L — ABNORMAL HIGH (ref 98–111)
Creatinine, Ser: 0.88 mg/dL (ref 0.61–1.24)
GFR, Estimated: >60 mL/min (ref >60)
Glucose, Bld: 114 mg/dL — ABNORMAL HIGH (ref 70–99)
Potassium: 4.3 mmol/L (ref 3.5–5.1)
Sodium: 143 mmol/L (ref 135–145)
Total Bilirubin: 0.7 mg/dL (ref 0.3–1.2)
Total Protein: 8.9 g/dL — ABNORMAL HIGH (ref 6.5–8.1)

## 2021-06-28 MED ORDER — SODIUM CHLORIDE (PF) 0.9 % IJ SOLN
INTRAMUSCULAR | Status: AC
  Filled 2021-06-28: qty 10

## 2021-06-28 MED ORDER — WHITE PETROLATUM EX OINT
TOPICAL_OINTMENT | CUTANEOUS | Status: AC
  Administered 2021-06-28: 1
  Filled 2021-06-28: qty 28.35

## 2021-06-28 MED ORDER — QUETIAPINE FUMARATE 25 MG PO TABS
25.0000 mg | ORAL_TABLET | Freq: Three times a day (TID) | ORAL | Status: DC
Start: 2021-06-28 — End: 2021-06-30
  Administered 2021-06-28 – 2021-06-30 (×6): 25 mg
  Filled 2021-06-28 (×7): qty 1

## 2021-06-28 NOTE — Plan of Care (Signed)
°  Problem: Consults Goal: RH BRAIN INJURY PATIENT EDUCATION Description: Description: See Patient Education module for eduction specifics Outcome: Progressing Goal: Skin Care Protocol Initiated - if Braden Score 18 or less Description: If consults are not indicated, leave blank or document N/A Outcome: Progressing   Problem: RH BOWEL ELIMINATION Goal: RH STG MANAGE BOWEL WITH ASSISTANCE Description: STG Manage Bowel with Min Assistance. Outcome: Progressing Goal: RH STG MANAGE BOWEL W/MEDICATION W/ASSISTANCE Description: STG Manage Bowel with Medication with Min Assistance. Outcome: Progressing   Problem: RH BLADDER ELIMINATION Goal: RH STG MANAGE BLADDER WITH ASSISTANCE Description: STG Manage Bladder With Min Assistance Outcome: Progressing Goal: RH STG MANAGE BLADDER WITH MEDICATION WITH ASSISTANCE Description: STG Manage Bladder With Medication With Min Assistance. Outcome: Progressing   Problem: RH SKIN INTEGRITY Goal: RH STG SKIN FREE OF INFECTION/BREAKDOWN Description: Skin to remain free from infection and additional breakdown with min assist. Outcome: Progressing Goal: RH STG MAINTAIN SKIN INTEGRITY WITH ASSISTANCE Description: STG Maintain Skin Integrity With Min Assistance. Outcome: Progressing Goal: RH STG ABLE TO PERFORM INCISION/WOUND CARE W/ASSISTANCE Description: STG Able To Perform Incision/Wound Care With [employer]. Outcome: Progressing   Problem: RH SAFETY Goal: RH STG ADHERE TO SAFETY PRECAUTIONS W/ASSISTANCE/DEVICE Description: STG Adhere to Safety Precautions With Cues and Reminders. Outcome: Progressing Goal: RH STG DECREASED RISK OF FALL WITH ASSISTANCE Description: STG Decreased Risk of Fall With [employer]. Outcome: Progressing   Problem: RH COGNITION-NURSING Goal: RH STG USES MEMORY AIDS/STRATEGIES W/ASSIST TO PROBLEM SOLVE Description: STG Uses Memory Aids/Strategies With Min Assistance to Problem Solve. Outcome: Progressing Goal: RH  STG ANTICIPATES NEEDS/CALLS FOR ASSIST W/ASSIST/CUES Description: STG Anticipates Needs/Calls for Assist With Min Assistance/Cues. Outcome: Progressing   Problem: RH PAIN MANAGEMENT Goal: RH STG PAIN MANAGED AT OR BELOW PT'S PAIN GOAL Description: < 3 on a 0-10/FACES pain scale. Outcome: Progressing   Problem: RH KNOWLEDGE DEFICIT BRAIN INJURY Goal: RH STG INCREASE KNOWLEDGE OF SELF CARE AFTER BRAIN INJURY Description: Patient will demonstrate knowledge of medication/pain management, skin/wound care, weight bearing precautions, and safety awareness with educational materials and handouts with min assist at discharge. Outcome: Progressing Goal: RH STG INCREASE KNOWLEDGE OF DYSPHAGIA/FLUID INTAKE Description: Patient will demonstrate knowledge of safe swallowing precautions with educational materials and handouts provided by staff with min assist. Outcome: Progressing

## 2021-06-28 NOTE — Plan of Care (Signed)
Problem: RH Balance Goal: LTG Patient will maintain dynamic sitting balance (PT) Description: LTG:  Patient will maintain dynamic sitting balance with assistance during mobility activities (PT) Flowsheets (Taken 06/28/2021 1833) LTG: Pt will maintain dynamic sitting balance during mobility activities with:: Supervision/Verbal cueing Goal: LTG Patient will maintain dynamic standing balance (PT) Description: LTG:  Patient will maintain dynamic standing balance with assistance during mobility activities (PT) Flowsheets (Taken 06/28/2021 1833) LTG: Pt will maintain dynamic standing balance during mobility activities with:: Supervision/Verbal cueing   Problem: Sit to Stand Goal: LTG:  Patient will perform sit to stand with assistance level (PT) Description: LTG:  Patient will perform sit to stand with assistance level (PT) Flowsheets (Taken 06/28/2021 1833) LTG: PT will perform sit to stand in preparation for functional mobility with assistance level: Supervision/Verbal cueing   Problem: RH Bed Mobility Goal: LTG Patient will perform bed mobility with assist (PT) Description: LTG: Patient will perform bed mobility with assistance, with/without cues (PT). Flowsheets (Taken 06/28/2021 1833) LTG: Pt will perform bed mobility with assistance level of: Supervision/Verbal cueing   Problem: RH Bed to Chair Transfers Goal: LTG Patient will perform bed/chair transfers w/assist (PT) Description: LTG: Patient will perform bed to chair transfers with assistance (PT). Flowsheets (Taken 06/28/2021 1833) LTG: Pt will perform Bed to Chair Transfers with assistance level: Supervision/Verbal cueing   Problem: RH Car Transfers Goal: LTG Patient will perform car transfers with assist (PT) Description: LTG: Patient will perform car transfers with assistance (PT). Flowsheets (Taken 06/28/2021 1833) LTG: Pt will perform car transfers with assist:: Supervision/Verbal cueing   Problem: RH Ambulation Goal: LTG  Patient will ambulate in controlled environment (PT) Description: LTG: Patient will ambulate in a controlled environment, # of feet with assistance (PT). Flowsheets (Taken 06/28/2021 1833) LTG: Pt will ambulate in controlled environ  assist needed:: Supervision/Verbal cueing Goal: LTG Patient will ambulate in home environment (PT) Description: LTG: Patient will ambulate in home environment, # of feet with assistance (PT). Flowsheets (Taken 06/28/2021 1833) LTG: Pt will ambulate in home environ  assist needed:: Supervision/Verbal cueing   Problem: RH Wheelchair Mobility Goal: LTG Patient will propel w/c in controlled environment (PT) Description: LTG: Patient will propel wheelchair in controlled environment, # of feet with assist (PT) Flowsheets (Taken 06/28/2021 1833) LTG: Pt will propel w/c in controlled environ  assist needed:: Supervision/Verbal cueing LTG: Propel w/c distance in controlled environment: >150 ft   Problem: RH Stairs Goal: LTG Patient will ambulate up and down stairs w/assist (PT) Description: LTG: Patient will ambulate up and down # of stairs with assistance (PT) Flowsheets (Taken 06/28/2021 1833) LTG: Pt will ambulate up/down stairs assist needed:: Contact Guard/Touching assist LTG: Pt will  ambulate up and down number of stairs: 3 steps with R rail for home entry, full flight with 1 rail for second floor access at home   Problem: RH Attention Goal: LTG Patient will demonstrate this level of attention during functional activites (PT) Description: LTG:  Patient will demonstrate this level of attention during functional activites (PT) Flowsheets (Taken 06/28/2021 1833) Patient will demonstrate this level of attention during functional activites: Sustained Patient will demonstrate above attention level in the following environment: Home LTG: Patient will demonstrate attention during functional mobility with assistance of: Supervision   Problem: RH Awareness Goal: LTG:  Patient will demonstrate awareness during functional activites type of (PT) Description: LTG: Patient will demonstrate awareness during functional activites type of (PT) Flowsheets (Taken 06/28/2021 1833) Patient will demonstrate awareness during functional activites type of: Emergent LTG:  Patient will demonstrate awareness during functional activites type of (PT): Supervision

## 2021-06-28 NOTE — Progress Notes (Signed)
Patient transferred to room 4W16.
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
## 2021-06-28 NOTE — Plan of Care (Signed)
°  Problem: RH Swallowing Goal: LTG Patient will consume least restrictive diet using compensatory strategies with assistance (SLP) Description: LTG:  Patient will consume least restrictive diet using compensatory strategies with assistance (SLP) Flowsheets (Taken 06/28/2021 1301) LTG: Pt Patient will consume least restrictive diet using compensatory strategies with assistance of (SLP): Supervision Goal: LTG Pt will demonstrate functional change in swallow as evidenced by bedside/clinical objective assessment (SLP) Description: LTG: Patient will demonstrate functional change in swallow as evidenced by bedside/clinical objective assessment (SLP) Flowsheets (Taken 06/28/2021 1301) LTG: Patient will demonstrate functional change in swallow as evidenced by bedside/clinical objective assessment: Oropharyngeal swallow   Problem: RH Comprehension Communication Goal: LTG Patient will comprehend basic/complex auditory (SLP) Description: LTG: Patient will comprehend basic/complex auditory information with cues (SLP). Flowsheets (Taken 06/28/2021 1301) LTG: Patient will comprehend: Basic auditory information LTG: Patient will comprehend auditory information with cueing (SLP): Minimal Assistance - Patient > 75%   Problem: RH Expression Communication Goal: LTG Patient will express needs/wants via multi-modal(SLP) Description: LTG:  Patient will express needs/wants via multi-modal communication (gestures/written, etc) with cues (SLP) Flowsheets (Taken 06/28/2021 1301) LTG: Patient will express needs/wants via multimodal communication (gestures/written, etc) with cueing (SLP): Minimal Assistance - Patient > 75% Goal: LTG Patient will verbally express basic/complex needs(SLP) Description: LTG:  Patient will verbally express basic/complex needs, wants or ideas with cues  (SLP) Flowsheets (Taken 06/28/2021 1301) LTG: Patient will verbally express basic/complex needs, wants or ideas (SLP): Moderate Assistance -  Patient 50 - 74%   Problem: RH Attention Goal: LTG Patient will demonstrate this level of attention during functional activites (SLP) Description: LTG:  Patient will will demonstrate this level of attention during functional activites (SLP) Flowsheets (Taken 06/28/2021 1301) Patient will demonstrate during cognitive/linguistic activities the attention type of: Sustained Patient will demonstrate this level of attention during cognitive/linguistic activities in: Controlled LTG: Patient will demonstrate this level of attention during cognitive/linguistic activities with assistance of (SLP): Minimal Assistance - Patient > 75%

## 2021-06-28 NOTE — Plan of Care (Signed)
Problem: RH Balance Goal: LTG: Patient will maintain dynamic sitting balance (OT) Description: LTG:  Patient will maintain dynamic sitting balance with assistance during activities of daily living (OT) Flowsheets (Taken 06/28/2021 1251) LTG: Pt will maintain dynamic sitting balance during ADLs with: Supervision/Verbal cueing Goal: LTG Patient will maintain dynamic standing with ADLs (OT) Description: LTG:  Patient will maintain dynamic standing balance with assist during activities of daily living (OT)  Flowsheets (Taken 06/28/2021 1251) LTG: Pt will maintain dynamic standing balance during ADLs with: Supervision/Verbal cueing   Problem: Sit to Stand Goal: LTG:  Patient will perform sit to stand in prep for activites of daily living with assistance level (OT) Description: LTG:  Patient will perform sit to stand in prep for activites of daily living with assistance level (OT) Flowsheets (Taken 06/28/2021 1251) LTG: PT will perform sit to stand in prep for activites of daily living with assistance level: Supervision/Verbal cueing   Problem: RH Grooming Goal: LTG Patient will perform grooming w/assist,cues/equip (OT) Description: LTG: Patient will perform grooming with assist, with/without cues using equipment (OT) Flowsheets (Taken 06/28/2021 1251) LTG: Pt will perform grooming with assistance level of: Supervision/Verbal cueing   Problem: RH Bathing Goal: LTG Patient will bathe all body parts with assist levels (OT) Description: LTG: Patient will bathe all body parts with assist levels (OT) Flowsheets (Taken 06/28/2021 1251) LTG: Pt will perform bathing with assistance level/cueing: Supervision/Verbal cueing   Problem: RH Dressing Goal: LTG Patient will perform upper body dressing (OT) Description: LTG Patient will perform upper body dressing with assist, with/without cues (OT). Flowsheets (Taken 06/28/2021 1251) LTG: Pt will perform upper body dressing with assistance level of:  Supervision/Verbal cueing Goal: LTG Patient will perform lower body dressing w/assist (OT) Description: LTG: Patient will perform lower body dressing with assist, with/without cues in positioning using equipment (OT) Flowsheets (Taken 06/28/2021 1251) LTG: Pt will perform lower body dressing with assistance level of: Supervision/Verbal cueing   Problem: RH Toileting Goal: LTG Patient will perform toileting task (3/3 steps) with assistance level (OT) Description: LTG: Patient will perform toileting task (3/3 steps) with assistance level (OT)  Flowsheets (Taken 06/28/2021 1251) LTG: Pt will perform toileting task (3/3 steps) with assistance level: Supervision/Verbal cueing   Problem: RH Vision Goal: RH LTG Vision Consulting civil engineer) Flowsheets (Taken 06/28/2021 1251) LTG: Vision Goals: Pt will incorporate visual scanning strategies with no more than min verbal cuing to locate required ADL items for morning routine.   Problem: RH Functional Use of Upper Extremity Goal: LTG Patient will use RT/LT upper extremity as a (OT) Description: LTG: Patient will use right/left upper extremity as a stabilizer/gross assist/diminished/nondominant/dominant level with assist, with/without cues during functional activity (OT) Flowsheets (Taken 06/28/2021 1251) LTG: Use of upper extremity in functional activities: LUE as dominant level LTG: Pt will use upper extremity in functional activity with assistance level of: Supervision/Verbal cueing   Problem: RH Toilet Transfers Goal: LTG Patient will perform toilet transfers w/assist (OT) Description: LTG: Patient will perform toilet transfers with assist, with/without cues using equipment (OT) Flowsheets (Taken 06/28/2021 1251) LTG: Pt will perform toilet transfers with assistance level of: Supervision/Verbal cueing   Problem: RH Tub/Shower Transfers Goal: LTG Patient will perform tub/shower transfers w/assist (OT) Description: LTG: Patient will perform tub/shower  transfers with assist, with/without cues using equipment (OT) Flowsheets (Taken 06/28/2021 1251) LTG: Pt will perform tub/shower stall transfers with assistance level of: Supervision/Verbal cueing   Problem: RH Furniture Transfers Goal: LTG Patient will perform furniture transfers w/assist (OT/PT) Description: LTG: Patient  will perform furniture transfers  with assistance (OT/PT). Flowsheets (Taken 06/28/2021 1251) LTG: Pt will perform furniture transfers with assist:: Supervision/Verbal cueing   Problem: RH Attention Goal: LTG Patient will demonstrate this level of attention during functional activites (OT) Description: LTG:  Patient will demonstrate this level of attention during functional activites  (OT) Flowsheets (Taken 06/28/2021 1251) Patient will demonstrate this level of attention during functional activites: Sustained Patient will demonstrate above attention level in the following environment: Home LTG: Patient will demonstrate this level of attention during functional activites (OT): Minimal Assistance - Patient > 75%   Problem: RH Awareness Goal: LTG: Patient will demonstrate awareness during functional activites type of (OT) Description: LTG: Patient will demonstrate awareness during functional activites type of (OT) Flowsheets (Taken 06/28/2021 1251) Patient will demonstrate awareness during functional activites type of: Intellectual LTG: Patient will demonstrate awareness during functional activites type of (OT): Minimal Assistance - Patient > 75%
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
## 2021-06-28 NOTE — Progress Notes (Signed)
Pt slept on and off through the night for a total of six hours. When pt is awake he is continually attempts to remove his green mittens and reaches to his foley. Medicated with PRN once for S/Sx of pain.
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
## 2021-06-29 LAB — GLUCOSE, CAPILLARY
Glucose-Capillary: 107 mg/dL — ABNORMAL HIGH (ref 70–99)
Glucose-Capillary: 94 mg/dL (ref 70–99)
Glucose-Capillary: 100 mg/dL — ABNORMAL HIGH (ref 70–99)
Glucose-Capillary: 113 mg/dL — ABNORMAL HIGH (ref 70–99)
Glucose-Capillary: 132 mg/dL — ABNORMAL HIGH (ref 70–99)

## 2021-06-29 MED ORDER — CLONAZEPAM 0.25 MG PO TBDP
0.2500 mg | ORAL_TABLET | Freq: Every day | ORAL | Status: DC
Start: 2021-06-30 — End: 2021-07-03
  Administered 2021-06-30 – 2021-07-02 (×3): 0.25 mg
  Filled 2021-06-29 (×3): qty 1

## 2021-06-29 NOTE — Plan of Care (Signed)
°  Problem: Consults Goal: RH BRAIN INJURY PATIENT EDUCATION Description: Description: See Patient Education module for eduction specifics Outcome: Progressing Goal: Skin Care Protocol Initiated - if Braden Score 18 or less Description: If consults are not indicated, leave blank or document N/A Outcome: Progressing   Problem: RH BOWEL ELIMINATION Goal: RH STG MANAGE BOWEL WITH ASSISTANCE Description: STG Manage Bowel with Min Assistance. Outcome: Progressing Goal: RH STG MANAGE BOWEL W/MEDICATION W/ASSISTANCE Description: STG Manage Bowel with Medication with Min Assistance. Outcome: Progressing   Problem: RH BLADDER ELIMINATION Goal: RH STG MANAGE BLADDER WITH ASSISTANCE Description: STG Manage Bladder With Min Assistance Outcome: Progressing Goal: RH STG MANAGE BLADDER WITH MEDICATION WITH ASSISTANCE Description: STG Manage Bladder With Medication With Min Assistance. Outcome: Progressing   Problem: RH SKIN INTEGRITY Goal: RH STG SKIN FREE OF INFECTION/BREAKDOWN Description: Skin to remain free from infection and additional breakdown with min assist. Outcome: Progressing Goal: RH STG MAINTAIN SKIN INTEGRITY WITH ASSISTANCE Description: STG Maintain Skin Integrity With Min Assistance. Outcome: Progressing Goal: RH STG ABLE TO PERFORM INCISION/WOUND CARE W/ASSISTANCE Description: STG Able To Perform Incision/Wound Care With [employer]. Outcome: Progressing   Problem: RH SAFETY Goal: RH STG ADHERE TO SAFETY PRECAUTIONS W/ASSISTANCE/DEVICE Description: STG Adhere to Safety Precautions With Cues and Reminders. Outcome: Progressing Goal: RH STG DECREASED RISK OF FALL WITH ASSISTANCE Description: STG Decreased Risk of Fall With [employer]. Outcome: Progressing   Problem: RH COGNITION-NURSING Goal: RH STG USES MEMORY AIDS/STRATEGIES W/ASSIST TO PROBLEM SOLVE Description: STG Uses Memory Aids/Strategies With Min Assistance to Problem Solve. Outcome: Progressing Goal: RH  STG ANTICIPATES NEEDS/CALLS FOR ASSIST W/ASSIST/CUES Description: STG Anticipates Needs/Calls for Assist With Min Assistance/Cues. Outcome: Progressing   Problem: RH PAIN MANAGEMENT Goal: RH STG PAIN MANAGED AT OR BELOW PT'S PAIN GOAL Description: < 3 on a 0-10/FACES pain scale. Outcome: Progressing   Problem: RH KNOWLEDGE DEFICIT BRAIN INJURY Goal: RH STG INCREASE KNOWLEDGE OF SELF CARE AFTER BRAIN INJURY Description: Patient will demonstrate knowledge of medication/pain management, skin/wound care, weight bearing precautions, and safety awareness with educational materials and handouts with min assist at discharge. Outcome: Progressing Goal: RH STG INCREASE KNOWLEDGE OF DYSPHAGIA/FLUID INTAKE Description: Patient will demonstrate knowledge of safe swallowing precautions with educational materials and handouts provided by staff with min assist. Outcome: Progressing   Problem: RH Vision Goal: RH LTG Vision (Specify) Outcome: Progressing

## 2021-06-29 NOTE — IPOC Note (Signed)
Overall Plan of Care [facility]) Patient Details Name: Jason [name] MRN: [id number] DOB: Mar 29, 2001  Admitting Diagnosis: TBI (traumatic brain injury)  Hospital Problems: Principal Problem:   TBI (traumatic brain injury)     Functional Problem List: Nursing Behavior, Bladder, Bowel, Endurance, Medication Management, Nutrition, Perception, Safety, Skin Integrity, Motor, Edema, Pain, Sensory  PT Balance, Nutrition, Skin Integrity, Pain, Behavior, Edema, Perception, Safety, Endurance, Motor, Sensory  OT Balance, Behavior, Cognition, Endurance, Motor, Nutrition, Perception, Safety, Vision, Skin Integrity, Sensory  SLP Cognition, Safety, Nutrition  TR         Basic ADLs: OT Grooming, Bathing, Dressing, Toileting     Advanced  ADLs: OT       Transfers: PT Bed Mobility, Bed to Chair, Set designer, Occupational psychologist, Research scientist (life sciences): PT Ambulation, Psychologist, prison and probation services, Stairs     Additional Impairments: OT Fuctional Use of Upper Extremity  SLP Swallowing, Communication, Social Cognition comprehension, expression Attention  TR      Anticipated Outcomes Item Anticipated Outcome  Self Feeding NPO - TBD  Swallowing  Supervision   Basic self-care  S  Toileting  S   Bathroom Transfers S  Bowel/Bladder  min assist  Transfers  Supervision using LRAD  Locomotion  Supervision using LRAD >200 ft  Communication  min-mod A  Cognition     Pain  < 3  Safety/Judgment  min assist and no falls   Therapy Plan: PT Intensity: Minimum of 1-2 x/day ,45 to 90 minutes PT Frequency: 5 out of 7 days PT Duration Estimated Length of Stay: 4 weeks OT Intensity: Minimum of 1-2 x/day, 45 to 90 minutes OT Frequency: 5 out of 7 days OT Duration/Estimated Length of Stay: 4-4.5 weeks SLP Intensity: Minumum of 1-2 x/day, 30 to 90 minutes SLP Frequency: 3 to 5 out of 7 days SLP Duration/Estimated Length of Stay: 4-4.5 weeks   Due to the current state of emergency, patients may not  be receiving their 3-hours of Medicare-mandated therapy.   Team Interventions: Nursing Interventions Patient/Family Education, Bladder Management, Bowel Management, Pain Management, Medication Management, Skin Care/Wound Management, Cognitive Remediation/Compensation, Dysphagia/Aspiration Precaution Training, Discharge Planning, Psychosocial Support  PT interventions Ambulation/gait training, Cognitive remediation/compensation, Discharge planning, DME/adaptive equipment instruction, Functional mobility training, Pain management, Psychosocial support, Splinting/orthotics, Therapeutic Activities, UE/LE Strength taining/ROM, Visual/perceptual remediation/compensation, Wheelchair propulsion/positioning, UE/LE Coordination activities, Therapeutic Exercise, Stair training, Skin care/wound management, Patient/family education, Neuromuscular re-education, Functional electrical stimulation, Disease management/prevention, Firefighter, Warden/ranger  OT Interventions Disease mangement/prevention, Warden/ranger, Neuromuscular re-education, Self Care/advanced ADL retraining, Therapeutic Exercise, Wheelchair propulsion/positioning, UE/LE Strength taining/ROM, Pain management, Skin care/wound managment, DME/adaptive equipment instruction, Cognitive remediation/compensation, Community reintegration, Functional electrical stimulation, Patient/family education, Splinting/orthotics, UE/LE Coordination activities, Visual/perceptual remediation/compensation, Therapeutic Activities, Psychosocial support, Functional mobility training, Discharge planning  SLP Interventions Cueing hierarchy, Dysphagia/aspiration precaution training, Functional tasks, Patient/family education, Therapeutic Activities, Therapeutic Exercise, Speech/Language facilitation, Multimodal communication approach, Internal/external aids, Environmental controls  TR Interventions    SW/CM Interventions     Barriers to  Discharge MD  Medical stability  Nursing Decreased caregiver support, Home environment access/layout, Incontinence, Wound Care, Lack of/limited family support, Insurance for SNF coverage, Weight bearing restrictions, Medication compliance, Behavior, Nutrition means Discharging to 2 level home with 3 steps to enter and right rail. Able to live on main level with bedroom/bathroom. Discharging to parents home and mother works from home.  PT Inaccessible home environment, Decreased caregiver support, Home environment access/layout, Incontinence, Neurogenic Bowel & Bladder, Behavior, Nutrition means    OT Inaccessible home  environment, Incontinence, Behavior, Insurance for SNF coverage    SLP Nutrition means    SW       Team Discharge Planning: Destination: PT-Home ,OT- Home , SLP-Home Projected Follow-up: PT-Outpatient PT, OT-  Outpatient OT, 24 hour supervision/assistance, SLP-Home Health SLP, Outpatient SLP, 24 hour supervision/assistance Projected Equipment Needs: PT-To be determined, OT- To be determined, SLP-To be determined Equipment Details: PT- , OT-  Patient/family involved in discharge planning: PT- Patient unable/family or caregiver not available,  OT-Family member/caregiver, SLP-Patient  MD ELOS: 28-30 days Medical Rehab Prognosis:  Excellent Assessment: The patient has been admitted for CIR therapies with the diagnosis of TBI, RLAS IV/V. The team will be addressing functional mobility, strength, stamina, balance, safety, adaptive techniques and equipment, self-care, bowel and bladder mgt, patient and caregiver education, NMR, cognition, communication, pain mgt, behavior, swallowing. Goals have been set at supervision for self-care and mobility and supervision to min assist with cognition and mod I with swallowing.   Due to the current state of emergency, patients may not be receiving their 3 hours per day of Medicare-mandated therapy.    Ranelle Oyster, MD, FAAPMR     See  Team Conference Notes for weekly updates to the plan of care

## 2021-06-29 NOTE — Progress Notes (Signed)
PROGRESS NOTE   Subjective/Complaints: Sleeping but awakens briefly to physical and verbal stim  Spoke with parents regarding d/c of IV, Foley and repeat MBS (possible removal of NGT)  Review of systems- unable to obtain due to mental status Objective:   No results found. Recent Labs    06/27/21 0331 06/28/21 0501  WBC 15.1* 15.2*  HGB 12.2* 12.4*  HCT 39.2 39.4  PLT 564* 553*    Recent Labs    06/27/21 0331 06/28/21 0501  NA 147* 143  K 4.1 4.3  CL 117* 114*  CO2 19* 20*  GLUCOSE 115* 114*  BUN 42* 43*  CREATININE 0.81 0.88  CALCIUM 9.5 9.5     Intake/Output Summary (Last 24 hours) at 06/29/2021 1009 Last data filed at 06/29/2021 0432 Gross per 24 hour  Intake 135 ml  Output 950 ml  Net -815 ml      Pressure Injury 06/22/21 Ear Right Stage 2 -  Partial thickness loss of dermis presenting as a shallow open injury with a red, pink wound bed without slough. Pressure injury on cartilage of right ear from head laying on right side (Active)  06/22/21 0130  Location: Ear  Location Orientation: Right  Staging: Stage 2 -  Partial thickness loss of dermis presenting as a shallow open injury with a red, pink wound bed without slough.  Wound Description (Comments): Pressure injury on cartilage of right ear from head laying on right side  Present on Admission: No    Physical Exam: Vital Signs Blood pressure (!) 120/98, pulse 100, temperature 98.6 F (37 C), resp. rate 17, height 5\' 10"  (1.778 m), weight 77 kg, SpO2 100 %. HEENT:  NG  feeding tube R nares General: No acute distress Mood and affect are appropriate Heart: Regular rate and rhythm no rubs murmurs or extra sounds Lungs: Clear to auscultation, breathing unlabored, no rales or wheezes Abdomen: Positive bowel sounds, soft nontender to palpation, nondistended Extremities: No clubbing, cyanosis, or edema Skin: No evidence of breakdown, no evidence of  rash   Neurologic: Right complete cranial nerve III palsy, motor strength is grossly 4/5 in bilateral deltoid, bicep, tricep, grip, hip flexor, knee extensors, ankle dorsiflexor and plantar flexor Limited by cooperation No evidence of agitation, calm and cooperative with donning of mitts Sensory exam patient cannot cooperate with sensory examination. Musculoskeletal: Full passive range of motion in all 4 extremities. No joint swelling   Assessment/Plan: 1. Functional deficits which require 3+ hours per day of interdisciplinary therapy in a comprehensive inpatient rehab setting. Physiatrist is providing close team supervision and 24 hour management of active medical problems listed below. Physiatrist and rehab team continue to assess barriers to discharge/monitor patient progress toward functional and medical goals  Care Tool:  Bathing        Body parts bathed by helper: Face, Right arm, Left arm, Chest, Abdomen, Front perineal area, Buttocks, Right upper leg, Left upper leg, Right lower leg, Left lower leg     Bathing assist Assist Level: 2 Helpers     Upper Body Dressing/Undressing Upper body dressing   What is the patient wearing?: Hospital gown only, Pull over shirt    Upper body  assist Assist Level: Moderate Assistance - Patient 50 - 74%    Lower Body Dressing/Undressing Lower body dressing      What is the patient wearing?: Incontinence brief, Pants     Lower body assist Assist for lower body dressing: Moderate Assistance - Patient 50 - 74%     Toileting Toileting    Toileting assist Assist for toileting: 2 Helpers     Transfers Chair/bed transfer  Transfers assist  Chair/bed transfer activity did not occur: Safety/medical concerns  Chair/bed transfer assist level: 2 Helpers     Locomotion Ambulation   Ambulation assist      Assist level: 2 helpers Assistive device: Other (comment) (B UEs on therapist's shoulders with +2 w/c follow) Max  distance: 15 ft   Walk 10 feet activity   Assist     Assist level: 2 helpers Assistive device: Other (comment) (B UEs on therapist's shoulders with +2 w/c follow)   Walk 50 feet activity   Assist Walk 50 feet with 2 turns activity did not occur: Safety/medical concerns         Walk 150 feet activity   Assist Walk 150 feet activity did not occur: Safety/medical concerns         Walk 10 feet on uneven surface  activity   Assist Walk 10 feet on uneven surfaces activity did not occur: Safety/medical concerns         Wheelchair     Assist Is the patient using a wheelchair?: Yes Type of Wheelchair:  (TIS for safety due to decreased postural control in sitting) Wheelchair activity did not occur: Safety/medical concerns         Wheelchair 50 feet with 2 turns activity    Assist    Wheelchair 50 feet with 2 turns activity did not occur: Safety/medical concerns       Wheelchair 150 feet activity     Assist  Wheelchair 150 feet activity did not occur: Safety/medical concerns       Blood pressure (!) 120/98, pulse 100, temperature 98.6 F (37 C), resp. rate 17, height 5\' 10"  (1.778 m), weight 77 kg, SpO2 100 %.  Medical Problem List and Plan: 1. 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  BCVI of R-ICA with pseudoaneurysm/Antithrombotics: -DVT/anticoagulation:  Pharmaceutical: Lovenox             -antiplatelet therapy: ASA/Brillinta  3. Pain Management: Gabapentin 300 mg TID w/Tylenol qid.               --oxycodone prn 4. Mood/sleep-wake: Neuropsych/LCSW to follow for evaluation when appropriate.              -begin sleep chart              -begin scheduled trazodone 50mg  qhs scheduled, discussed with parents             -probably needs a stimulant as well.--observe for now             -antipsychotic agents: N/A 5. Neuropsych: This patient is not capable of making decisions on his  own behalf. 6. Skin/Wound Care: Routine pressure relief measures.  7. Fluids/Electrolytes/Nutrition: NPO with tube feeds at 75 cc/hr  --Water flushes 100 cc qid--> increase due to AKI. BUN elevated but stable , has high urine output  8. Tachycardia: Monitor HR TID--continue Metoprolol bid              Vitals:   06/28/21 2051 06/29/21 0426  BP: 130/80 (!) 120/98  Pulse: 99 100  Resp: 19 17  Temp: 97.9 F (36.6 C) 98.6 F (37 C)  SpO2: 98% 100%    9. Acute blood loss anemia: improved Hgb stable >12gm 10. Urinary retention: Continue foley for now.             --Started on Silodosin on 12/16.  Should be ready for voiding trial this week 11. Hypernatremia w/AKI: Improved will increase water flushes to 200 cc qid. BUN still up with              -UO ~2L 12. Leukocytosis: Monitor for signs of infection.  13. Bouts of agitation: Seroquel and Klonopin being weaned off. Mental status improving but somnolent this am . Change klonopin to qhs  14. Facial fractures: Follow up with Dr. Annalee Genta on outpatient basis. 15. Possible low grade aortic injury: Follow up with Dr. Lenell Antu in the future 16. 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              17,  Dysphagia- hopefully can repeat MBS this week  LOS: 2 days A FACE TO FACE EVALUATION WAS PERFORMED  Erick Colace 06/29/2021, 10:09 AM

## 2021-06-29 NOTE — Progress Notes (Signed)
Pt restless throughout the night, but cooperative.  Speaking a lot more than the previous night, and verbalizing his need more frequently. This am pt very restless and kicking the foot board, and becoming agitated. Gave pt PRN ativan this am.

## 2021-06-30 DIAGNOSIS — G4720 Circadian rhythm sleep disorder, unspecified type: Secondary | ICD-10-CM

## 2021-06-30 DIAGNOSIS — R000 Tachycardia, unspecified: Secondary | ICD-10-CM

## 2021-06-30 LAB — GLUCOSE, CAPILLARY
Glucose-Capillary: 105 mg/dL — ABNORMAL HIGH (ref 70–99)
Glucose-Capillary: 116 mg/dL — ABNORMAL HIGH (ref 70–99)
Glucose-Capillary: 117 mg/dL — ABNORMAL HIGH (ref 70–99)
Glucose-Capillary: 121 mg/dL — ABNORMAL HIGH (ref 70–99)
Glucose-Capillary: 127 mg/dL — ABNORMAL HIGH (ref 70–99)
Glucose-Capillary: 130 mg/dL — ABNORMAL HIGH (ref 70–99)

## 2021-06-30 LAB — CBC
HCT: 37.9 % — ABNORMAL LOW (ref 39.0–52.0)
Hemoglobin: 12.2 g/dL — ABNORMAL LOW (ref 13.0–17.0)
MCH: 28.6 pg (ref 26.0–34.0)
MCHC: 32.2 g/dL (ref 30.0–36.0)
MCV: 89 fL (ref 80.0–100.0)
Platelets: 445 10*3/uL — ABNORMAL HIGH (ref 150–400)
RBC: 4.26 MIL/uL (ref 4.22–5.81)
RDW: 13.7 % (ref 11.5–15.5)
WBC: 13.6 10*3/uL — ABNORMAL HIGH (ref 4.0–10.5)
nRBC: 0 % (ref 0.0–0.2)

## 2021-06-30 LAB — BASIC METABOLIC PANEL
Anion gap: 13 (ref 5–15)
BUN: 44 mg/dL — ABNORMAL HIGH (ref 6–20)
CO2: 16 mmol/L — ABNORMAL LOW (ref 22–32)
Calcium: 9.3 mg/dL (ref 8.9–10.3)
Chloride: 106 mmol/L (ref 98–111)
Creatinine, Ser: 0.8 mg/dL (ref 0.61–1.24)
GFR, Estimated: >60 mL/min (ref >60)
Glucose, Bld: 112 mg/dL — ABNORMAL HIGH (ref 70–99)
Potassium: 4.1 mmol/L (ref 3.5–5.1)
Sodium: 135 mmol/L (ref 135–145)

## 2021-06-30 MED ORDER — QUETIAPINE FUMARATE 25 MG PO TABS: 25.0000 mg | ORAL_TABLET | Freq: Two times a day (BID) | ORAL | Status: DC | PRN

## 2021-06-30 MED ORDER — LORAZEPAM 2 MG/ML IJ SOLN: 0.5000 mg | Freq: Three times a day (TID) | INTRAMUSCULAR | Status: DC | PRN

## 2021-06-30 MED ORDER — FREE WATER
250.0000 mL | Freq: Four times a day (QID) | Status: DC
  Administered 2021-06-30 – 2021-07-01 (×4): 250 mL

## 2021-06-30 MED ORDER — QUETIAPINE FUMARATE 25 MG PO TABS
25.0000 mg | ORAL_TABLET | Freq: Two times a day (BID) | ORAL | Status: DC
  Administered 2021-06-30 – 2021-07-02 (×4): 25 mg
  Filled 2021-06-30 (×4): qty 1

## 2021-06-30 NOTE — Progress Notes (Signed)
**  Late Entry**   06/28/21 1330  What Happened  Was fall witnessed? No  Was patient injured? No  Patient found on floor  Found by Staff-comment (bed alarm went off)  Stated prior activity other (comment) (patient sllid down in the bed)  Follow Up  MD notified Kirstein MD  Time MD notified 1318  Family notified Yes - comment (Nicole-mother)  Time family notified 1338  Additional tests No  Simple treatment Other (comment) (placed back in the bed)  Progress note created (see row info) Yes  Adult Fall Risk Assessment  Risk Factor Category (scoring not indicated) Not Applicable  Age 20  Fall History: Fall within 6 months prior to admission 0  Elimination; Bowel and/or Urine Incontinence 2  Elimination; Bowel and/or Urine Urgency/Frequency 0  Medications: includes PCA/Opiates, Anti-convulsants, Anti-hypertensives, Diuretics, Hypnotics, Laxatives, Sedatives, and Psychotropics 5  Patient Care Equipment 2  Mobility-Assistance 2  Mobility-Gait 2  Mobility-Sensory Deficit 2  Altered awareness of immediate physical environment 1  Impulsiveness 2  Lack of understanding of one's physical/cognitive limitations 4  Total Score 22  Patient Fall Risk Level High fall risk  Adult Fall Risk Interventions  Required Bundle Interventions *See Row Information* High fall risk - low, moderate, and high requirements implemented  Additional Interventions Camera surveillance (with patient/family notification & education);Family Supervision;Lap belt while in chair/wheelchair (Rehab only);Pharmacy review of medications;PT/OT need assessed if change in mobility from baseline;Room near nurses station;Use of appropriate toileting equipment (bedpan, BSC, etc.)  Screening for Fall Injury Risk (To be completed on HIGH fall risk patients) - Assessing Need for Floor Mats  Risk For Fall Injury- Criteria for Floor Mats TeleSitter camera in use;Confusion/dementia (+NuDESC, CIWA, TBI, etc.);Noncompliant with safety  precautions  Will Implement Floor Mats Yes  Pain Assessment  Pain Scale Faces  Pain Score 0  Neurological  Neuro (WDL) X  Level of Consciousness Alert  Orientation Level Oriented to person  Cognition Impulsive;Poor safety awareness  Speech Incomprehensible  Pupil Assessment  No  R Hand Grip Weak  L Hand Grip Weak   RUE Motor Response Purposeful movement  RUE Motor Strength 4  LUE Motor Response Purposeful movement  LUE Motor Strength 4  RLE Motor Response Purposeful movement  RLE Motor Strength 4  LLE Motor Response Purposeful movement  LLE Motor Strength 4  Neuro Symptoms Agitation  Neuro symptoms relieved by Rest;Relaxation techniques (Comment)  Musculoskeletal  Musculoskeletal (WDL) X  Assistive Device Wheelchair;MaxiMove  Generalized Weakness Yes  Weight Bearing Restrictions No  RLE Weight Bearing NWB  Integumentary  Integumentary (WDL) X  Skin Integrity Abrasion;Ecchymosis;Rash  Abrasion Location Lip  Abrasion Location Orientation Lower;Mid  Abrasion Intervention Other (Comment) (assessed)  Catheter Entry/Exit Location Nose;Penis  Catheter Entry/Exit Location Orientation Right  Catheter Entry/Exit Intervention Other (Comment) (assessed)  Ecchymosis Location Leg  Ecchymosis Location Orientation Bilateral;Proximal  Ecchymosis Intervention Other (Comment) (assessed)  Rash Location Arm  Rash Location Orientation Bilateral  Rash Intervention Other (Comment) (assessed)  Skin Turgor Non-tenting

## 2021-06-30 NOTE — Plan of Care (Addendum)
Behavioral Plan: Gabe/G  Rancho Level: V-VI  Behavior to decrease/ eliminate:  -fall risk  Changes to environment:  - Lights on/blinds up during the day to promote sleep/wake schedule - Music on if needed- NO christmas music. Prefers rap    Interventions: - Set schedule for therapy  - Oral care 4x/day  -parents cleared for transfers bed<w/c or BSC and standing EOB for voiding with urinal  Recommendations for interactions with patient: - Provide orientation  - Encourage verbalization  - Clear, simple directions    Attendees: Jake Shark, OT Feliberto Gottron, SLP Serina Cowper, PT Luevenia Maxin, LPN

## 2021-06-30 NOTE — Progress Notes (Signed)
PROGRESS NOTE   Subjective/Complaints: Had a much better night. Awake and alert, answering questions. Family at bedside.   ROS: Limited due to cognitive/behavioral    Objective:   No results found. Recent Labs    06/28/21 0501 06/30/21 0611  WBC 15.2* 13.6*  HGB 12.4* 12.2*  HCT 39.4 37.9*  PLT 553* 445*   Recent Labs    06/28/21 0501 06/30/21 0611  NA 143 135  K 4.3 4.1  CL 114* 106  CO2 20* 16*  GLUCOSE 114* 112*  BUN 43* 44*  CREATININE 0.88 0.80  CALCIUM 9.5 9.3    Intake/Output Summary (Last 24 hours) at 06/30/2021 1119 Last data filed at 06/30/2021 0947 Gross per 24 hour  Intake --  Output 1800 ml  Net -1800 ml     Pressure Injury 06/22/21 Ear Right Stage 2 -  Partial thickness loss of dermis presenting as a shallow open injury with a red, pink wound bed without slough. Pressure injury on cartilage of right ear from head laying on right side (Active)  06/22/21 0130  Location: Ear  Location Orientation: Right  Staging: Stage 2 -  Partial thickness loss of dermis presenting as a shallow open injury with a red, pink wound bed without slough.  Wound Description (Comments): Pressure injury on cartilage of right ear from head laying on right side  Present on Admission: No    Physical Exam: Vital Signs Blood pressure 116/80, pulse 90, temperature (!) 97.5 F (36.4 C), temperature source Oral, resp. rate 18, height 5\' 10"  (1.778 m), weight 77 kg, SpO2 90 %. Constitutional: No distress . Vital signs reviewed. Foley in place HEENT: NCAT, EOMI, oral membranes moist, NGT Neck: supple Cardiovascular: RRR without murmur. No JVD    Respiratory/Chest: CTA Bilaterally without wheezes or rales. Normal effort    GI/Abdomen: BS +, non-tender, non-distended Ext: no clubbing, cyanosis, or edema Psych: pleasant and cooperative  Skin: scattered bruises and abrasions Neurologic: Right complete cranial nerve III  palsy, motor strength is grossly 4/5 in bilateral deltoid, bicep, tricep, grip, hip flexor, knee extensors, ankle dorsiflexor and plantar flexor. Moves all four No evidence of agitation, calm and cooperative with donning of mitts Sensory exam patient cannot cooperate with sensory examination. Musculoskeletal: Full passive range of motion in all 4 extremities. No joint swelling   Assessment/Plan: 1. Functional deficits which require 3+ hours per day of interdisciplinary therapy in a comprehensive inpatient rehab setting. Physiatrist is providing close team supervision and 24 hour management of active medical problems listed below. Physiatrist and rehab team continue to assess barriers to discharge/monitor patient progress toward functional and medical goals  Care Tool:  Bathing        Body parts bathed by helper: Right arm, Left arm, Chest, Abdomen, Front perineal area, Buttocks, Right upper leg, Left upper leg, Right lower leg, Left lower leg, Face     Bathing assist Assist Level: Dependent - Patient 0%     Upper Body Dressing/Undressing Upper body dressing   What is the patient wearing?: Hospital gown only    Upper body assist Assist Level: Moderate Assistance - Patient 50 - 74%    Lower Body Dressing/Undressing Lower body  dressing      What is the patient wearing?: Incontinence brief     Lower body assist Assist for lower body dressing: Moderate Assistance - Patient 50 - 74%     Toileting Toileting    Toileting assist Assist for toileting: Total Assistance - Patient < 25%     Transfers Chair/bed transfer  Transfers assist  Chair/bed transfer activity did not occur: Safety/medical concerns  Chair/bed transfer assist level: 2 Helpers     Locomotion Ambulation   Ambulation assist      Assist level: 2 helpers Assistive device: Other (comment) (B UEs on therapist's shoulders with +2 w/c follow) Max distance: 15 ft   Walk 10 feet activity   Assist      Assist level: 2 helpers Assistive device: Other (comment) (B UEs on therapist's shoulders with +2 w/c follow)   Walk 50 feet activity   Assist Walk 50 feet with 2 turns activity did not occur: Safety/medical concerns         Walk 150 feet activity   Assist Walk 150 feet activity did not occur: Safety/medical concerns         Walk 10 feet on uneven surface  activity   Assist Walk 10 feet on uneven surfaces activity did not occur: Safety/medical concerns         Wheelchair     Assist Is the patient using a wheelchair?: Yes Type of Wheelchair: Manual Wheelchair activity did not occur: Safety/medical concerns         Wheelchair 50 feet with 2 turns activity    Assist    Wheelchair 50 feet with 2 turns activity did not occur: Safety/medical concerns       Wheelchair 150 feet activity     Assist  Wheelchair 150 feet activity did not occur: Safety/medical concerns       Blood pressure 116/80, pulse 90, temperature (!) 97.5 F (36.4 C), temperature source Oral, resp. rate 18, height 5\' 10"  (1.778 m), weight 77 kg, SpO2 90 %.  Medical Problem List and Plan: 1. 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  BCVI of R-ICA with pseudoaneurysm/Antithrombotics: -DVT/anticoagulation:  Pharmaceutical: Lovenox             -antiplatelet therapy: ASA/Brillinta  3. Pain Management: Gabapentin 300 mg TID w/Tylenol qid.               --oxycodone prn 4. Mood/sleep-wake: Neuropsych/LCSW to follow for evaluation when appropriate.              -continue sleep chart             -contin scheduled trazodone 50mg  qhs scheduled,  klonopin at hs             -hold on stimulant for now given improvement             -antipsychotic agents: seroquel, reduce to bid 5. Neuropsych: This patient is not capable of making decisions on his own behalf. 6. Skin/Wound Care: Routine pressure relief measures.  7. Fluids/Electrolytes/Nutrition: NPO with tube  feeds at 75 cc/hr  --Water flushes 100 cc qid--> increase due to AKI. BUN elevated but stable , has high urine output  12/19-increase h20 flushes to 250cc bid, recheck labs tomorrow 8. Tachycardia: Monitor HR TID--continue Metoprolol bid              Vitals:   06/30/21 0341 06/30/21 0817  BP: 123/75 116/80  Pulse: (!) 50 90  Resp: 18   Temp: (!) 97.5 F (36.4 C)   SpO2: 90%     9. Acute blood loss anemia: improved Hgb stable >12 10. Urinary retention: Continue foley for now.             --Started on Silodosin on 12/16.   12/19 dc foley today---voiding trial 11. Hypernatremia w/AKI: Improved will increase water flushes to 200 cc qid. BUN still up with              -UO ~2L 12. Leukocytosis: Monitor for signs of infection.  13. Bouts of agitation: Seroquel and Klonopin being weaned off. Mental status improving but somnolent this am .   klonopin at qhs  14. Facial fractures: Follow up with Dr. Annalee Genta on outpatient basis. 15. Possible low grade aortic injury: Follow up with Dr. Lenell Antu in the future 16. 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              17,  Dysphagia- hopefully can repeat MBS this week  LOS: 3 days A FACE TO FACE EVALUATION WAS PERFORMED  Ranelle Oyster 06/30/2021, 11:19 AM

## 2021-06-30 NOTE — Care Management (Signed)
[facility]patient Rehabilitation Center Individual Statement of Services  Patient Name:  Jason [name]  Date:  06/30/2021  Welcome to the Inpatient Rehabilitation Center.  Our goal is to provide you with an individualized program based on your diagnosis and situation, designed to meet your specific needs.  With this comprehensive rehabilitation program, you will be expected to participate in at least 3 hours of rehabilitation therapies Monday-Friday, with modified therapy programming on the weekends.  Your rehabilitation program will include the following services:  Physical Therapy (PT), Occupational Therapy (OT), Speech Therapy (ST), 24 hour per day rehabilitation nursing, Therapeutic Recreaction (TR), Psychology, Neuropsychology, Care Coordinator, Rehabilitation Medicine, Nutrition Services, Pharmacy Services, and Other  Weekly team conferences will be held on Tuesdays to discuss your progress.  Your Inpatient Rehabilitation Care Coordinator will talk with you frequently to get your input and to update you on team discussions.  Team conferences with you and your family in attendance may also be held.  Expected length of stay: 4-4.5 weeks    Overall anticipated outcome: Supervision  Depending on your progress and recovery, your program may change. Your Inpatient Rehabilitation Care Coordinator will coordinate services and will keep you informed of any changes. Your Inpatient Rehabilitation Care Coordinator's name and contact numbers are listed  below.  The following services may also be recommended but are not provided by the Inpatient Rehabilitation Center:  Driving Evaluations Home Health Rehabiltiation Services Outpatient Rehabilitation Services Vocational Rehabilitation   Arrangements will be made to provide these services after discharge if needed.  Arrangements include referral to agencies that provide these services.  Your insurance has been verified to be:  BCBS  Your primary doctor  is:  No PCP  Pertinent information will be shared with your doctor and your insurance company.  Inpatient Rehabilitation Care Coordinator:  Susie Cassette [phone] or [phone]  Information discussed with and copy given to patient by: Gretchen Short, 06/30/2021, 9:31 AM

## 2021-06-30 NOTE — Progress Notes (Signed)
Speech Language Pathology TBI Note  Patient Details  Name: Jason [name] MRN: [id number] Date of Birth: Sep 21, 2000  Today's Date: 06/30/2021 SLP Individual Time: 0815-0900 SLP Individual Time Calculation (min): 45 min  Short Term Goals: Week 1: SLP Short Term Goal 1 (Week 1): Pt will initiate and perform basic level tasks (oral care, brush hair, etc) with max A multimodal cues SLP Short Term Goal 2 (Week 1): Pt will sustain attention to basic, familiar tasks for 3-4 minute intervals with max A multimodal cues SLP Short Term Goal 3 (Week 1): Pt will follow 1-step directions during functional tasks with max A multimodal cues SLP Short Term Goal 4 (Week 1): Pt will answer simple yes/no questions via multimodal communication methods (verbal, gestures, communication board) with 50% accuracy provided max A cues SLP Short Term Goal 5 (Week 1): Patient will elicit intentional vocalizations during structured or non-structured speech tasks with max A verbal/visual/tactile cues SLP Short Term Goal 6 (Week 1): Pt will tolerate ice chips with minimal s/s aspiration provided max A cues for bolus attention/awareness/transit  Skilled Therapeutic Interventions: Skilled treatment session focused on dysphagia and cognitive goals. Upon arrival, patient was awake in bed and greeted this SLP. Patient was independently oriented to place and was asking appropriate questions regarding current situation, etc. SLP facilitated session by providing thorough oral care removing large amounts of dried secretions. Patient's parents present and educated on importance of frequent oral care to lighten bacterial load and overall health of oral mucosa. Both verbalized understanding.  Patient consumed trials of ice chips with minimal overt s/s of aspiration but required Mod-Max verbal cues for attention to bolus and overall swallow initiation. Recommend ongoing trials with SLP. Patient with mild restlessness but easily redirected.  Overall, patient remained alert, calm and cooperative throughout session. Patient left upright in bed with alarm on and all needs within reach. Continue with current plan of care.      Pain No/Denies Pain   Agitated Behavior Scale: TBI Observation Details Observation Environment: Patient's room Start of observation period - Date: 06/30/21 Start of observation period - Time: 0815 End of observation period - Date: 06/30/21 End of observation period - Time: 0900 Agitated Behavior Scale (DO NOT LEAVE BLANKS) Short attention span, easy distractibility, inability to concentrate: Present to a moderate degree Impulsive, impatient, low tolerance for pain or frustration: Present to a slight degree Uncooperative, resistant to care, demanding: Absent Violent and/or threatening violence toward people or property: Absent Explosive and/or unpredictable anger: Absent Rocking, rubbing, moaning, or other self-stimulating behavior: Absent Pulling at tubes, restraints, etc.: Present to a slight degree Wandering from treatment areas: Absent Restlessness, pacing, excessive movement: Present to a slight degree Repetitive behaviors, motor, and/or verbal: Absent Rapid, loud, or excessive talking: Absent Sudden changes of mood: Absent Easily initiated or excessive crying and/or laughter: Absent Self-abusiveness, physical and/or verbal: Absent Agitated behavior scale total score: 19  Therapy/Group: Individual Therapy  Adnan Vanvoorhis 06/30/2021, 12:52 PM

## 2021-06-30 NOTE — Progress Notes (Signed)
Foley catheter removed per MD order. Bloody drainage noted from penis and in catheter bag. Cloudy amber urine noted. No complications noted. Family educated on care after foley removal. Family verbalizes understanding. Mylo Red, LPN

## 2021-06-30 NOTE — Progress Notes (Signed)
Occupational Therapy TBI Note  Patient Details  Name: Jason [name] MRN: [id number] Date of Birth: 2000/11/27  Today's Date: 06/30/2021 OT Individual Time: 0900-1000 OT Individual Time Calculation (min): 60 min    Session 2: Today's Date: 06/30/2021 OT Individual Time: 1415-1500 OT Individual Time Calculation (min): 45 min    Short Term Goals: Week 1:  OT Short Term Goal 1 (Week 1): Pt will maintain static sitting balance EOB with max A of 1 for >5 min in prep for seated ADL. OT Short Term Goal 2 (Week 1): Pt will wash face with min A. OT Short Term Goal 3 (Week 1): Pt will sustain attention on ADL task for >1 min with no more than min VCs. OT Short Term Goal 4 (Week 1): Pt will don shirt with max A.  Skilled Therapeutic Interventions/Progress Updates:    Pt received supine, alert and agreeable to OT session. Parents present throughout session and very supportive. Pt unable to open R eye (CN III injury) and with some over/undershooting with reaching. Pt required mod cueing for technique to transfer to EOB and mod A overall. He was often distracted by foley but no attempts to pull on it. He completed UB bathing with min A, requiring cueing/assist for thoroughness. Sitting balance with overall CGA, occasional posterior and R LOB. Pt with moderate awareness of midline orientation. He completed a stand pivot transfer with mod-max A to the [facility]. Poor spatial awareness with heavy LOB's in standing. He did not void BM but was able to reach around for hygiene with mod A. New brief and pants donned with max a. Transfer to the TIS w/c with max A stand pivot. Pt with shallow sitting and immediate sliding forward in chair, requiring x2 attempts and mod +2 to scoot him back in the chair safely. Pt oriented to year only. Oral care completed with mod A d/t hard deposits on tongue. Pt eventually stated to OT "I want you to stop touching me", OT reinforced that is totally okay to ask and appropriate! Pt was  left sitting up in the w/c with all needs met, tilted back with the chair alarm set. Parents present.   Session 2: Pt received supine, alert, and agreeable to OT session. Pt's parents present and supportive during session. Pt completed transfer to EOB with min cueing for initiation and min A. He required mod A to don socks and shoes. Poor thoroughness. Pt completed a stand pivot transfer with mod A to the TIS w/c. Pt was taken to the therapy gym via TIS w/c. Transfer to EOM with mod A. EOM sitting balance with CGA, occasional LOB to the R. Pt completed functional reaching in closed chain with a large ball. Difficulty following directions and keeping BUE on the ball, requiring mod HOH. He completed core stabilization exercises with a posterior induced LOB. Pt was returned to his room and then to his bed. He was left supine with all needs met, bed alarm set.    Therapy Documentation Precautions:  Precautions Precautions: Fall Precaution Comments: NG tube, foley, HOB >30, NPO, B hand mitts, soft waist belt Restrictions Weight Bearing Restrictions: Yes RLE Weight Bearing: Non weight bearing  Agitated Behavior Scale: TBI Observation Details Observation Environment: pt room Start of observation period - Date: 06/30/21 Start of observation period - Time: 0900 End of observation period - Date: 06/30/21 End of observation period - Time: 1000 Agitated Behavior Scale (DO NOT LEAVE BLANKS) Short attention span, easy distractibility, inability to concentrate: Present to a  moderate degree Impulsive, impatient, low tolerance for pain or frustration: Present to a slight degree Uncooperative, resistant to care, demanding: Absent Violent and/or threatening violence toward people or property: Absent Explosive and/or unpredictable anger: Absent Rocking, rubbing, moaning, or other self-stimulating behavior: Absent Pulling at tubes, restraints, etc.: Present to a slight degree Wandering from treatment  areas: Absent Restlessness, pacing, excessive movement: Absent Repetitive behaviors, motor, and/or verbal: Absent Rapid, loud, or excessive talking: Absent Sudden changes of mood: Absent Easily initiated or excessive crying and/or laughter: Absent Self-abusiveness, physical and/or verbal: Absent Agitated behavior scale total score: 18   Therapy/Group: Individual Therapy  Curtis Sites 06/30/2021, 10:43 AM

## 2021-06-30 NOTE — Progress Notes (Signed)
Pt bladder scan 350. Family refused for patient to be catheterized at this time. Family stated they will help him void and work with him to void. Pt and family educated on importance of intermittent catheterization and UTI.Mylo Red, LPN

## 2021-06-30 NOTE — Progress Notes (Signed)
Physical Therapy TBI Note  Patient Details  Name: Jason [name] MRN: [id number] Date of Birth: 23-Nov-2000  Today's Date: 06/30/2021 PT Individual Time: 1105-1200 PT Individual Time Calculation (min): 55 min   Short Term Goals: Week 1:  PT Short Term Goal 1 (Week 1): Patient will perform bed mobility with min A >50% of the time with or without hospital bed features. PT Short Term Goal 2 (Week 1): Patient will perform basic transfers with min A >50% of the time using LRAD. PT Short Term Goal 3 (Week 1): Patient will ambulate >50 feet using LRAD with mod A.  Skilled Therapeutic Interventions/Progress Updates:     Patient in TIS w/c with his parents in the room upon PT arrival. Patient slow to arouse and agreeable to PT session. Patient denied pain during session.   Focused initial part of session with sustaining arousal and attention on PT in TIS w/c. Patient engaged in reaching task with improved effort from yesterday, reaching cross body and ipsilaterally with B upper extremities. Patient with increased verbalization today, stated his name, and answered simple questions about how he was feeling and if he was having pain. Patient maintained increased fatigue throughout session compared to Saturday. Patient's parents report that he is not sleeping well and is restless at night and that he naps throughout the day, will discuss with rehab team about management of sleep/wake cycle. Spent increased time educating patient and family about general TBI recovery, RLA levels of recovery, ELOS, and PT goals. Patient's parents appreciative and in agreement with present goals and LOS.   Therapeutic Activity: Bed Mobility: Patient returned to supine with max A for safety due to impulsivity and attempting to crawl into bed rather than sit on the bed. Provided verbal cues for safety awareness and controlled descent onto his hands then side. He was able to reposition in the bed with mod cues then performed  scooting up in the bed with total A +2 Transfers: Patient performed sit to/from stand x2 and stand pivot x1 with min-mod A with B hands on therapist's shoulders. Provided verbal cues for initiation and forward weight shift due to mild posterior bias with decreased trunk control in standing on first trial, improved on subsequent trials.  Gait Training:  Patient ambulated ~20 feet x2 using B hands on therapist's shoulders with mod A with his mother as visual target in front of patient and his father providing +2 w/c follow for safety due to patient sitting impulsively. Ambulated with ataxic gait, mild scissoring L>R, decreased gait speed, step length and height L>R, forward trunk flexion, and narrow BOS. Provided verbal cues for erect posture, initiation, and facilitation of forward propulsion and weight shifting.  Patient expressed significant fatigue following second gait trial and returned to bed as above. Attempted to adjust TIS leg rests for improved sitting posture and tolerance. Unable to adjust leg rests at this time, will locate improved fitting equipment for patient prior to next session.   Patient in bed with his parents and CSW in the room at end of session with breaks locked, bed alarm set, and all needs within reach.   Therapy Documentation Precautions:  Precautions Precautions: Fall Precaution Comments: NG tube, foley, HOB >30, NPO, B hand mitts, soft waist belt Restrictions Weight Bearing Restrictions: Yes RLE Weight Bearing: Weight bearing as tolerated Agitated Behavior Scale: TBI Observation Details Observation Environment: CIR Start of observation period - Date: 06/30/21 Start of observation period - Time: 1105 End of observation period - Date: 06/30/21 End  of observation period - Time: 1205 Agitated Behavior Scale (DO NOT LEAVE BLANKS) Short attention span, easy distractibility, inability to concentrate: Present to a moderate degree Impulsive, impatient, low tolerance  for pain or frustration: Absent Uncooperative, resistant to care, demanding: Absent Violent and/or threatening violence toward people or property: Absent Explosive and/or unpredictable anger: Absent Rocking, rubbing, moaning, or other self-stimulating behavior: Absent Pulling at tubes, restraints, etc.: Present to a slight degree Wandering from treatment areas: Absent Restlessness, pacing, excessive movement: Absent Repetitive behaviors, motor, and/or verbal: Absent Rapid, loud, or excessive talking: Absent Sudden changes of mood: Absent Easily initiated or excessive crying and/or laughter: Absent Self-abusiveness, physical and/or verbal: Absent Agitated behavior scale total score: 17    Therapy/Group: Individual Therapy  Vyron Fronczak L Debar Plate PT, DPT  06/30/2021, 4:02 PM

## 2021-06-30 NOTE — Progress Notes (Signed)
Inpatient Rehabilitation  Patient information reviewed and entered into eRehab system by Primus Gritton M. Davien Malone, M.A., CCC/SLP, PPS Coordinator.  Information including medical coding, functional ability and quality indicators will be reviewed and updated through discharge.    

## 2021-07-01 LAB — GLUCOSE, CAPILLARY
Glucose-Capillary: 87 mg/dL (ref 70–99)
Glucose-Capillary: 101 mg/dL — ABNORMAL HIGH (ref 70–99)
Glucose-Capillary: 119 mg/dL — ABNORMAL HIGH (ref 70–99)
Glucose-Capillary: 110 mg/dL — ABNORMAL HIGH (ref 70–99)
Glucose-Capillary: 98 mg/dL (ref 70–99)

## 2021-07-01 LAB — BASIC METABOLIC PANEL
Anion gap: 7 (ref 5–15)
BUN: 39 mg/dL — ABNORMAL HIGH (ref 6–20)
CO2: 22 mmol/L (ref 22–32)
Calcium: 9.5 mg/dL (ref 8.9–10.3)
Chloride: 106 mmol/L (ref 98–111)
Creatinine, Ser: 0.74 mg/dL (ref 0.61–1.24)
GFR, Estimated: >60 mL/min (ref >60)
Glucose, Bld: 96 mg/dL (ref 70–99)
Potassium: 4.3 mmol/L (ref 3.5–5.1)
Sodium: 135 mmol/L (ref 135–145)

## 2021-07-01 MED ORDER — FREE WATER
300.0000 mL | Freq: Four times a day (QID) | Status: DC
  Administered 2021-07-01 – 2021-07-03 (×8): 300 mL

## 2021-07-01 MED ORDER — SODIUM CHLORIDE 0.9 % BOLUS PEDS
500.0000 mL | Freq: Once | INTRAVENOUS | Status: AC
Start: 2021-07-01 — End: 2021-07-01
  Administered 2021-07-01: 10:00:00 500 mL via INTRAVENOUS

## 2021-07-01 MED ORDER — DIPHENOXYLATE-ATROPINE 2.5-0.025 MG/5ML PO LIQD
5.0000 mL | Freq: Four times a day (QID) | ORAL | Status: DC | PRN
  Administered 2021-07-01 (×2): 5 mL via ORAL
  Filled 2021-07-01 (×2): qty 5

## 2021-07-01 MED ORDER — SODIUM CHLORIDE 0.9 % IV SOLN: INTRAVENOUS | Status: AC

## 2021-07-01 NOTE — Significant Event (Signed)
Rapid Response Event Note   Reason for Call :  Pt became pale and clammy while voiding with physical therapy  Initial Focused Assessment:  Pt lying in bed. Alert, answering questions appropriately. He endorses some lightheadedness. No distress. BP improving, 93/53 to 104/65. Skin is warm, dry. Skin color is improving.   VS: BP 104/65, HR 66, RR 14, SpO2 100% on room air CBG: 101  Interventions:  -IV access established -500 CC IVF bolus per MD  Plan of Care:  -Complete set of VS following IVF bolus -Up with 1-2 person assist  Call rapid response for additional needs  Event Summary:  MD Notified: Dr. Riley Kill Call Time: [phone] Arrival Time: 3825 End Time: 1020  Jennye Moccasin, RN

## 2021-07-01 NOTE — Progress Notes (Signed)
Patient ID: Jason [name], male   DOB: September 18, 2000, 20 y.o.   MRN: [id number]  SW met with pt and pt parents to provide update from team conference, and d/c date 07/26/21. SW informed will follow-up with physician about FMLA forms needed.   Loralee Pacas, MSW, [place] Office: [phone] Cell: [phone] Fax: [phone]

## 2021-07-01 NOTE — Progress Notes (Signed)
Physical Therapy TBI Note  Patient Details  Name: Jason [name] MRN: [id number] Date of Birth: 2000-12-23  Today's Date: 07/01/2021 PT Individual Time: 1400-1455 PT Individual Time Calculation (min): 55 min   Short Term Goals: Week 1:  PT Short Term Goal 1 (Week 1): Patient will perform bed mobility with min A >50% of the time with or without hospital bed features. PT Short Term Goal 2 (Week 1): Patient will perform basic transfers with min A >50% of the time using LRAD. PT Short Term Goal 3 (Week 1): Patient will ambulate >50 feet using LRAD with mod A.  Skilled Therapeutic Interventions/Progress Updates:     Patient in TIS w/c with is parents present upon PT arrival. Patient alert and agreeable to PT session. Patient reported 8/10 headache pain at end of session, RN made aware. PT provided repositioning, rest breaks, and distraction as pain interventions throughout session. Patient and family reported they would prefer if the patient did not take opoid pain medicine unless absolutely necessary, RN made aware.  Patient recounted episode during voiding this morning where he became non-responsive. Educated patient and family on hypotension and interventions with IV fluids. MD came by during session and informed the patient that he would be receiving more IV fluids as well. Patient limited with mobility during session due to sudden onset of nausea and "wooziness" in standing x3 trials. Focused session on transfer training, sitting balance, and hand-eye coordination with ball toss activity. Patient oriented to self, location, and situation, unable to identify the month or year with max cues. PT provided orientation to time during session. TIS leg rests adjusted for improved fit and sitting posture and tolerance during session. Patient also reported feeling "hungry" several times at beginning of session. Patient's family had had patient out of the room for a few hours prior to session and patient has  been off tube feeds. LPN hooked up tube feeds and they were running during session and paused for mobility for safety.   Therapeutic Activity: Bed Mobility: Patient performed sit to supine with supervision x2 and supine to sit with min A due to first attempt at lying being impulsive and resulting in poor positioning in the bed. Provided verbal cues for impulsivity, safety awareness with NG tube, and positioning. Patient performed scooting up in bed x1 after scooting down, despite cues for scooting up x2. Eventually performed scooting up in bed with total A +2 for improved positioning with NG tube. Patient with mild display of limited frustration tolerance with max cues to scoot up in bed, reporting, "my mom usually helps me." PT educated on benefits of increased independence with mobility and reduced caregiver burden. Will continue to reinforce with patient and family.  Transfers: Patient performed sit to/from stand x3, lateral scoot TIS<>mat table, and stand pivot TIS>bed with min A. Provided verbal cues for initiation, sequencing with demonstration, and forward weight shift to stand.  Neuromuscular Re-ed: -seated in TIS patient performed ball toss 2x2 min with 4 missed catches with low or R toss -seated EOM x5 min performed ball toss with supervision for sitting balance with 3 missed catches with low or R toss, improved engagement and trunk mobility in unsupported sitting -standing balance 2x30 sec focused on reduced upper extremity support and use of ankle strategies to reduce posterior lean, progressed from min A to CGA  Patient in bed with his parents at bedside at end of session with breaks locked, bed alarm set, and all needs within reach.   Therapy Documentation  Precautions:  Precautions Precautions: Fall Precaution Comments: NG tube, foley, HOB >30, NPO, B hand mitts, soft waist belt Restrictions Weight Bearing Restrictions: Yes RLE Weight Bearing: Weight bearing as tolerated  Agitated  Behavior Scale: TBI Observation Details Observation Environment: CIR Start of observation period - Date: 07/01/21 Start of observation period - Time: 1400 End of observation period - Date: 07/01/21 End of observation period - Time: 1455 Agitated Behavior Scale (DO NOT LEAVE BLANKS) Short attention span, easy distractibility, inability to concentrate: Present to a slight degree Impulsive, impatient, low tolerance for pain or frustration: Present to a slight degree Uncooperative, resistant to care, demanding: Absent Violent and/or threatening violence toward people or property: Absent Explosive and/or unpredictable anger: Absent Rocking, rubbing, moaning, or other self-stimulating behavior: Absent Pulling at tubes, restraints, etc.: Absent Wandering from treatment areas: Absent Restlessness, pacing, excessive movement: Absent Repetitive behaviors, motor, and/or verbal: Present to a slight degree Rapid, loud, or excessive talking: Absent Sudden changes of mood: Absent Easily initiated or excessive crying and/or laughter: Absent Self-abusiveness, physical and/or verbal: Absent Agitated behavior scale total score: 17     Therapy/Group: Individual Therapy  Jason [name] PT, DPT  07/01/2021, 4:12 PM

## 2021-07-01 NOTE — Progress Notes (Signed)
Speech Language Pathology TBI Note  Patient Details  Name: Jason [name] MRN: [id number] Date of Birth: 2000-12-30  Today's Date: 07/01/2021 SLP Individual Time: 0815-0900 SLP Individual Time Calculation (min): 45 min  Short Term Goals: Week 1: SLP Short Term Goal 1 (Week 1): Pt will initiate and perform basic level tasks (oral care, brush hair, etc) with max A multimodal cues SLP Short Term Goal 2 (Week 1): Pt will sustain attention to basic, familiar tasks for 3-4 minute intervals with max A multimodal cues SLP Short Term Goal 3 (Week 1): Pt will follow 1-step directions during functional tasks with max A multimodal cues SLP Short Term Goal 4 (Week 1): Pt will answer simple yes/no questions via multimodal communication methods (verbal, gestures, communication board) with 50% accuracy provided max A cues SLP Short Term Goal 5 (Week 1): Patient will elicit intentional vocalizations during structured or non-structured speech tasks with max A verbal/visual/tactile cues SLP Short Term Goal 6 (Week 1): Pt will tolerate ice chips with minimal s/s aspiration provided max A cues for bolus attention/awareness/transit  Skilled Therapeutic Interventions: Skilled treatment session focused on dysphagia and cognitive goals. Upon arrival, patient was awake in bed and greeted this SLP. Patient was independently oriented to place and situation and required Min verbal cues to utilize a calendar for orientation to date.  SLP facilitated session by providing thorough oral care removing large amounts of dried secretions.  Patient self-fed ice chips via spoon with Min verbal cues for problem solving. Patient demonstrated a more timely swallow initiation but continues to "slurp" while masticating ice chips. No overt s/s of aspiration noted. Patient also consumed small sips of nectar-thick liquids via spoon and demonstrated an intermittent wet vocal quality that cleared with a cued throat clear. Recommend ongoing trials  with SLP. SLP also facilitated session by initiating the [facility] Mental Status Examination (SLUMS). Patient attempted the visual-perception portions of the test with visual deficits evident.  Overall, patient remained alert, calm and cooperative throughout session. Patient left upright in bed with alarm on, parents present, and all needs within reach. Continue with current plan of care.         Pain Pain Assessment Pain Scale: 0-10 Pain Score: 0-No pain  Agitated Behavior Scale: TBI Observation Details Observation Environment: Patient's room Start of observation period - Date: 07/01/21 Start of observation period - Time: 0815 End of observation period - Date: 07/01/21 End of observation period - Time: 0900 Agitated Behavior Scale (DO NOT LEAVE BLANKS) Short attention span, easy distractibility, inability to concentrate: Present to a moderate degree Impulsive, impatient, low tolerance for pain or frustration: Absent Uncooperative, resistant to care, demanding: Absent Violent and/or threatening violence toward people or property: Absent Explosive and/or unpredictable anger: Absent Rocking, rubbing, moaning, or other self-stimulating behavior: Absent Pulling at tubes, restraints, etc.: Present to a slight degree Wandering from treatment areas: Absent Restlessness, pacing, excessive movement: Absent Repetitive behaviors, motor, and/or verbal: Absent Rapid, loud, or excessive talking: Absent Sudden changes of mood: Absent Easily initiated or excessive crying and/or laughter: Absent Self-abusiveness, physical and/or verbal: Absent Agitated behavior scale total score: 17  Therapy/Group: Individual Therapy  Jason [name] 07/01/2021, 10:41 AM

## 2021-07-01 NOTE — Progress Notes (Addendum)
°   07/01/21 0952  Vitals  BP (!) 93/53  MAP (mmHg) 67  BP Location Right Arm  BP Method Automatic  Patient Position (if appropriate) Lying  Pulse Rate 67  Pulse Rate Source Dinamap  MEWS COLOR  MEWS Score Color Green  Oxygen Therapy  SpO2 100 %  O2 Device Room Air  MEWS Score  MEWS Temp 0  MEWS Systolic 1  MEWS Pulse 0  MEWS RR 0  MEWS LOC 0  MEWS Score 1   Duress alert to pt room, pt unresponsive pale and clammy. Pt placed in trendelenburg, vitals obtained, rapid response arrived at bedside.Pt became arousable. Pt a+ox3. MD, rapid response, and PA at bedside New orders for NaCl bolus. IV inserted to left FA and fluids started. (See MAR) Vitals reobtained   0954: BP 104/65, HR 66 0959: BP 104/68, HR 75 1010: BP 105/74, HR 77

## 2021-07-01 NOTE — Plan of Care (Signed)
°  Problem: RH Cognition - SLP Goal: RH LTG Patient will demonstrate orientation with cues Description:  LTG:  Patient will demonstrate orientation to person/place/time/situation with cues (SLP)   Flowsheets (Taken 07/01/2021 0620) LTG: Patient will demonstrate orientation using cueing (SLP): Supervision Note: Goal added due to progress   Problem: RH Problem Solving Goal: LTG Patient will demonstrate problem solving for (SLP) Description: LTG:  Patient will demonstrate problem solving for basic/complex daily situations with cues  (SLP) Flowsheets (Taken 07/01/2021 0620) LTG: Patient will demonstrate problem solving for (SLP): Complex daily situations LTG Patient will demonstrate problem solving for: Minimal Assistance - Patient > 75% Note: Goal added due to progress   Problem: RH Memory Goal: LTG Patient will use memory compensatory aids to (SLP) Description: LTG:  Patient will use memory compensatory aids to recall biographical/new, daily complex information with cues (SLP) Flowsheets (Taken 07/01/2021 0620) LTG: Patient will use memory compensatory aids to (SLP): Minimal Assistance - Patient > 75% Note: Goal added due to progress   Problem: RH Awareness Goal: LTG: Patient will demonstrate awareness during functional activites type of (SLP) Description: LTG: Patient will demonstrate awareness during functional activites type of (SLP) Flowsheets (Taken 07/01/2021 0620) Patient will demonstrate during cognitive/linguistic activities awareness type of: Anticipatory LTG: Patient will demonstrate awareness during cognitive/linguistic activities with assistance of (SLP): Minimal Assistance - Patient > 75% Note: Goal added due to progress   Problem: RH Comprehension Communication Goal: LTG Patient will comprehend basic/complex auditory (SLP) Description: LTG: Patient will comprehend basic/complex auditory information with cues (SLP). 07/01/2021 0620 by Huston Foley, CCC-SLP Outcome:  Not Applicable Note: Goal no longer appropriate due to progress  07/01/2021 0620 by Huston Foley, CCC-SLP Outcome: Not Applicable   Problem: RH Expression Communication Goal: LTG Patient will express needs/wants via multi-modal(SLP) Description: LTG:  Patient will express needs/wants via multi-modal communication (gestures/written, etc) with cues (SLP) 07/01/2021 0620 by Huston Foley, CCC-SLP Outcome: Not Applicable Note: Goal no longer appropriate due to progress  07/01/2021 0620 by Huston Foley, CCC-SLP Outcome: Not Applicable Goal: LTG Patient will verbally express basic/complex needs(SLP) Description: LTG:  Patient will verbally express basic/complex needs, wants or ideas with cues  (SLP) 07/01/2021 0620 by Huston Foley, CCC-SLP Outcome: Not Applicable Note: Goal no longer appropriate due to progress  07/01/2021 0620 by Huston Foley, CCC-SLP Outcome: Not Applicable

## 2021-07-01 NOTE — Progress Notes (Signed)
Inpatient Rehabilitation Care Coordinator Assessment and Plan Patient Details  Name: Caedyn Raygoza MRN: [id number] Date of Birth: 04-26-2001  Today's Date: 07/01/2021  Hospital Problems: Principal Problem:   TBI (traumatic brain injury)  Past Medical History: History reviewed. No pertinent past medical history. Past Surgical History:  Past Surgical History:  Procedure Laterality Date   IR ANGIO EXTRACRAN SEL COM CAROTID INNOMINATE UNI BILAT MOD SED  06/08/2021   IR ANGIO INTRA EXTRACRAN SEL INTERNAL CAROTID UNI R MOD SED  06/12/2021   IR ANGIO INTRA EXTRACRAN SEL INTERNAL CAROTID UNI R MOD SED  06/18/2021   IR ANGIO VERTEBRAL SEL VERTEBRAL BILAT MOD SED  06/08/2021   IR CT HEAD LTD  06/12/2021   IR CT HEAD LTD  06/18/2021   IR TRANSCATH/EMBOLIZ  06/12/2021   IR TRANSCATH/EMBOLIZ  06/18/2021   IR US GUIDE VASC ACCESS RIGHT  06/18/2021   RADIOLOGY WITH ANESTHESIA N/A 06/08/2021   Procedure: IR WITH ANESTHESIA;  Surgeon: Luanne Bras, MD;  Location: [place];  Service: Radiology;  Laterality: N/A;   RADIOLOGY WITH ANESTHESIA N/A 06/12/2021   Procedure: Starr Sinclair;  Surgeon: Luanne Bras, MD;  Location: [place];  Service: Radiology;  Laterality: N/A;   RADIOLOGY WITH ANESTHESIA N/A 06/18/2021   Procedure: EMBOLIZATION;  Surgeon: Luanne Bras, MD;  Location: [place];  Service: Radiology;  Laterality: N/A;   Social History:  reports that he has an unknown smoking status. He has never used smokeless tobacco. He reports current alcohol use. He reports that he does not use drugs.  Family / Support Systems Marital Status: Single Spouse/Significant Other: girlfriend (passed at seen) Children: None Other Supports: N/A Anticipated Caregiver: Parents Ability/Limitations of Caregiver: None reported Caregiver Availability: 24/7 Family Dynamics: Pt was attending [employer] and living with roommates.  Social History Preferred language: English Religion: Christian Cultural Background: Pt  was in 50rd yr in college Education: 3rd yr college Health Literacy - How often do you need to have someone help you when you read instructions, pamphlets, or other written material from your doctor or pharmacy?: Never Writes: Yes Employment Status: Unemployed Date Retired/Disabled/Unemployed: N/A Public relations account executive Issues: Denies Guardian/Conservator: N/A   Abuse/Neglect Abuse/Neglect Assessment Can Be Completed: Unable to assess, patient is non-responsive or altered mental status  Patient response to: Social Isolation - How often do you feel lonely or isolated from those around you?: Never  Emotional Status Pt's affect, behavior and adjustment status: Pt in good spirits at time of visit, despite some agitation with catheter. Recent Psychosocial Issues: Denies Psychiatric History: Denies Substance Abuse History: PT denies [facility], however mom indicates such. Pt admits to marijuana use.  Patient / Family Perceptions, Expectations & Goals Pt/Family understanding of illness & functional limitations: Pt parents have a general understanding of pt care needs Premorbid pt/family roles/activities: Independent Anticipated changes in roles/activities/participation: Assistance with ADLs/IADLs Pt/family expectations/goals: Pt goal is to "be able to stand...that would be be pretty cool." Parents goal "get back to where he started before he got here- mentally, physically, and emotionally."  [employer]: None Premorbid Home Care/DME Agencies: None Transportation available at discharge: parents Is the patient able to respond to transportation needs?: Yes In the past 12 months, has lack of transportation kept you from medical appointments or from getting medications?: No In the past 12 months, has lack of transportation kept you from meetings, work, or from getting things needed for daily living?: No Resource referrals recommended: Neuropsychology  Discharge  Planning Living Arrangements: Parent Support Systems: Parent Type  of Residence: Private residence [employer]: Multimedia programmer (specify) Nurse, mental health) Financial Resources: Family Support Financial Screen Referred: No Living Expenses: Lives with family Money Management: Patient Does the patient have any problems obtaining your medications?: No Home Management: Pt managed home care needs with roommates Patient/Family Preliminary Plans: Parents to provide care needs Care Coordinator Barriers to Discharge: Decreased caregiver support, Lack of/limited family support Care Coordinator Anticipated Follow Up Needs: HH/OP Expected length of stay: 4-4.5 weeks  Clinical Impression SW met with pt and pt parents in room to introduce self, explain role, and discuss discharge process. Pt is not a English as a second language teacher. No HCPOA. No DME.   Arin Vanosdol A Kasiah Manka 07/01/2021, 11:17 PM

## 2021-07-01 NOTE — Progress Notes (Incomplete)
Patient ID: Jason [name], male   DOB: 2000/12/29, 20 y.o.   MRN: [id number]    Cecile Sheerer, MSW, LCSWA Office: [phone] Cell: [phone] Fax: [phone]

## 2021-07-01 NOTE — Progress Notes (Addendum)
PROGRESS NOTE   Subjective/Complaints: Had a much better night. Awake and alert, answering questions. Family at bedside.   ROS: Limited due to cognitive/behavioral    Objective:   No results found. Recent Labs    06/30/21 0611  WBC 13.6*  HGB 12.2*  HCT 37.9*  PLT 445*   Recent Labs    06/30/21 0611  NA 135  K 4.1  CL 106  CO2 16*  GLUCOSE 112*  BUN 44*  CREATININE 0.80  CALCIUM 9.3    Intake/Output Summary (Last 24 hours) at 07/01/2021 1001 Last data filed at 07/01/2021 0345 Gross per 24 hour  Intake --  Output 2050 ml  Net -2050 ml     Pressure Injury 06/22/21 Ear Right Stage 2 -  Partial thickness loss of dermis presenting as a shallow open injury with a red, pink wound bed without slough. Pressure injury on cartilage of right ear from head laying on right side (Active)  06/22/21 0130  Location: Ear  Location Orientation: Right  Staging: Stage 2 -  Partial thickness loss of dermis presenting as a shallow open injury with a red, pink wound bed without slough.  Wound Description (Comments): Pressure injury on cartilage of right ear from head laying on right side  Present on Admission: No    Physical Exam: Vital Signs Blood pressure 104/68, pulse 75, temperature 97.7 F (36.5 C), resp. rate 18, height 5\' 10"  (1.778 m), weight 77 kg, SpO2 100 %. Constitutional: No distress . Vital signs reviewed. Foley in place HEENT: NCAT, EOMI, oral membranes moist, NGT Neck: supple Cardiovascular: RRR without murmur. No JVD    Respiratory/Chest: CTA Bilaterally without wheezes or rales. Normal effort    GI/Abdomen: BS +, non-tender, non-distended Ext: no clubbing, cyanosis, or edema Psych: pleasant and cooperative  Skin: scattered bruises and abrasions Neurologic: Right complete cranial nerve III palsy, motor strength is grossly 4/5 in bilateral deltoid, bicep, tricep, grip, hip flexor, knee extensors, ankle  dorsiflexor and plantar flexor. Moves all four No evidence of agitation, calm and cooperative with donning of mitts Sensory exam patient cannot cooperate with sensory examination. Musculoskeletal: Full passive range of motion in all 4 extremities. No joint swelling   Assessment/Plan: 1. Functional deficits which require 3+ hours per day of interdisciplinary therapy in a comprehensive inpatient rehab setting. Physiatrist is providing close team supervision and 24 hour management of active medical problems listed below. Physiatrist and rehab team continue to assess barriers to discharge/monitor patient progress toward functional and medical goals  Care Tool:  Bathing        Body parts bathed by helper: Right arm, Left arm, Chest, Abdomen, Front perineal area, Buttocks, Right upper leg, Left upper leg, Right lower leg, Left lower leg, Face     Bathing assist Assist Level: Dependent - Patient 0%     Upper Body Dressing/Undressing Upper body dressing   What is the patient wearing?: Hospital gown only    Upper body assist Assist Level: Moderate Assistance - Patient 50 - 74%    Lower Body Dressing/Undressing Lower body dressing      What is the patient wearing?: Incontinence brief     Lower body assist  Assist for lower body dressing: Moderate Assistance - Patient 50 - 74%     Toileting Toileting    Toileting assist Assist for toileting: Total Assistance - Patient < 25%     Transfers Chair/bed transfer  Transfers assist  Chair/bed transfer activity did not occur: Safety/medical concerns  Chair/bed transfer assist level: 2 Helpers     Locomotion Ambulation   Ambulation assist      Assist level: 2 helpers Assistive device: Other (comment) (B UEs on therapist's shoulders with +2 w/c follow) Max distance: 15 ft   Walk 10 feet activity   Assist     Assist level: 2 helpers Assistive device: Other (comment) (B UEs on therapist's shoulders with +2 w/c follow)    Walk 50 feet activity   Assist Walk 50 feet with 2 turns activity did not occur: Safety/medical concerns         Walk 150 feet activity   Assist Walk 150 feet activity did not occur: Safety/medical concerns         Walk 10 feet on uneven surface  activity   Assist Walk 10 feet on uneven surfaces activity did not occur: Safety/medical concerns         Wheelchair     Assist Is the patient using a wheelchair?: Yes Type of Wheelchair: Manual Wheelchair activity did not occur: Safety/medical concerns         Wheelchair 50 feet with 2 turns activity    Assist    Wheelchair 50 feet with 2 turns activity did not occur: Safety/medical concerns       Wheelchair 150 feet activity     Assist  Wheelchair 150 feet activity did not occur: Safety/medical concerns       Blood pressure 104/68, pulse 75, temperature 97.7 F (36.5 C), resp. rate 18, height 5\' 10"  (1.778 m), weight 77 kg, SpO2 100 %.  Medical Problem List and Plan: 1. 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 Interdisciplinary team conference today to discuss goals, barriers to discharge, and dc planning.   2.  BCVI of R-ICA with pseudoaneurysm/Antithrombotics: -DVT/anticoagulation:  Pharmaceutical: Lovenox             -antiplatelet therapy: ASA/Brillinta  3. Pain Management: Gabapentin 300 mg TID w/Tylenol qid.               --oxycodone prn 4. Mood/sleep-wake: Neuropsych/LCSW to follow for evaluation when appropriate.              -continue sleep chart             -contin scheduled trazodone 50mg  qhs scheduled, klonopin at hs              -hold on stimulant for now given improvement             -antipsychotic agents: seroquel, reduce to bid 5. Neuropsych: This patient is not capable of making decisions on his own behalf. 6. Skin/Wound Care: Routine pressure relief measures.  7. Fluids/Electrolytes/Nutrition: NPO with tube feeds at 75 cc/hr  --Water flushes  100 cc qid--> increase due to AKI. BUN elevated but stable , has high urine output  12/20 increase h20 flushes to 300cc q6, give bolus of NS  -check labs at 1300 8. Tachycardia: Monitor HR TID--continue Metoprolol bid              Vitals:   07/01/21 0954 07/01/21 0959  BP: 104/65 104/68  Pulse: 66 75  Resp:    Temp:    SpO2:      9. Acute blood loss anemia: improved Hgb stable >12 10. Urinary retention:              --Started on Silodosin on 12/16.--dc d/t syncopal episode   12/19 dc'ed foley today---continue   voiding trial 11. Hypernatremia w/AKI: Improved will increase water flushes to 200 cc qid. BUN still up with              -see #7 12. Leukocytosis: gradually improving  13. Bouts of agitation: Seroquel and Klonopin being weaned off. Mental status improving but somnolent this am .   klonopin at qhs  14. Facial fractures: Follow up with Dr. Annalee Genta on outpatient basis. 15. Possible low grade aortic injury: Follow up with Dr. Lenell Antu in the future 16. 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              17,  Dysphagia- hopefully can repeat MBS this week 18. Syncopal episode likely d/t prerenal azotemia. Worsened with loose stool  -pt put in bed, trendelenberg, bp's better  -see #7 and 10 19. Loose stool: likely d/t TF and bladder meds, stopped meds  -adjust TF as needed  -lomotil prn   LOS: 4 days A FACE TO FACE EVALUATION WAS PERFORMED  Ranelle Oyster 07/01/2021, 10:01 AM

## 2021-07-01 NOTE — Progress Notes (Signed)
Occupational Therapy TBI Note  Patient Details  Name: Dinero Chavira MRN: [id number] Date of Birth: January 15, 2001  Session 1:  Today's Date: 07/01/2021 OT Individual Time: 6644-0347 OT Individual Time Calculation (min): 25 min   Session 2:   Today's Date: 07/01/2021 OT Individual Time: 1100-1130 OT Individual Time Calculation (min): 30 min  30 min missed d/t fatigue   Short Term Goals: Week 1:  OT Short Term Goal 1 (Week 1): Pt will maintain static sitting balance EOB with max A of 1 for >5 min in prep for seated ADL. OT Short Term Goal 2 (Week 1): Pt will wash face with min A. OT Short Term Goal 3 (Week 1): Pt will sustain attention on ADL task for >1 min with no more than min VCs. OT Short Term Goal 4 (Week 1): Pt will don shirt with max A.  Skilled Therapeutic Interventions/Progress Updates:    Session 1: Pt received supine, alert and ready for OT session. Pt agreeable to attempt and void in the toilet for more natural. Pt completed bed mobility to EOB with min A. Set up TIS transfer but pt requested to ambulate into the bathroom instead. Pt's father willing to be +2, so completed 10 ft of functional mobility to the bathroom with mod +2 assist. He transferred to the toilet and was given several minutes to attempt and void. He was able to void urine with shower running to stimulate bladder. Following void pt c/o being hot and became visibly diaphoretic. He became very pale and quickly became unresponsive. Pt's dad assisted in total A+2 transfer back to w/c. TIS quickly reclined for BP management. Duress button pushed to get the help of additional staff. Pt transferred back to bed, now fully unresponsive. Bed put in trend position. Charge nurse called rapid response. Vitals obtained- BP 93/53. Pt slowly coming back to and speaking. Pt left in supine with nursing staff present and attending.   Session 2:  Pt received supine, sleeping soundly. He missed initial 30 min of session d/t fatigue.  Returned 30 min later and pt more alert. Pt transferred to EOB with min A following mod cueing for initiation. He donned shorts with mod A sit <> stand. Posterior bias in standing. Stand pivot transfer to the TIS w/c. Pt was taken to the sink where he completed oral care with a toothbrush- min cueing for thoroughness and to brush tongue. He completed face washing with improvement in thoroughness. Pt donned deodorant with poor awareness of errors. Pt was left sitting up in the TIS w/c with his parents present, safety belt on.    Therapy Documentation Precautions:  Precautions Precautions: Fall Precaution Comments: NG tube, foley, HOB >30, NPO, B hand mitts, soft waist belt Restrictions Weight Bearing Restrictions: Yes RLE Weight Bearing: Weight bearing as tolerated Agitated Behavior Scale: TBI Observation Details Observation Environment: pt room Start of observation period - Date: 07/01/21 Start of observation period - Time: 1100 End of observation period - Date: 07/01/21 End of observation period - Time: 1130 Agitated Behavior Scale (DO NOT LEAVE BLANKS) Short attention span, easy distractibility, inability to concentrate: Present to a moderate degree Impulsive, impatient, low tolerance for pain or frustration: Present to a slight degree Uncooperative, resistant to care, demanding: Absent Violent and/or threatening violence toward people or property: Absent Explosive and/or unpredictable anger: Absent Rocking, rubbing, moaning, or other self-stimulating behavior: Absent Pulling at tubes, restraints, etc.: Present to a slight degree Wandering from treatment areas: Absent Restlessness, pacing, excessive movement: Absent Repetitive behaviors,  motor, and/or verbal: Absent Rapid, loud, or excessive talking: Absent Sudden changes of mood: Absent Easily initiated or excessive crying and/or laughter: Absent Self-abusiveness, physical and/or verbal: Absent Agitated behavior scale total  score: 18    Therapy/Group: Individual Therapy  Crissie Reese 07/01/2021, 12:34 PM

## 2021-07-01 NOTE — Progress Notes (Signed)
Patient attempted to void before I/O cath, unsuccessful. Patient alert and oriented to self and place. After 7 loose bowel movements within the past 24hrs on call PA Delle Reining was notified. Ordered lomotil 34mL PRN. Order for stool sample testing for C-diff was verbally given, but per protocol it is to be only order by physician. Placed patient on C-diff precaution until order can be placed in the morning by MD. Patient currently resting in bed with waist restraint in place and call bell with in reach. Will continue with plan of care.

## 2021-07-01 NOTE — Patient Care Conference (Signed)
Inpatient RehabilitationTeam Conference and Plan of Care Update Date: 07/01/2021   Time: 10:07 AM    Patient Name: Jason [name]      Medical Record Number: [id number]  Date of Birth: October 26, 2000 Sex: Male         Room/Bed: 4W16C/4W16C-01 Payor Info: Payor: [employer] / Plan: BCBS COMM PPO / Product Type: *No Product type* /    Admit Date/Time:  06/27/2021  3:59 PM  Primary Diagnosis:  TBI (traumatic brain injury)  Hospital Problems: Principal Problem:   TBI (traumatic brain injury)    Expected Discharge Date: Expected Discharge Date: 07/26/21  Team Members Present: Physician leading conference: Dr. Faith Rogue Social Worker Present: Cecile Sheerer, LCSWA Nurse Present: Kennyth Arnold, RN PT Present: Serina Cowper, PT OT Present: Jake Shark, OT SLP Present: Feliberto Gottron, SLP PPS Coordinator present : Fae Pippin, SLP     Current Status/Progress Goal Weekly Team Focus  Bowel/Bladder   Incontinent of bowel, i/o cath for no void post foley removal; LBM: 12/19 consistant loose bowels  time tolieting per MD orders  assist with tolieting needs prn   Swallow/Nutrition/ Hydration   NPO with NG tube, Overall Mod A for swallow initiation with trials of ice chips  Supervision  Repeat MBS this week   ADL's   Consistent with a RLAS VI, fatigues quickly with poor thoroughness/effort during tasks, CGA-min A sitting balance, mod-max A stand pivots. Max A+2 LB and toileting tasks, min A UB  (S) to min A for cognition  cognitive retraining, ADL retraining, transfers, balance training   Mobility   Mod A overall, gait up to 20 feet with +2 w/c follow for safety, impulsive  Supervision overall  Activity tolerance, functional mobility, gait training, awareness, attention, balance, strengthening, patient/caregiver education   Communication             Safety/Cognition/ Behavioral Observations  Rancho Level V-VI, Max A  Min A  sustained attention, orientation,  basic problem solving   Pain   c/o generalized pain/soreness; scheduled tylenol and prn oxy  control pain level  assess pain level QS and prn   Skin   scattered abrasions, ecchymosis, healing presure injury to R ear  remain free of new skin breakdown/infection  assess skin QS and prn     Discharge Planning:      Team Discussion: Had syncopal episode with OT today. Presents like [place] V. He is perking up and becoming more engaged. Having loose stools due to tube feedings. Family is asking appropriate questions. Will give a bolus of fluids due to vagal episode today. Foley discontinued yesterday. Patient is voiding, incontinent bowel. Reports 10/10 pain. Stage 2 to right ear healing, skin tear to penis healed. Was not impulsive on Monday. Did have a fall on Saturday. Behavior plan initiated. Would like nursing to do oral care using a light for better visual of accumulations in mouth. Discharging home with parents in [place], [place]. Contact guard/min assist sitting, +2 for safety.  Slow to initiate and activate muscles. Fatigues quickly but polite and agreeable. Plan for repeat MBS on Thursday. Patient on target to meet rehab goals: Supervision goals overall, min assist goals for cognition.  *See Care Plan and progress notes for long and short-term goals.   Revisions to Treatment Plan:  Adjusting medications.  Teaching Needs: Family education, medication/pain management, skin/wound care, behavior management, safety awareness, transfer/gait training, etc.  Current Barriers to Discharge: Decreased caregiver support, Home enviroment access/layout, Incontinence, Wound care, Lack of/limited family support,  Weight bearing restrictions, Medication compliance, Behavior, Nutritional means, and pain.  Possible Resolutions to Barriers: Family education Outpatient PT/OT/SLP follow-up Order recommended DME     Medical Summary Current Status: TBI RLAS V. dysphagia, syncopal episode this morning likely d/t  sl prerenal azotemia.  sleeping better.  Barriers to Discharge: Medical stability;Behavior   Possible Resolutions to [employer] Focus: sleep-wake restoration, improve hydration status, daily assessment of labs/pt data   Continued Need for Acute Rehabilitation Level of Care: The patient requires daily medical management by a physician with specialized training in physical medicine and rehabilitation for the following reasons: Direction of a multidisciplinary physical rehabilitation program to maximize functional independence : Yes Medical management of patient stability for increased activity during participation in an intensive rehabilitation regime.: Yes Analysis of laboratory values and/or radiology reports with any subsequent need for medication adjustment and/or medical intervention. : Yes   I attest that I was present, lead the team conference, and concur with the assessment and plan of the team.   Tennis Must 07/01/2021, 1:55 PM

## 2021-07-02 DIAGNOSIS — R55 Syncope and collapse: Secondary | ICD-10-CM

## 2021-07-02 LAB — BASIC METABOLIC PANEL
Anion gap: 8 (ref 5–15)
BUN: 32 mg/dL — ABNORMAL HIGH (ref 6–20)
CO2: 20 mmol/L — ABNORMAL LOW (ref 22–32)
Calcium: 9.2 mg/dL (ref 8.9–10.3)
Chloride: 108 mmol/L (ref 98–111)
Creatinine, Ser: 0.69 mg/dL (ref 0.61–1.24)
GFR, Estimated: >60 mL/min (ref >60)
Glucose, Bld: 99 mg/dL (ref 70–99)
Potassium: 3.9 mmol/L (ref 3.5–5.1)
Sodium: 136 mmol/L (ref 135–145)

## 2021-07-02 LAB — GLUCOSE, CAPILLARY
Glucose-Capillary: 100 mg/dL — ABNORMAL HIGH (ref 70–99)
Glucose-Capillary: 100 mg/dL — ABNORMAL HIGH (ref 70–99)
Glucose-Capillary: 106 mg/dL — ABNORMAL HIGH (ref 70–99)
Glucose-Capillary: 92 mg/dL (ref 70–99)
Glucose-Capillary: 99 mg/dL (ref 70–99)

## 2021-07-02 MED ORDER — QUETIAPINE FUMARATE 25 MG PO TABS
25.0000 mg | ORAL_TABLET | Freq: Every day | ORAL | Status: DC
Start: 2021-07-03 — End: 2021-07-03

## 2021-07-02 NOTE — Progress Notes (Signed)
Physical Therapy TBI Note  Patient Details  Name: Jason [name] MRN: [id number] Date of Birth: August 04, 2000  Today's Date: 07/02/2021 PT Individual Time: 0930-1045 PT Individual Time Calculation (min): 75 min   Short Term Goals: Week 1:  PT Short Term Goal 1 (Week 1): Patient will perform bed mobility with min A >50% of the time with or without hospital bed features. PT Short Term Goal 2 (Week 1): Patient will perform basic transfers with min A >50% of the time using LRAD. PT Short Term Goal 3 (Week 1): Patient will ambulate >50 feet using LRAD with mod A.  Skilled Therapeutic Interventions/Progress Updates:     Patient in bed with is parents at bedside upon PT arrival. Patient alert and agreeable to PT session. Patient denied pain during session.   PT performed +2 assist for toileting to [facility] with SLP earlier this morning without successful void. Patient denied feeling pressure or the need to void at this time. In agreement to attempt voiding again following gait training. Patient mildly orthostatic during toilet transfer with SLP, RN alerted PA and received orders for thigh high TED hose and an abdominal binder. PT educated patient and family on purpose and donning technique of TEDs and abdominal binder for improved BP with postural changes. Donned with total A. Patient asymptomatic with interventions in place throughout session.   Therapeutic Activity: Bed Mobility: Patient performed supine to/from sit with supervision and increased time for initiation to sit upfrom a flat bed without use of bed rails to simulate home set-up. Provided verbal cues for increased independence with mobility, as patient tends to reach for and ask for help with sitting up. Transfers: Patient performed stand pivot bed>w/c with B hands on shoulders and w/c>standing at the toilet using a grab bar and sit to stand x2 with min A. Provided verbal cues for initiation, controlled movements for safety, and use of upper  extremities to push up and control descent. Patient was able to void a large amount in standing over the toilet with min A for balance, performed lower body clothing management with min cues. RN and NT made aware and set up to perform bladder scan at end of session.   Gait Training:  Patient ambulated 50 feet and 110 feet using B hands on shoulders progressing to R arm around therapist's shoulder with min A. Ambulated with ataxic gait with variable foot placement, mild scissoring L>R, decreased gait speed, step length and height L>R, forward trunk flexion with decreased postural control, and narrow BOS. Provided verbal cues for erect posture, increased BOS, and facilitation of forward propulsion and weight shifting.  Neuromuscular Re-ed: Patient performed the following sitting balance activities and cognitive retraining: -During rest breaks, patient attempted counting backwards by 3's from 50 with 4/4 errors, patient able to count forwards to 10 with ease then forwards by 2's to 100 with 1 error with self-correction. He then counted backwards by 2's from 50 with min cues x4, for example, "if you have 40 and take away 2 you have?" -Patient sat EOB with supervision for static and dynamic sitting balance to don sweat pants, patient attempted to initiate task demonstrating apraxia and poor motor control. Provided min A for threading lower extremities with mod cues for sequencing and attention to task. Patient then reached to the floor to don tennis shoes. Patient with increased challenge due to apraxia, stated, "these are not my shoes," and identified that they were his Dad's shoes that were also the patient's size, however, informed the  patient that he would be able to put on these shoes at home and patient in agreement to continue donning tennis shoes. Donned shoes attempting figure four sitting and bending over to place his heel in the shoe, donned L shoe without tying then donned R shoe, unable to indentify  that the shoe laces were not tied or that they needed to be at all. Patient tied both shoes, required PT to retie his R shoe due to it coming untied due to poor application of task. Patient pulled up his pants in standing with min cues.   PT retrieved a new TIS w/c for patient during session, adjusted leg rests to fit patient at end of session.   Patient in TIS handed off to nursing staff for bladder scan with his parents in the room at end of session with breaks locked.   Therapy Documentation Precautions:  Precautions Precautions: Fall Precaution Comments: NG tube, foley, HOB >30, NPO, B hand mitts, soft waist belt Restrictions Weight Bearing Restrictions: No RLE Weight Bearing: Weight bearing as tolerated Agitated Behavior Scale: TBI Observation Details Observation Environment: CIR Start of observation period - Date: 07/02/21 Start of observation period - Time: 0930 End of observation period - Date: 07/02/21 End of observation period - Time: 1045 Agitated Behavior Scale (DO NOT LEAVE BLANKS) Short attention span, easy distractibility, inability to concentrate: Present to a slight degree Impulsive, impatient, low tolerance for pain or frustration: Present to a slight degree Uncooperative, resistant to care, demanding: Absent Violent and/or threatening violence toward people or property: Absent Explosive and/or unpredictable anger: Absent Rocking, rubbing, moaning, or other self-stimulating behavior: Absent Pulling at tubes, restraints, etc.: Absent Wandering from treatment areas: Absent Restlessness, pacing, excessive movement: Absent Repetitive behaviors, motor, and/or verbal: Absent Rapid, loud, or excessive talking: Absent Sudden changes of mood: Absent Easily initiated or excessive crying and/or laughter: Absent Self-abusiveness, physical and/or verbal: Absent Agitated behavior scale total score: 16    Therapy/Group: Individual Therapy  Despina Boan L Nikolas Casher PT,  DPT  07/02/2021, 4:27 PM

## 2021-07-02 NOTE — Progress Notes (Signed)
Orthopedic Tech Progress Note Patient Details:  Jason [name] 2001-06-01 [id number]  Ortho Devices Type of Ortho Device: Abdominal binder Ortho Device/Splint Interventions: Ordered      Genelle Bal [name] 07/02/2021, 10:36 AM

## 2021-07-02 NOTE — Progress Notes (Signed)
PROGRESS NOTE   Subjective/Complaints: Awake in bed. Upset that he was incontinent this morning. No further syncope or similar spells with therapy yesterday.   ROS: Limited due to cognitive/behavioral     Objective:   No results found. Recent Labs    06/30/21 0611  WBC 13.6*  HGB 12.2*  HCT 37.9*  PLT 445*   Recent Labs    07/01/21 1316 07/02/21 0510  NA 135 136  K 4.3 3.9  CL 106 108  CO2 22 20*  GLUCOSE 96 99  BUN 39* 32*  CREATININE 0.74 0.69  CALCIUM 9.5 9.2    Intake/Output Summary (Last 24 hours) at 07/02/2021 0830 Last data filed at 07/02/2021 2641 Gross per 24 hour  Intake 2195.8 ml  Output 1400 ml  Net 795.8 ml     Pressure Injury 06/22/21 Ear Right Stage 2 -  Partial thickness loss of dermis presenting as a shallow open injury with a red, pink wound bed without slough. Pressure injury on cartilage of right ear from head laying on right side (Active)  06/22/21 0130  Location: Ear  Location Orientation: Right  Staging: Stage 2 -  Partial thickness loss of dermis presenting as a shallow open injury with a red, pink wound bed without slough.  Wound Description (Comments): Pressure injury on cartilage of right ear from head laying on right side  Present on Admission: No    Physical Exam: Vital Signs Blood pressure 121/71, pulse 91, temperature (!) 97.5 F (36.4 C), resp. rate 16, height 5\' 10"  (1.778 m), weight 77.3 kg, SpO2 100 %. Constitutional: No distress . Vital signs reviewed. HEENT: NCAT, EOMI, oral membranes moist Neck: supple Cardiovascular: RRR without murmur. No JVD    Respiratory/Chest: CTA Bilaterally without wheezes or rales. Normal effort    GI/Abdomen: BS +, non-tender, non-distended Ext: no clubbing, cyanosis, or edema Psych: pleasant and cooperative  Skin: scattered bruises and abrasions all healing Neurologic: Recalls that I'm his doctor, that he's in [place] at  hospital. 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 Moves all four Fairly calm and focused Sensory exam patient cannot cooperate with sensory examination. Musculoskeletal: Full passive range of motion in all 4 extremities. No joint swelling   Assessment/Plan: 1. Functional deficits which require 3+ hours per day of interdisciplinary therapy in a comprehensive inpatient rehab setting. Physiatrist is providing close team supervision and 24 hour management of active medical problems listed below. Physiatrist and rehab team continue to assess barriers to discharge/monitor patient progress toward functional and medical goals  Care Tool:  Bathing        Body parts bathed by helper: Right arm, Left arm, Chest, Abdomen, Front perineal area, Buttocks, Right upper leg, Left upper leg, Right lower leg, Left lower leg, Face     Bathing assist Assist Level: Dependent - Patient 0%     Upper Body Dressing/Undressing Upper body dressing   What is the patient wearing?: Hospital gown only    Upper body assist Assist Level: Moderate Assistance - Patient 50 - 74%    Lower Body  Dressing/Undressing Lower body dressing      What is the patient wearing?: Incontinence brief     Lower body assist Assist for lower body dressing: Moderate Assistance - Patient 50 - 74%     Toileting Toileting    Toileting assist Assist for toileting: Total Assistance - Patient < 25%     Transfers Chair/bed transfer  Transfers assist  Chair/bed transfer activity did not occur: Safety/medical concerns  Chair/bed transfer assist level: 2 Helpers     Locomotion Ambulation   Ambulation assist      Assist level: 2 helpers Assistive device: Other (comment) (B UEs on therapist's shoulders with +2 w/c follow) Max distance: 15 ft   Walk 10 feet  activity   Assist     Assist level: 2 helpers Assistive device: Other (comment) (B UEs on therapist's shoulders with +2 w/c follow)   Walk 50 feet activity   Assist Walk 50 feet with 2 turns activity did not occur: Safety/medical concerns         Walk 150 feet activity   Assist Walk 150 feet activity did not occur: Safety/medical concerns         Walk 10 feet on uneven surface  activity   Assist Walk 10 feet on uneven surfaces activity did not occur: Safety/medical concerns         Wheelchair     Assist Is the patient using a wheelchair?: Yes Type of Wheelchair: Manual Wheelchair activity did not occur: Safety/medical concerns         Wheelchair 50 feet with 2 turns activity    Assist    Wheelchair 50 feet with 2 turns activity did not occur: Safety/medical concerns       Wheelchair 150 feet activity     Assist  Wheelchair 150 feet activity did not occur: Safety/medical concerns       Blood pressure 121/71, pulse 91, temperature (!) 97.5 F (36.4 C), resp. rate 16, height 5\' 10"  (1.778 m), weight 77.3 kg, SpO2 100 %.  Medical Problem List and Plan: 1. 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  BCVI of R-ICA with pseudoaneurysm/Antithrombotics: -DVT/anticoagulation:  Pharmaceutical: Lovenox             -antiplatelet therapy: ASA/Brillinta  3. Pain Management: Gabapentin 300 mg TID w/Tylenol qid.               --oxycodone prn 4. Mood/sleep-wake: Neuropsych/LCSW to follow for evaluation when appropriate.               -continue sleep chart             -contin scheduled trazodone 50mg  qhs scheduled, klonopin at hs             -hold on stimulant for now given improvement             -antipsychotic agents: seroquel--minimal agitation. Will reduce to qhs only. 5. Neuropsych: This patient is not capable of making decisions on his own  behalf. 6. Skin/Wound Care: Routine pressure relief measures.  7. Fluids/Electrolytes/Nutrition: NPO with tube feeds at 75 cc/hr  --Water flushes 100 cc qid--> increase due to AKI. BUN elevated but stable , has high urine output  12/21- continue 300cc h20 boluses  -run IVF until bag empty then stop 8. Tachycardia: Monitor HR TID--continue Metoprolol bid              Vitals:   07/01/21 1637 07/01/21 1938  BP: 125/82 121/71  Pulse: 89 91  Resp: 18 16  Temp: 98 F (36.7 C) (!) 97.5 F (36.4 C)  SpO2: 100% 100%    9. Acute blood loss anemia: improved Hgb stable >12 10. Urinary retention:              --Started on Silodosin on 12/16.--dc d/t syncopal episode   12/21 continue voiding trial  -I/O cath prn, some incontinent voids  -consider resuming urecholine tomorrow 11. Hypernatremia w/AKI: Improved will increase water flushes to 200 cc qid. BUN still up with              -see #7 12. Leukocytosis: gradually improving   14. Facial fractures: Follow up with Dr. Annalee Genta on outpatient basis. 15. Possible low grade aortic injury: Follow up with Dr. Lenell Antu in the future 16. 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              17,  Dysphagia- hopefully can repeat MBS this week 18. Syncopal episode likely d/t prerenal azotemia. Improved after ivf bolus  -continue current IVF until bag empty  -labs reviewed and better today.  -see above 19. Loose stool: likely d/t TF and bladder meds, stopped meds  -adjust TF as needed  -lomotil prn  -add fiber to regimen   LOS: 5 days A FACE TO FACE EVALUATION WAS PERFORMED  Ranelle Oyster 07/02/2021,  8:30 AM

## 2021-07-02 NOTE — Progress Notes (Signed)
Occupational Therapy TBI Note  Patient Details  Name: Jason [name] MRN: [id number] Date of Birth: 19-Sep-2000  Today's Date: 07/02/2021 OT Individual Time: 1300-1415 OT Individual Time Calculation (min): 75 min    Short Term Goals: Week 1:  OT Short Term Goal 1 (Week 1): Pt will maintain static sitting balance EOB with max A of 1 for >5 min in prep for seated ADL. OT Short Term Goal 2 (Week 1): Pt will wash face with min A. OT Short Term Goal 3 (Week 1): Pt will sustain attention on ADL task for >1 min with no more than min VCs. OT Short Term Goal 4 (Week 1): Pt will don shirt with max A.  Skilled Therapeutic Interventions/Progress Updates:    Pt up in TIS w/c with no c/o pain. Inconsistent responses when OT questioned any dizziness. Pt with periods of poor temperature regulation during session- going from very cold to warm. Pt completed stand pivot transfer from the TIS to the TIS shower chair using the grab bar with mod A. Pt completed UB bathing with min cueing, improved thoroughness. LB bathing with mod A. Improved automatic motor planning overall in more natural bathing environment. Pt completed oral care at the sink following with min cueing for thoroughness. He completed a stand pivot transfer to bed with mod A. He donned a shirt with min A. Mod A to don pants with improvement in motor planning. Sit <> stand with mod A. Pt completed transfer back to supine with (S). He completed a BUE strengthening circuit EOB once he took a supine rest break with a 4lb dumbbell. Core strengthening circuit completed supine with a 4lb dowel. His mother played his music of choice and pt's spirits were visibly lightened. He was left supine with all needs met, bed alarm set.   Therapy Documentation Precautions:  Precautions Precautions: Fall Precaution Comments: NG tube, foley, HOB >30, NPO, B hand mitts, soft waist belt Restrictions Weight Bearing Restrictions: Yes RLE Weight Bearing: Weight bearing  as tolerated    Agitated Behavior Scale: TBI Observation Details Observation Environment: pt room Start of observation period - Date: 07/02/21 Start of observation period - Time: 1300 End of observation period - Date: 07/02/21 End of observation period - Time: 1415 Agitated Behavior Scale (DO NOT LEAVE BLANKS) Short attention span, easy distractibility, inability to concentrate: Present to a slight degree Impulsive, impatient, low tolerance for pain or frustration: Present to a slight degree Uncooperative, resistant to care, demanding: Absent Violent and/or threatening violence toward people or property: Absent Explosive and/or unpredictable anger: Absent Rocking, rubbing, moaning, or other self-stimulating behavior: Absent Pulling at tubes, restraints, etc.: Absent Wandering from treatment areas: Absent Restlessness, pacing, excessive movement: Absent Repetitive behaviors, motor, and/or verbal: Absent Rapid, loud, or excessive talking: Absent Sudden changes of mood: Absent Easily initiated or excessive crying and/or laughter: Absent Self-abusiveness, physical and/or verbal: Absent Agitated behavior scale total score: 16  Therapy/Group: Individual Therapy  Curtis Sites 07/02/2021, 3:03 PM

## 2021-07-02 NOTE — Progress Notes (Signed)
Speech Language Pathology TBI Note  Patient Details  Name: Jason [name] MRN: [id number] Date of Birth: [birth year]  Today's Date: 07/02/2021 SLP Individual Time: 8786-7672 SLP Individual Time Calculation (min): 50 min  Short Term Goals: Week 1: SLP Short Term Goal 1 (Week 1): Pt will initiate and perform basic level tasks (oral care, brush hair, etc) with max A multimodal cues SLP Short Term Goal 2 (Week 1): Pt will sustain attention to basic, familiar tasks for 3-4 minute intervals with max A multimodal cues SLP Short Term Goal 3 (Week 1): Pt will follow 1-step directions during functional tasks with max A multimodal cues SLP Short Term Goal 4 (Week 1): Pt will answer simple yes/no questions via multimodal communication methods (verbal, gestures, communication board) with 50% accuracy provided max A cues SLP Short Term Goal 5 (Week 1): Patient will elicit intentional vocalizations during structured or non-structured speech tasks with max A verbal/visual/tactile cues SLP Short Term Goal 6 (Week 1): Pt will tolerate ice chips with minimal s/s aspiration provided max A cues for bolus attention/awareness/transit  Skilled Therapeutic Interventions: Skilled treatment session focused on dysphagia and cognitive goals. Upon arrival, patient receiving medications and getting his bladder scanned. Patient with a full bladder but patient's parents declined I&O cathing at this time as they wanted him to attempt voiding on his own. With +2 assist from PT, patient was transferred to the [facility] with patient reports of intermittent dizziness. Patient unable to void, RN aware. Throughout transfers, patient followed all commands with extra time and overall supervision level verbal cues. Patient completed thorough oral care via the suction toothbrush with set-up assist and Min verbal cues for attention to task (keeping finger of suction). Patient self-fed trials of ice chips and demonstrated what appeared to be a timely  swallow initiation without overt s/s of aspiration. With larger sips of nectar-thick liquids, patient with an intermittent wet vocal that cleared with cued throat clear and re-swallow. Recommend repeat MBS tomorrow to assess swallow function. Patient remained verbally engaged throughout session with intermittent language of confusion noted. Patient and parents are in agreement. Patient left upright in bed with parents present. Continue with current plan of care.      Pain No/Denies Pain   Agitated Behavior Scale: TBI Observation Details Observation Environment: Patient's room Start of observation period - Date: 07/02/21 Start of observation period - Time: 0830 End of observation period - Date: 07/02/21 End of observation period - Time: 0930 Agitated Behavior Scale (DO NOT LEAVE BLANKS) Short attention span, easy distractibility, inability to concentrate: Present to a slight degree Impulsive, impatient, low tolerance for pain or frustration: Present to a slight degree Uncooperative, resistant to care, demanding: Absent Violent and/or threatening violence toward people or property: Absent Explosive and/or unpredictable anger: Absent Rocking, rubbing, moaning, or other self-stimulating behavior: Absent Pulling at tubes, restraints, etc.: Present to a slight degree Wandering from treatment areas: Absent Restlessness, pacing, excessive movement: Absent Repetitive behaviors, motor, and/or verbal: Absent Rapid, loud, or excessive talking: Absent Sudden changes of mood: Absent Easily initiated or excessive crying and/or laughter: Absent Self-abusiveness, physical and/or verbal: Absent Agitated behavior scale total score: 17  Therapy/Group: Individual Therapy  Frandy Basnett 07/02/2021, 10:19 AM

## 2021-07-03 ENCOUNTER — Inpatient Hospital Stay (HOSPITAL_COMMUNITY): Payer: BC Managed Care – PPO

## 2021-07-03 LAB — GLUCOSE, CAPILLARY
Glucose-Capillary: 109 mg/dL — ABNORMAL HIGH (ref 70–99)
Glucose-Capillary: 111 mg/dL — ABNORMAL HIGH (ref 70–99)
Glucose-Capillary: 111 mg/dL — ABNORMAL HIGH (ref 70–99)
Glucose-Capillary: 123 mg/dL — ABNORMAL HIGH (ref 70–99)
Glucose-Capillary: 91 mg/dL (ref 70–99)
Glucose-Capillary: 93 mg/dL (ref 70–99)

## 2021-07-03 MED ORDER — QUETIAPINE FUMARATE 25 MG PO TABS
25.0000 mg | ORAL_TABLET | Freq: Every day | ORAL | Status: DC
  Administered 2021-07-03 – 2021-07-10 (×8): 25 mg via ORAL
  Filled 2021-07-03 (×8): qty 1

## 2021-07-03 MED ORDER — ASPIRIN 81 MG PO CHEW: 81.0000 mg | CHEWABLE_TABLET | Freq: Every day | ORAL | Status: DC

## 2021-07-03 MED ORDER — OXYCODONE HCL 5 MG PO TABS: 5.0000 mg | ORAL_TABLET | Freq: Four times a day (QID) | ORAL | Status: DC | PRN

## 2021-07-03 MED ORDER — PANTOPRAZOLE SODIUM 40 MG PO TBEC
40.0000 mg | DELAYED_RELEASE_TABLET | Freq: Every day | ORAL | Status: DC
  Administered 2021-07-03 – 2021-07-04 (×2): 40 mg via ORAL
  Filled 2021-07-03 (×2): qty 1

## 2021-07-03 MED ORDER — BIOTENE DRY MOUTH MT LIQD
15.0000 mL | Freq: Two times a day (BID) | OROMUCOSAL | Status: DC
  Administered 2021-07-03 – 2021-07-12 (×16): 15 mL via OROMUCOSAL

## 2021-07-03 MED ORDER — NEPRO/CARBSTEADY PO LIQD
237.0000 mL | Freq: Three times a day (TID) | ORAL | Status: DC
  Administered 2021-07-03 – 2021-07-06 (×6): 237 mL via ORAL

## 2021-07-03 MED ORDER — METHOCARBAMOL 500 MG PO TABS: 500.0000 mg | ORAL_TABLET | Freq: Three times a day (TID) | ORAL | Status: DC | PRN

## 2021-07-03 MED ORDER — ACETAMINOPHEN 325 MG PO TABS: 650.0000 mg | ORAL_TABLET | Freq: Four times a day (QID) | ORAL | Status: DC | PRN

## 2021-07-03 MED ORDER — FREE WATER
200.0000 mL | Freq: Four times a day (QID) | Status: DC
  Administered 2021-07-03 – 2021-07-04 (×5): 200 mL

## 2021-07-03 MED ORDER — CLONAZEPAM 0.25 MG PO TBDP
0.2500 mg | ORAL_TABLET | Freq: Every day | ORAL | Status: DC
  Administered 2021-07-03 – 2021-07-04 (×2): 0.25 mg via ORAL
  Filled 2021-07-03 (×2): qty 1

## 2021-07-03 MED ORDER — PIVOT 1.5 CAL PO LIQD
1000.0000 mL | ORAL | Status: DC
Start: 2021-07-03 — End: 2021-07-08
  Administered 2021-07-03 – 2021-07-07 (×5): 1000 mL
  Filled 2021-07-03 (×6): qty 1000

## 2021-07-03 MED ORDER — BETHANECHOL CHLORIDE 25 MG PO TABS
25.0000 mg | ORAL_TABLET | Freq: Three times a day (TID) | ORAL | Status: DC
  Administered 2021-07-03 – 2021-07-04 (×3): 25 mg via ORAL
  Filled 2021-07-03 (×3): qty 1

## 2021-07-03 MED ORDER — ASPIRIN 81 MG PO CHEW
81.0000 mg | CHEWABLE_TABLET | Freq: Every day | ORAL | Status: DC
  Administered 2021-07-04 – 2021-07-14 (×11): 81 mg via ORAL
  Filled 2021-07-03 (×11): qty 1

## 2021-07-03 MED ORDER — AMLODIPINE BESYLATE 5 MG PO TABS
5.0000 mg | ORAL_TABLET | Freq: Every day | ORAL | Status: DC
  Administered 2021-07-04: 09:00:00 5 mg via ORAL
  Filled 2021-07-03: qty 1

## 2021-07-03 MED ORDER — GABAPENTIN 300 MG PO CAPS
300.0000 mg | ORAL_CAPSULE | Freq: Three times a day (TID) | ORAL | Status: DC
  Administered 2021-07-03 – 2021-07-11 (×24): 300 mg via ORAL
  Filled 2021-07-03 (×25): qty 1

## 2021-07-03 MED ORDER — METOPROLOL TARTRATE 50 MG PO TABS
50.0000 mg | ORAL_TABLET | Freq: Two times a day (BID) | ORAL | Status: DC
  Administered 2021-07-03 – 2021-07-05 (×5): 50 mg via ORAL
  Filled 2021-07-03 (×6): qty 1

## 2021-07-03 NOTE — Progress Notes (Signed)
Patient ID: Jason [name], male   DOB: 2000-12-08, 20 y.o.   MRN: [id number]  Met with patient's mother in patient's room. Introduced myself and explained my role in patient's care. Discussed patient progress during the rehab stay. Very pleased with the progress he is making. Mother is very concerned about his memory returning from [place] and patient's girlfriend was a fatality. I let her know that we do have a Neuropsychologist on staff and the patient would be put on the list to see him. I also informed her that he may even be able to see him on an outpatient basis at the office. I gave her additional information on Brain Injury: Dealing With Confusion, Being A Caregiver, Returning to Work and Play after Traumatic Brain Injury, Behavioral and Personality Changes after Traumatic Brain Injury (TBI), and Potential Medical Complications after Traumatic Brain Injury (TBI). I assured patient's mother that I would be available if she needed to ask more questions or just wanted to talk. I will continue to follow patient's progress.  Dorthula Nettles, RN3, BSN, CBIS, [place], [facility], Inpatient Rehabilitation Office [phone] Cell [phone]

## 2021-07-03 NOTE — Progress Notes (Addendum)
Nutrition Follow-up  DOCUMENTATION CODES:  Not applicable  INTERVENTION:  Switch TF regimen to nocturnal feeds via Cortrak: Pivot 1.5 @ 75 ml/h over 12 hrs (6p-6a)  Provides 1350 kcal (meets 54% of minimum needs), 84 grams protein (meets 60% of minimum needs), 675 ml of free water.  Reduce free water flushes to 200 ml q 4 hours (total fluid of 1875 ml daily).  Add Nepro Shake po TID, each supplement provides 425 kcal and 19 grams protein.  Recommend feeding assistance with meals.  NUTRITION DIAGNOSIS:  Inadequate oral intake related to dysphagia as evidenced by NPO status. - ongoing  GOAL:  Patient will meet greater than or equal to 90% of their needs. - met with TF  MONITOR:  PO intake, Supplement acceptance, Diet advancement, Labs, Weight trends, TF tolerance, I & O's  REASON FOR ASSESSMENT:  Consult Enteral/tube feeding initiation and management  ASSESSMENT:  20 yo male admitted to [facility] on 12/16 with functional deficits secondary to TBI sustained on 06/08/21 with basilar and bilateral temporal bone fractures; also sustained right MCA territory infarct. 11/28 - Cortrak placed; tip gastric per xray  12/1 - s/p carotid arteriogram and pipeline stent placement of R ICA  12/7 - s/p carotid arteriogram and placement of pipeline stents for pseudoaneurysm 12/12 - extubated 12/15 - failed MBS; NPO 12/16 - admitted to [place] 12/22 - repeat MBS (D1/nectar)  RD working remotely.  Reached out to LPN via secure chat. LPN reports no concerns with tolerance today. Pt currently out of room getting repeat MBS with SLP.  Admit wt: 77.1 kg Current wt: 77.8 kg  Previous TF regimen: Tube feeding via Cortrak tube (gastric): Pivot 1.5 to 75 ml/h (1800 ml per day)  Provides 2700 kcal, 168 gm protein, 1366 ml free water daily.  Noted above, pt slowly gaining weight. TF likely meeting needs with weight gain.  Of note, pt with mild BLE edema.  Messaged MD regarding plan to  switch patient to nocturnal feedings over 12 hours to meet about half of his needs since he has passed the MBS and can eat by mouth during the day. MD in agreement.  Also recommend adding Nepro shakes TID, as supplement is already pre-thickened to nectar thick consistency.  Medications: reviewed; FWF 300 ml q 6 hrs, Protonix  Labs: reviewed; CBG 92-123  NUTRITION - FOCUSED PHYSICAL EXAM: Unable to perform - defer to in-person follow-up  Diet Order:   Diet Order             DIET - DYS 1 Room service appropriate? Yes; Fluid consistency: Nectar Thick  Diet effective now                  EDUCATION NEEDS:  Not appropriate for education at this time  Skin:  Skin Assessment: Skin Integrity Issues: Skin Integrity Issues:: Stage II, Other (Comment) Stage II: right ear Other: skin tear on penis  Last BM:  07/01/21 - Type 7, yellow, large  Height:  Ht Readings from Last 1 Encounters:  06/27/21 5' 10"  (1.778 m)   Weight:  Wt Readings from Last 1 Encounters:  07/03/21 77.8 kg   BMI:  Body mass index is 24.61 kg/m.  Estimated Nutritional Needs:  Kcal:  2500-2800 kcals Protein:  140-165 g Fluid:  >/= 2L  Derrel Nip, RD, LDN (she/her/hers) Clinical Inpatient Dietitian RD Pager/After-Hours/Weekend Pager # in [place]

## 2021-07-03 NOTE — Progress Notes (Signed)
PROGRESS NOTE   Subjective/Complaints: Still not emptying and requiring I/O caths. Able to sleep. Walked a lot in therapy yesterday  ROS: Patient denies fever, rash, sore throat, blurred vision, nausea, vomiting, diarrhea, cough, shortness of breath or chest pain, headache, or mood change.     Objective:   No results found. No results for input(s): WBC, HGB, HCT, PLT in the last 72 hours.  Recent Labs    07/01/21 1316 07/02/21 0510  NA 135 136  K 4.3 3.9  CL 106 108  CO2 22 20*  GLUCOSE 96 99  BUN 39* 32*  CREATININE 0.74 0.69  CALCIUM 9.5 9.2    Intake/Output Summary (Last 24 hours) at 07/03/2021 1047 Last data filed at 07/03/2021 0600 Gross per 24 hour  Intake 1081.28 ml  Output 4360 ml  Net -3278.72 ml     Pressure Injury 06/22/21 Ear Right Stage 2 -  Partial thickness loss of dermis presenting as a shallow open injury with a red, pink wound bed without slough. Pressure injury on cartilage of right ear from head laying on right side (Active)  06/22/21 0130  Location: Ear  Location Orientation: Right  Staging: Stage 2 -  Partial thickness loss of dermis presenting as a shallow open injury with a red, pink wound bed without slough.  Wound Description (Comments): Pressure injury on cartilage of right ear from head laying on right side  Present on Admission: No    Physical Exam: Vital Signs Blood pressure 119/68, pulse 100, temperature 97.9 F (36.6 C), resp. rate 17, height 5\' 10"  (1.778 m), weight 77.8 kg, SpO2 98 %. Constitutional: No distress . Vital signs reviewed. HEENT: NCAT, EOMI, oral membranes moist, NGT, dry coating still on tongue Neck: supple Cardiovascular: RRR without murmur. No JVD    Respiratory/Chest: CTA Bilaterally without wheezes or rales. Normal effort    GI/Abdomen: BS +, non-tender, non-distended Ext: no clubbing, cyanosis, or edema Psych: pleasant and cooperative  Skin: scattered  bruises and abrasions all healing Neurologic: Recalls that I'm his doctor and issues regarding his care. Remembered therapy yesterday. 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 Moves all four Fairly calm and focused Sensory exam appears to be grossly intact Musculoskeletal: Full passive range of motion in all 4 extremities. No joint swelling   Assessment/Plan: 1. Functional deficits which require 3+ hours per day of interdisciplinary therapy in a comprehensive inpatient rehab setting. Physiatrist is providing close team supervision and 24 hour management of active medical problems listed below. Physiatrist and rehab team continue to assess barriers to discharge/monitor patient progress toward functional and medical goals  Care Tool:  Bathing        Body parts bathed by helper: Right arm, Left arm, Chest, Abdomen, Front perineal area, Buttocks, Right upper leg, Left upper leg, Right lower leg, Left lower leg, Face     Bathing assist Assist Level: Dependent - Patient 0%     Upper Body Dressing/Undressing Upper body dressing   What is the patient wearing?: Hospital gown only    Upper body assist Assist  Level: Moderate Assistance - Patient 50 - 74%    Lower Body Dressing/Undressing Lower body dressing      What is the patient wearing?: Incontinence brief     Lower body assist Assist for lower body dressing: Moderate Assistance - Patient 50 - 74%     Toileting Toileting    Toileting assist Assist for toileting: Total Assistance - Patient < 25%     Transfers Chair/bed transfer  Transfers assist  Chair/bed transfer activity did not occur: Safety/medical concerns  Chair/bed transfer assist level: 2 Helpers     Locomotion Ambulation   Ambulation assist      Assist level: 2 helpers Assistive device: Other  (comment) (B UEs on therapist's shoulders with +2 w/c follow) Max distance: 15 ft   Walk 10 feet activity   Assist     Assist level: 2 helpers Assistive device: Other (comment) (B UEs on therapist's shoulders with +2 w/c follow)   Walk 50 feet activity   Assist Walk 50 feet with 2 turns activity did not occur: Safety/medical concerns         Walk 150 feet activity   Assist Walk 150 feet activity did not occur: Safety/medical concerns         Walk 10 feet on uneven surface  activity   Assist Walk 10 feet on uneven surfaces activity did not occur: Safety/medical concerns         Wheelchair     Assist Is the patient using a wheelchair?: Yes Type of Wheelchair: Manual Wheelchair activity did not occur: Safety/medical concerns         Wheelchair 50 feet with 2 turns activity    Assist    Wheelchair 50 feet with 2 turns activity did not occur: Safety/medical concerns       Wheelchair 150 feet activity     Assist  Wheelchair 150 feet activity did not occur: Safety/medical concerns       Blood pressure 119/68, pulse 100, temperature 97.9 F (36.6 C), resp. rate 17, height 5\' 10"  (1.778 m), weight 77.8 kg, SpO2 98 %.  Medical Problem List and Plan: 1. 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  BCVI of R-ICA with pseudoaneurysm/Antithrombotics: -DVT/anticoagulation:  Pharmaceutical: Lovenox             -antiplatelet therapy: ASA/Brillinta  3. Pain Management: Gabapentin 300 mg TID w/Tylenol qid.               --oxycodone  prn  -changed robaxin to prn  as mom saw it causing sedation 4. Mood/sleep-wake: Neuropsych/LCSW to follow for evaluation when appropriate.              -continue sleep chart             -contin scheduled trazodone 50mg  qhs scheduled, klonopin at hs             -hold on stimulant for now given improvement             -  antipsychotic agents: seroquel--minimal agitation. Have reduced to qhs only. 5. Neuropsych: This patient is not capable of making decisions on his own behalf. 6. Skin/Wound Care: Routine pressure relief measures.  7. Fluids/Electrolytes/Nutrition: NPO with tube feeds at 75 cc/hr  --Water flushes 100 cc qid--> increase due to AKI. BUN elevated but stable , has high urine output  12/22- continue 300cc h20 boluses  -now on po diet, push fluids, reduce TF  -check labs tomorrow 8. Tachycardia: Monitor HR TID--continue Metoprolol bid              Vitals:   07/02/21 1445 07/03/21 0428  BP: 111/68 119/68  Pulse: 80 100  Resp: 18 17  Temp: 97.9 F (36.6 C) 97.9 F (36.6 C)  SpO2: 100% 98%    9. Acute blood loss anemia: improved Hgb stable >12 10. Urinary retention:              --Started on Silodosin on 12/16.--dc d/t syncopal episode   12/22 continue voiding trial  -I/O cath mostly right now, large volumes  -resume urecholine 25mg  tid today 11. Hypernatremia w/AKI: Improved will increase water flushes to 200 cc qid. BUN still up with              -see #7 12. Leukocytosis: gradually improving   14. Facial fractures: Follow up with Dr. on outpatient basis. 15. Possible low grade aortic injury: Follow up with Dr. Annalee Genta in the future 16. Cranial nerve III palsy: Follow up with Dr. Lenell Antu after discharge.              17,  Dysphagia- started on D1/Nectar diet per SLP  -reducing TF, if eats well dc ngt tomorrow or sat 18. Syncopal episode likely d/t prerenal azotemia. Improved after ivf bolus  -improved. Continue fluid resuscitation, pushing lfuids 19. Loose  stool: likely d/t TF and bladder meds, stopped meds  -adjust TF as needed  -lomotil prn  -added fiber to regimen   LOS: 6 days A FACE TO FACE EVALUATION WAS PERFORMED  Celene Skeen 07/03/2021, 10:47 AM

## 2021-07-03 NOTE — Progress Notes (Addendum)
Modified Barium Swallow Progress Note  Patient Details  Name: Jason [name] MRN: [id number] Date of Birth: 11/18/2000  Today's Date: 07/03/2021  Modified Barium Swallow completed.  Full report located under Chart Review in the Imaging Section.  Brief recommendations include the following:  Clinical Impression  Patient demonstrates a primarily oral dysphagia complicated by impulsivity and decreased coordination impacting airway protection. Patient with right CNVII impairment leading to decreased labial seal and intraoral pressure with intermittent slurping of boluses. Patient with decreased bolus cohesion with weak lingual manipulation and bolus propulsion resulting in premature spillage of liquids to the pyriform sinuses resulting in aspiration before and during the swallow. A chin tuck with a straw placed on the left side of his oral cavity to help facilitate positioning was successful in improving oral containment and eliminating penetration and aspiration with nectar-thick liquids but still resulted in aspiration with thin liquids. A head turn to the right was successful in eliminating aspiration with thin liquids via straw but patient demonstrated difficulty maintaining and utilizing positioning consistently.  Patient's moderate oral deficits also impacted efficient mastication of Dys. 2 textures. Recommend patient initiate a diet of Dys. 1 textures with nectar-thick liquids with a straw to facilitate a chin tuck. Also recommend therapeutic trails of thin liquids via straw with utilization of a head turn to the right. Patient's parents educated regarding MBS results, diet recommendations, appropriate textures, compensatory strategies, medication administration and how to thicken liquids. Patient's parents verbalized understanding.     Swallow Evaluation Recommendations       SLP Diet Recommendations: Dysphagia 1 (Puree) solids;Nectar thick liquid   Liquid Administration via: Straw    Medication Administration: Crushed with puree   Supervision: Patient able to self feed;Full supervision/cueing for compensatory strategies   Compensations: Minimize environmental distractions;Slow rate;Small sips/bites;Chin tuck;Use straw to facilitate chin tuck;Monitor for anterior loss       Oral Care Recommendations: Oral care QID   Other Recommendations: Order thickener from pharmacy;Prohibited food (jello, ice cream, thin soups);Clarify dietary restrictions;Remove water pitcher;Have oral suction available    [name] 07/03/2021,4:21 PM

## 2021-07-03 NOTE — Progress Notes (Signed)
Occupational Therapy Session Note  Patient Details  Name: Jason [name] MRN: [id number] Date of Birth: [birth year]  Today's Date: 07/03/2021 OT Individual Time: 0340-3524 OT Individual Time Calculation (min): 70 min    Short Term Goals: Week 1:  OT Short Term Goal 1 (Week 1): Pt will maintain static sitting balance EOB with max A of 1 for >5 min in prep for seated ADL. OT Short Term Goal 2 (Week 1): Pt will wash face with min A. OT Short Term Goal 3 (Week 1): Pt will sustain attention on ADL task for >1 min with no more than min VCs. OT Short Term Goal 4 (Week 1): Pt will don shirt with max A.  Skilled Therapeutic Interventions/Progress Updates:    Pt resting in bed upon arrival with parents present. Pt wanted to attempt voiding in toilet and amb with HHA (min A) and stood at toilet with min A. Pt unsuccessful and returned to EOB. Pt donned shoes with mod A for fastening. Pt transferred to w/c with min A. Pt initially engaged in core strengthening seated unsupported EOM. BUE circuit with 4# bar. Pt returned to w/c and transitioned to gym. Pt engaged in problem solving table tasks with PVC pipe and colored peg board. Pt completed simple PVC structure with min verbal cues after piping preselected. Pt completed simple peg board pattern with supervision. Pt unable to complete more complex pattern and became frustrated. Pt also reported that his "brain was tired." Pt returned to room and amb with HHA to bathroom to attempt voiding while standing at toilet. Again unsuccessful and returned to EOB. Sit>supine with supervisoin. Pt reamined in bed with mother present. RN present.   Therapy Documentation Precautions:  Precautions Precautions: Fall Precaution Comments: NG tube, foley, HOB >30, NPO, B hand mitts, soft waist belt Restrictions Weight Bearing Restrictions: No RLE Weight Bearing: Weight bearing as tolerated   Pain:  Pt denies pain this morning   Therapy/Group: Individual  Therapy  Rich Brave 07/03/2021, 1:03 PM

## 2021-07-03 NOTE — Progress Notes (Signed)
Physical Therapy TBI Note  Patient Details  Name: Jason [name] MRN: [id number] Date of Birth: 01-04-2001  Today's Date: 07/03/2021 PT Individual Time: 9528-4132 and 1400-1500 PT Individual Time Calculation (min): 45 min and 60 min  Short Term Goals: Week 1:  PT Short Term Goal 1 (Week 1): Patient will perform bed mobility with min A >50% of the time with or without hospital bed features. PT Short Term Goal 2 (Week 1): Patient will perform basic transfers with min A >50% of the time using LRAD. PT Short Term Goal 3 (Week 1): Patient will ambulate >50 feet using LRAD with mod A.  Skilled Therapeutic Interventions/Progress Updates:     Session 1: Patient in bed with parents at bedside upon PT arrival. Patient alert and agreeable to PT session. Patient denied pain during session.  Patient with upcoming MBS following PT session. Provided education on transport and expectations for the procedure and differed further questions to SLP. Patient eager to work on gait training this morning.  Taped R eye lid open for improved visual input and use of visual strategies for balance during mobility. Placed clear glasses with R lens taped on the medial side for reduced double vision with mobility.   Therapeutic Activity: Bed Mobility: Donned B TED hose with total assist bed level prior to mobility. Patient performed supine to/from sit with supervision with increased time for initiation and pushing up to sitting from a flat bed without use of bed rails. Provided verbal cues for use of bottom elbow and forward trunk rotation for sitting up. Patient sat at edge of bed donned tennis shoes with setup and supervision with increased time and mod cues for fixing the back of his heel and tying the laces, did have to retie the R lace for safety due to the laces reaching the floor. Demonstrates reduced motor apraxia with this task this morning.  Transfers: Patient performed sit to/from stand x3 with min-mod A  without AD. Provided verbal cues forforward weight shift, pushing up and reaching back for arm rest and slow controled movements.  Gait Training:  Patient ambulated 64 feet, 140 feet, and 271 feet without AD with mod progressing to min A. Ambulated with ataxic gait with variable foot placement, mild scissoring L>R, decreased gait speed, step length and height L>R, forward trunk flexion with decreased postural control, and narrow BOS. Provided verbal cues for increased base of support, consistent foot placement, and core and lateral hip muscle activation for stabilization. Patient with shorter distance on first trial due to LOB with mod A to correct due to external distraction. Provided cues for attention to task during subsequent trials.   Patient in bed handed of to transport team with parents at bedside at end of session.  Session 2: Patient in bed with parents at bedside upon PT arrival. Patient alert and agreeable to PT session. Patient denied pain during session.  Patient reports that he does not like the puree diet, but felt he did very well on the MBS. Focuses session on gait and stair training and core and lower extremity motor control for improved postural control with functional mobility.  Taped R eye lid open for improved visual input and use of visual strategies for balance during mobility. Placed clear glasses with R lens taped on the medial side for reduced double vision with mobility.   Therapeutic Activity: Bed Mobility: Patient performed supine to/from sit with supervision. Provided verbal cues as above. TED hose donned from previous session. Patient sat at edge  of bed donned tennis shoes with setup and supervision, sliding shoes on with laces tied with 1 cue for fixing the heel of the shoe. Transfers: Patient performed sit to/from stand x4 with min A progressing to CGA without AD. Provided verbal cues as above.  Gait Training:  Patient ambulated 173 feet without AD with min A.  Ambulated as above, focused on control and reduced crossing over with turns.  Patient ascended/descended 4 steps x2 using B rails with min A and +2 SBA for safety. Performed gait pattern reciprocal gait pattern while ascending and step-to gait pattern while descending. Provided cues for technique and sequencing. Patient with increased difficulty looking down during task. Performed visual assessment, patient continues to have limited R eye movement in all directions, but is able to initiate movement out of midline, but immediately returns to midline after. Patient denied double vision with monocular and binocular vision, however reports double vision with binocular downward gaze.  Neuromuscular Re-ed: Patient performed the following core and lower extremity motor control activities: -alternating step-taps x1 on 6" step with B hands on rails -step ups R/L x1 on 6" steps with B hands on rails -sit to stand 2x10 without AD with CGA-supervision focused on use of intrinsic feedback for postural correction and controlled descent; provided facilitation x3 for increased trunk and hip flexion for improved balance on descent to sitting -sit-ups from reclined position on physio ball handing a 1 lb ball to his father from his chest to full elbow extension x10, performed with oblique rotation on second trial x10  Patient reported RPE 8/10 after activities, then provided the option to walk or ride in the TIS w/c back to the room. Patient appropriately opted to ride back to the room due to increased fatigue level.   Patient in bed with his parents at bedside at end of session with breaks locked, bed alarm set, and all needs within reach.   Therapy Documentation Precautions:  Precautions Precautions: Fall Precaution Comments: NG tube, foley, HOB >30, NPO, B hand mitts, soft waist belt Restrictions Weight Bearing Restrictions: No RLE Weight Bearing: Weight bearing as tolerated Agitated Behavior  Scale: TBI Observation Details Observation Environment: CIR Start of observation period - Date: 07/03/21 Start of observation period - Time: 0800 End of observation period - Date: 07/03/21 End of observation period - Time: 0845 Agitated Behavior Scale (DO NOT LEAVE BLANKS) Short attention span, easy distractibility, inability to concentrate: Present to a slight degree Impulsive, impatient, low tolerance for pain or frustration: Present to a slight degree Uncooperative, resistant to care, demanding: Absent Violent and/or threatening violence toward people or property: Absent Explosive and/or unpredictable anger: Absent Rocking, rubbing, moaning, or other self-stimulating behavior: Absent Pulling at tubes, restraints, etc.: Absent Wandering from treatment areas: Absent Restlessness, pacing, excessive movement: Absent Repetitive behaviors, motor, and/or verbal: Absent Rapid, loud, or excessive talking: Absent Sudden changes of mood: Absent Easily initiated or excessive crying and/or laughter: Absent Self-abusiveness, physical and/or verbal: Absent Agitated behavior scale total score: 16 Agitated Behavior Scale: TBI Observation Details Observation Environment: CIR Start of observation period - Date: 07/03/21 Start of observation period - Time: 1400 End of observation period - Date: 07/03/21 End of observation period - Time: 1500 Agitated Behavior Scale (DO NOT LEAVE BLANKS) Short attention span, easy distractibility, inability to concentrate: Present to a slight degree Impulsive, impatient, low tolerance for pain or frustration: Present to a slight degree Uncooperative, resistant to care, demanding: Absent Violent and/or threatening violence toward people or property: Absent Explosive  and/or unpredictable anger: Absent Rocking, rubbing, moaning, or other self-stimulating behavior: Absent Pulling at tubes, restraints, etc.: Absent Wandering from treatment areas:  Absent Restlessness, pacing, excessive movement: Absent Repetitive behaviors, motor, and/or verbal: Absent Rapid, loud, or excessive talking: Absent Sudden changes of mood: Absent Easily initiated or excessive crying and/or laughter: Absent Self-abusiveness, physical and/or verbal: Absent Agitated behavior scale total score: 16    Therapy/Group: Individual Therapy  Tache Bobst L Cieanna Stormes PT, DPT  07/03/2021, 4:28 PM

## 2021-07-03 NOTE — Discharge Instructions (Addendum)
Inpatient Rehab Discharge Instructions  Jason [name] Discharge date and time:    Activities/Precautions/ Functional Status: Activity: no lifting, driving, or strenuous exercise for till cleared by MD Diet: regular diet Wound Care: keep wound clean and dry   Functional status:  ___ No restrictions     ___ Walk up steps independently ___ 24/7 supervision/assistance   ___ Walk up steps with assistance ___ Intermittent supervision/assistance  ___ Bathe/dress independently ___ Walk with walker     ___ Bathe/dress with assistance ___ Walk Independently    ___ Shower independently ___ Walk with assistance    ___ Shower with assistance ___ No alcohol     ___ Return to work/school ________   COMMUNITY REFERRALS UPON DISCHARGE:    Outpatient: PT     OT    ST                 Agency:[facility]  Phone:[phone]              Appointment Date/Time:*Please expect follow-up within 7-10 business days to schedule your appointment. If you have not received follow-up, be sure to contact the site directly.*  Outpatient:   Other: Neuropsych             Agency:[facility] [phone]              Appointment Date/Time:*Please follow-up to check status of referral. I will send an email once I confirm if referral is accepted.*      Special Instructions:    My questions have been answered and I understand these instructions. I will adhere to these goals and the provided educational materials after my discharge from the hospital.  Patient/Caregiver Signature _______________________________ Date __________  Clinician Signature _______________________________________ Date __________  Please bring this form and your medication list with you to all your follow-up doctor's appointments.

## 2021-07-04 LAB — BASIC METABOLIC PANEL
Anion gap: 9 (ref 5–15)
BUN: 27 mg/dL — ABNORMAL HIGH (ref 6–20)
CO2: 23 mmol/L (ref 22–32)
Calcium: 9.4 mg/dL (ref 8.9–10.3)
Chloride: 102 mmol/L (ref 98–111)
Creatinine, Ser: 0.69 mg/dL (ref 0.61–1.24)
GFR, Estimated: >60 mL/min (ref >60)
Glucose, Bld: 111 mg/dL — ABNORMAL HIGH (ref 70–99)
Potassium: 4.1 mmol/L (ref 3.5–5.1)
Sodium: 134 mmol/L — ABNORMAL LOW (ref 135–145)

## 2021-07-04 LAB — GLUCOSE, CAPILLARY
Glucose-Capillary: 101 mg/dL — ABNORMAL HIGH (ref 70–99)
Glucose-Capillary: 108 mg/dL — ABNORMAL HIGH (ref 70–99)
Glucose-Capillary: 122 mg/dL — ABNORMAL HIGH (ref 70–99)
Glucose-Capillary: 123 mg/dL — ABNORMAL HIGH (ref 70–99)
Glucose-Capillary: 124 mg/dL — ABNORMAL HIGH (ref 70–99)

## 2021-07-04 MED ORDER — BETHANECHOL CHLORIDE 25 MG PO TABS
25.0000 mg | ORAL_TABLET | Freq: Four times a day (QID) | ORAL | Status: DC
  Administered 2021-07-04 – 2021-07-05 (×7): 25 mg via ORAL
  Filled 2021-07-04 (×8): qty 1

## 2021-07-04 MED ORDER — FREE WATER
300.0000 mL | Freq: Four times a day (QID) | Status: DC
  Administered 2021-07-04 – 2021-07-08 (×15): 300 mL

## 2021-07-04 MED ORDER — FAMOTIDINE 20 MG PO TABS
10.0000 mg | ORAL_TABLET | Freq: Two times a day (BID) | ORAL | Status: DC
  Administered 2021-07-04 – 2021-07-14 (×21): 10 mg via ORAL
  Filled 2021-07-04 (×21): qty 1

## 2021-07-04 NOTE — Progress Notes (Signed)
Speech Language Pathology Weekly Progress and Session Note  Patient Details  Name: Jason [name] MRN: [id number] Date of Birth: Mar 05, 2001  Beginning of progress report period: June 27, 2021 End of progress report period: July 04, 2021  Today's Date: 07/04/2021 SLP Individual Time: 0720-0815 SLP Individual Time Calculation (min): 55 min  Short Term Goals: Week 1: SLP Short Term Goal 1 (Week 1): Pt will initiate and perform basic level tasks (oral care, brush hair, etc) with max A multimodal cues SLP Short Term Goal 1 - Progress (Week 1): Met SLP Short Term Goal 2 (Week 1): Pt will sustain attention to basic, familiar tasks for 3-4 minute intervals with max A multimodal cues SLP Short Term Goal 2 - Progress (Week 1): Met SLP Short Term Goal 3 (Week 1): Pt will follow 1-step directions during functional tasks with max A multimodal cues SLP Short Term Goal 3 - Progress (Week 1): Met SLP Short Term Goal 4 (Week 1): Pt will answer simple yes/no questions via multimodal communication methods (verbal, gestures, communication board) with 50% accuracy provided max A cues SLP Short Term Goal 4 - Progress (Week 1): Met SLP Short Term Goal 5 (Week 1): Patient will elicit intentional vocalizations during structured or non-structured speech tasks with max A verbal/visual/tactile cues SLP Short Term Goal 5 - Progress (Week 1): Met SLP Short Term Goal 6 (Week 1): Pt will tolerate ice chips with minimal s/s aspiration provided max A cues for bolus attention/awareness/transit SLP Short Term Goal 6 - Progress (Week 1): Met    New Short Term Goals: Week 2: SLP Short Term Goal 1 (Week 2): Patient will consume current diet with minimal overt s/s of aspiration with Mod verbal cues for use of swallowing compensatory strategies. SLP Short Term Goal 2 (Week 2): Patient will consume trials of thin liquids via straw while utilizing a head turn to the right with Mod verbal cues with minimal overt s/s of  aspiration over 3 sessions to assess readiness for repeat MBS and/or upgrade. SLP Short Term Goal 3 (Week 2): Patient will demonstate selective attention to functional tasks for 30 minutes in a mildly distracting enviornment with Min verbal cues for redirection. SLP Short Term Goal 4 (Week 2): Patient will utilize external aids for orientation to place, time and situation with Min verbal and visual cues. SLP Short Term Goal 5 (Week 2): Patient will demonstrate functional problem solving for basic and familiar tasks with mod verbal cues. SLP Short Term Goal 6 (Week 2): Patient will self-monitor and correct errors during functional tasks with mod verbal cues.  Weekly Progress Updates: Patient has made excellent gains and has met 6 of 6 STGs this reporting period. 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 Patient is independently expressing his wants/needs at the sentence level but demonstrates intermittent language of confusion. 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 However, patient is 100% intelligible at the sentence level. Patient with a repeat MBS this week and demonstrates moderate oral deficits resulting in aspiration of nectar-thick and thin liquids that were reduced with postural modifications. Therefore, patient initiated a diet of Dys. 1 textures with nectar-thick liquids via straw with use of a chin tuck. Patient and family education ongoing. Patient would benefit from continued  skilled SLP intervention to maximize his cognitive and swallowing function prior to discharge.      Intensity: Minumum of 1-2 x/day, 30 to 90 minutes Frequency: 3 to 5 out of 7  days Duration/Length of Stay: 07/26/21 Treatment/Interventions: English as a second language teacher;Dysphagia/aspiration precaution training;Functional tasks;Patient/family education;Therapeutic Activities;Therapeutic Exercise;Internal/external aids;Environmental controls;Cognitive remediation/compensation   Daily Session  Skilled Therapeutic Interventions:  Skilled treatment session focused on dysphagia and cognitive goals. Upon arrival, patient was awake in bed and agreeable to eating his breakfast meal. Patient oriented to place and time with Mod I. Patient required Max verbal cues for recall of swallowing compensatory strategies. Patient consumed his breakfast meal of Dys. 1 textures with nectar-thick liquids via straw with Mod verbal cues for carryover of strategy throughout meal resulting in an intermittent wet vocal quality that cleared with a cued throat clear. Patient reports dislike of most pureed foods but did consume ~90% of a vanilla greek yogurt. Recommend patient continue current diet. Patient asking appropriate questions regarding what goals needed to be completed for diet upgrade. Patient demonstrated sustained attention to meal for ~20 minutes with Min verbal cues for redirection. Patient left upright in bed with alarm on and all needs within reach. Continue with current plan of care.     Pain No/Denies Pain   Therapy/Group: Individual Therapy  Chasya Zenz 07/04/2021, 5:53 AM

## 2021-07-04 NOTE — Progress Notes (Signed)
Patient with recurrent syncopal/presyncope as ambulated to BR without TEDs or binder. Discussed with Dr. Nona Dell d/c Norvasc and increase water flushes to 300 cc qid.

## 2021-07-04 NOTE — Progress Notes (Signed)
Occupational Therapy TBI Note  Patient Details  Name: Jason [name] MRN: [id number] Date of Birth: 2001/02/20  Today's Date: 07/04/2021 OT Individual Time: 7371-0626 OT Individual Time Calculation (min): 74 min    Short Term Goals: Week 1:  OT Short Term Goal 1 (Week 1): Pt will maintain static sitting balance EOB with max A of 1 for >5 min in prep for seated ADL. OT Short Term Goal 2 (Week 1): Pt will wash face with min A. OT Short Term Goal 3 (Week 1): Pt will sustain attention on ADL task for >1 min with no more than min VCs. OT Short Term Goal 4 (Week 1): Pt will don shirt with max A.  Skilled Therapeutic Interventions/Progress Updates:  Pt greeted supine in bed  agreeable to OT intervention. RN present providing meds and completing bladder scan.  Session focus on BADL reeducation, functional mobility, attention, problem solving, and decreasing overall burden of care. Pt completed supine>sit with MIN A to elevate trunk. Pt completed sit<>stand from EOB with HHA and MIN A. Ambulatory toilet transfer with MIN HHA with +2 from mother for safety, pt needed MOD A for 3/3 toileting tasks needing assist for clothing mgmt, - urine void. Pt ambulated from bathroom to TIS with MIN HHA +2 for safety. Pt declined shower and completed wash up at sink with MIN A, with overall supervision for UB Bathing only. Pt completed UB dressing with OH shirt with MIN A as pt reports "I dont know how to do this" after handing pt shirt. Pt donned pants with MIN A to pull pants up to waist line in standing. Pt transported to gym with total A where able to ambulated ~ 20 ft with MIN HHA  +2. Pt initially presenting with scissoring gait but able to correct with increased time and verbal cues. Remainder of session to focus on attention and problem solving. Pt engaged in therapeutic activity of playing "go fish" to facilitate improvement with selective attention as pt had to ignore irrelevant stimuli in gym while recalling  rules of game and turn taking. Pt did well with tasks able to accurately recall rules of game and able to determine who's turn it was when asked. Pt additionally played matching game with pt working on working memory to recall positioning of placement of cards when it was pts turn. Pt also engaging in conversation with OTA during game facilitating alternating attention. Pt completed matching game with overall supervision. Pt returned to room with total A where pt completed additional toilet transfer via functional mobility with MIN HHA +2 for safety with pt abel to urinate a small amount into toilet, RN aware.   pt left up in TIS with pts mom present as they planned to roll around unit.                 Therapy Documentation Precautions:  Precautions Precautions: Fall Precaution Comments: NG tube, foley, HOB >30, NPO, B hand mitts, soft waist belt Restrictions Weight Bearing Restrictions: No RLE Weight Bearing: Weight bearing as tolerated  Pain: no pain reported during session    Agitated Behavior Scale: TBI Observation Details Observation Environment: CIR Start of observation period - Date: 07/04/21 Start of observation period - Time: 0916 End of observation period - Date: 07/04/21 End of observation period - Time: 1030 Agitated Behavior Scale (DO NOT LEAVE BLANKS) Short attention span, easy distractibility, inability to concentrate: Present to a slight degree Impulsive, impatient, low tolerance for pain or frustration: Absent Uncooperative, resistant to care,  demanding: Absent Violent and/or threatening violence toward people or property: Absent Explosive and/or unpredictable anger: Absent Rocking, rubbing, moaning, or other self-stimulating behavior: Absent Pulling at tubes, restraints, etc.: Absent Wandering from treatment areas: Absent Restlessness, pacing, excessive movement: Absent Repetitive behaviors, motor, and/or verbal: Absent Rapid, loud, or excessive talking:  Absent Sudden changes of mood: Absent Easily initiated or excessive crying and/or laughter: Absent Self-abusiveness, physical and/or verbal: Absent Agitated behavior scale total score: 15    Therapy/Group: Individual Therapy  Pollyann Glen [facility] 07/04/2021, 10:34 AM

## 2021-07-04 NOTE — Progress Notes (Signed)
Physical Therapy TBI Note  Patient Details  Name: Jason [name] MRN: [id number] Date of Birth: [birth year]  Today's Date: 07/04/2021 PT Individual Time: 0254-2706 PT Individual Time Calculation (min): 55 min   Short Term Goals: Week 1:  PT Short Term Goal 1 (Week 1): Patient will perform bed mobility with min A >50% of the time with or without hospital bed features. PT Short Term Goal 2 (Week 1): Patient will perform basic transfers with min A >50% of the time using LRAD. PT Short Term Goal 3 (Week 1): Patient will ambulate >50 feet using LRAD with mod A.   Skilled Therapeutic Interventions/Progress Updates:  Patient supine and asleep in bed on entrance to room. Mother present in room and discussed pt's recent history and day with mother. Pt has been having a difficult day with n/v after receiving medications this morning and not happy with morning toileting attempt.   Patient wakes easily and is reasonably quickly alert and agreeable to PT session.   Patient with no pain complaint throughout session. But does c/o dizzines with near syncopal episode.   Therapeutic Activity: Bed Mobility: Patient performed supine <> sit with supervision. VC/ tc required for initiation for rising to reach seated position but is able to return to supine at end of session with no verbal prompting. Transfers: Patient performed sit<>stand  transfers initially with high impulsiveness requiring Mod A to attain balance. Improves to CGA/ minA overall with conscious practice and verbal cueing for steps prior to performance to reduce impulsive nature. Stand pivot transfers throughout session with Mod A for ataxic stepping, maintaining balance, and positioning. Provided vc/tc throughout for technique/ balance.  Education on Actuary and relating practice of breaking down skills for conscious practice in volleyball and baseball to breaking down components of rising to stand and controlling descent to sit.  Can't perform these impulsively right now b/c brain does not know how to perform movements smoothly. Final sit <>stand performed with CGA and intermittent MinA for standing balance. Maintained standing for prior to dizziness.  Gait Training:  Patient ambulated short distances within room with no AD and LUE over therapist's shoulders with Min/ ModA. Male family member providing HHA to RUE. Ambulation to stand at toilet for attempt to urinate. Pt turns to leave bathroom and begins to have syncopal episode. Therapist talks to pt and keeps focus from complete syncope. Able to ambulate with heavy mod A and max vc to reach w/c to sit with +2 of family. Once seated, pt states improvement in symptoms as his vision "had started to close in". BP taken with reading at 103/ 69 and pulse at 91.    Pt's brought to standing again and BP taken with reading at 103/ 69 and pulse at 128. Mother brings TED hose and abdominal binder. Items donned with Max A. Attempt to stand again after with items donned. Pt states feeling of headache and vision closing in. Stand pivot to bedside and inseated position, pt states feeling better. BP started in standing and completed in sitting with reading at 116/ 92 and pulse at 96.  Discussion with mother re: pt's recent hx with requiring fluids and drop in BP after fluids discontinued. Also discussed pt's new BP medication and potential interaction.   Patient supine  in bed at end of session with brakes locked, bed alarm set, and all needs within reach. NT arriving for bladder scan at end of session.   Related information re: BP and near syncopal episode with  LPN and charge nurse to be related to PA/ MD for follow up.    Therapy Documentation Precautions:  Precautions Precautions: Fall Precaution Comments: NG tube, foley, HOB >30, NPO, B hand mitts, soft waist belt Restrictions Weight Bearing Restrictions: No RLE Weight Bearing: Weight bearing as tolerated General:    Vital Signs: Therapy Vitals Temp: 98 F (36.7 C) Temp Source: Oral Pulse Rate: 88 Resp: 18 BP: 114/76 Patient Position (if appropriate): Lying Oxygen Therapy SpO2: 99 % O2 Device: Nasal Cannula Pain:   No pain complaint this session.  Agitated Behavior Scale: TBI Observation Details Observation Environment: Pt's Room Start of observation period - Date: 07/04/21 Start of observation period - Time: 1515 End of observation period - Date: 07/04/21 End of observation period - Time: 1615 Agitated Behavior Scale (DO NOT LEAVE BLANKS) Short attention span, easy distractibility, inability to concentrate: Present to a slight degree Impulsive, impatient, low tolerance for pain or frustration: Absent Uncooperative, resistant to care, demanding: Absent Violent and/or threatening violence toward people or property: Absent Explosive and/or unpredictable anger: Absent Rocking, rubbing, moaning, or other self-stimulating behavior: Absent Pulling at tubes, restraints, etc.: Absent Wandering from treatment areas: Absent Restlessness, pacing, excessive movement: Absent Repetitive behaviors, motor, and/or verbal: Absent Rapid, loud, or excessive talking: Absent Sudden changes of mood: Absent Easily initiated or excessive crying and/or laughter: Absent Self-abusiveness, physical and/or verbal: Absent Agitated behavior scale total score: 15   Therapy/Group: Individual Therapy  Loel Dubonnet PT, DPT 07/04/2021, 4:50 PM

## 2021-07-04 NOTE — Progress Notes (Signed)
Patient teaching/Education: Patient and family was informed patient is to continue to toilet q 2-3 hours daily prior to I/0.  PA aware.

## 2021-07-04 NOTE — Progress Notes (Signed)
PROGRESS NOTE   Subjective/Complaints: Had a reasonable night.  Not eating a whole lot yet. Still not voiding yet. No dizziness or pain. Mom concerned as pt is beginning to ask about his girlfriend who was killed in accident  ROS: Patient denies fever, rash, sore throat, blurred vision, nausea, vomiting, diarrhea, cough, shortness of breath or chest pain,  headache, or mood change.     Objective:   DG Swallowing Func-Speech Pathology  Result Date: 07/03/2021 Table formatting from the original result was not included. Objective Swallowing Evaluation: Type of Study: MBS-Modified Barium Swallow Study  Patient Details Name: Gokul Waybright MRN: [id number] Date of Birth: 04-Aug-2000 Today's Date: 07/03/2021 Past Medical History: No past medical history on file. Past Surgical History: Past Surgical History: Procedure Laterality Date  IR ANGIO EXTRACRAN SEL COM CAROTID INNOMINATE UNI BILAT MOD SED  06/08/2021  IR ANGIO INTRA EXTRACRAN SEL INTERNAL CAROTID UNI R MOD SED  06/12/2021  IR ANGIO INTRA EXTRACRAN SEL INTERNAL CAROTID UNI R MOD SED  06/18/2021  IR ANGIO VERTEBRAL SEL VERTEBRAL BILAT MOD SED  06/08/2021  IR CT HEAD LTD  06/12/2021  IR CT HEAD LTD  06/18/2021  IR TRANSCATH/EMBOLIZ  06/12/2021  IR TRANSCATH/EMBOLIZ  06/18/2021  IR US GUIDE VASC ACCESS RIGHT  06/18/2021  RADIOLOGY WITH ANESTHESIA N/A 06/08/2021  Procedure: IR WITH ANESTHESIA;  Surgeon: Julieanne Cotton, MD;  Location: [facility] OR;  Service: Radiology;  Laterality: N/A;  RADIOLOGY WITH ANESTHESIA N/A 06/12/2021  Procedure: Alfredia Client;  Surgeon: Julieanne Cotton, MD;  Location: [facility] OR;  Service: Radiology;  Laterality: N/A;  RADIOLOGY WITH ANESTHESIA N/A 06/18/2021  Procedure: EMBOLIZATION;  Surgeon: Julieanne Cotton, MD;  Location: [facility] OR;  Service: Radiology;  Laterality: N/A; HPI: See H&P  No data recorded  Recommendations for follow up therapy are one component of a multi-disciplinary  discharge planning process, led by the attending physician.  Recommendations may be updated based on patient status, additional functional criteria and insurance authorization. Assessment / Plan / Recommendation Clinical Impressions 07/03/2021 Clinical Impression Patient demonstrates a moderate oral and a mild pharyngeal dysphagia. Patient with right CNVII impairment leading to decreased labial seal and intraoral pressure with intermittent slurping of boluses. Patient with decreased bolus cohesion with weak lingual manipulation and bolus propulsion resulting in premature spillage of liquids to the pyriform sinuses resulting in aspiration before and during the swallow. A chin tuck with a straw placed on the left side of his oral cavity to help facilitate positioning was successful in improving oral control and eliminating penetration and aspiration. A head turn to the right was also successful in eliminating aspiration with thin liquids via straw but patient demonstrated difficulty maintain and utilizing prior positioning consistently.  Patients moderate oral deficits also impacted efficient mastication of Dys. 2 textures. Recommend patient initiate a diet of Dys. 1 textures with nectar-thick liquids with a straw to facilitate a chin tuck. Also recommend therapeutic trails of thin liquids via straw with utilization of a head turn to the right. Patients parents educated regarding MBS results, diet recommendations, appropriate textures, compensatory strategies, medication administration and how to thicken liquids. Patients parents verbalized understanding. SLP Visit Diagnosis Dysphagia, oropharyngeal phase (R13.12) Attention and concentration deficit  following -- Frontal lobe and executive function deficit following -- Impact on safety and function Severe aspiration risk;Risk for inadequate nutrition/hydration   Treatment Recommendations 07/03/2021 Treatment Recommendations Therapy as outlined in treatment plan  below   Prognosis 07/03/2021 Prognosis for Safe Diet Advancement Good Barriers to Reach Goals -- Barriers/Prognosis Comment -- Diet Recommendations 07/03/2021 SLP Diet Recommendations Dysphagia 1 (Puree) solids;Nectar thick liquid Liquid Administration via Straw Medication Administration Crushed with puree Compensations Minimize environmental distractions;Slow rate;Small sips/bites;Chin tuck;Use straw to facilitate chin tuck;Monitor for anterior loss Postural Changes --   Other Recommendations 07/03/2021 Recommended Consults -- Oral Care Recommendations Oral care QID Other Recommendations Order thickener from pharmacy;Prohibited food (jello, ice cream, thin soups);Clarify dietary restrictions;Remove water pitcher;Have oral suction available Follow Up Recommendations Outpatient SLP Assistance recommended at discharge Set up Supervision/Assistance Functional Status Assessment -- Frequency and Duration  07/03/2021 Speech Therapy Frequency (ACUTE ONLY) min 3x week Treatment Duration 3 weeks   Oral Phase 07/03/2021 Oral Phase Impaired Oral - Pudding Teaspoon -- Oral - Pudding Cup -- Oral - Honey Teaspoon NT Oral - Honey Cup -- Oral - Nectar Teaspoon Weak lingual manipulation;Lingual pumping;Lingual/palatal residue;Delayed oral transit;Decreased bolus cohesion;Premature spillage;Right anterior bolus loss Oral - Nectar Cup Lingual pumping;Weak lingual manipulation;Lingual/palatal residue;Decreased bolus cohesion;Delayed oral transit;Reduced posterior propulsion;Premature spillage;Right anterior bolus loss Oral - Nectar Straw Decreased bolus cohesion;Delayed oral transit;Weak lingual manipulation;Reduced posterior propulsion;Premature spillage;Lingual/palatal residue;Lingual pumping;Right anterior bolus loss Oral - Thin Teaspoon Weak lingual manipulation;Lingual pumping;Delayed oral transit;Decreased bolus cohesion;Lingual/palatal residue;Premature spillage;Reduced posterior propulsion;Right anterior bolus loss Oral - Thin  Cup -- Oral - Thin Straw Reduced posterior propulsion;Lingual pumping;Weak lingual manipulation;Premature spillage;Delayed oral transit;Lingual/palatal residue;Decreased bolus cohesion;Right anterior bolus loss Oral - Puree Delayed oral transit;Decreased bolus cohesion;Premature spillage;Lingual pumping;Weak lingual manipulation;Lingual/palatal residue;Reduced posterior propulsion;Piecemeal swallowing Oral - Mech Soft Right anterior bolus loss;Impaired mastication;Right pocketing in lateral sulci;Weak lingual manipulation;Decreased bolus cohesion;Piecemeal swallowing;Lingual pumping;Delayed oral transit Oral - Regular -- Oral - Multi-Consistency -- Oral - Pill -- Oral Phase - Comment --  Pharyngeal Phase 07/03/2021 Pharyngeal Phase Impaired Pharyngeal- Pudding Teaspoon -- Pharyngeal -- Pharyngeal- Pudding Cup -- Pharyngeal -- Pharyngeal- Honey Teaspoon NT Pharyngeal -- Pharyngeal- Honey Cup -- Pharyngeal -- Pharyngeal- Nectar Teaspoon Delayed swallow initiation-pyriform sinuses;Penetration/Aspiration during swallow;Reduced airway/laryngeal closure Pharyngeal Material enters airway, remains ABOVE vocal cords and not ejected out Pharyngeal- Nectar Cup Delayed swallow initiation-pyriform sinuses;Penetration/Aspiration before swallow;Moderate aspiration Pharyngeal Material enters airway, passes BELOW cords and not ejected out despite cough attempt by patient Pharyngeal- Nectar Straw Delayed swallow initiation-pyriform sinuses;Penetration/Aspiration during swallow;Compensatory strategies attempted (with notebox) Pharyngeal Material enters airway, remains ABOVE vocal cords then ejected out Pharyngeal- Thin Teaspoon Delayed swallow initiation-pyriform sinuses;Penetration/Aspiration during swallow;Compensatory strategies attempted (with notebox) Pharyngeal Material enters airway, passes BELOW cords and not ejected out despite cough attempt by patient Pharyngeal- Thin Cup -- Pharyngeal -- Pharyngeal- Thin Straw Delayed  swallow initiation-pyriform sinuses;Penetration/Aspiration during swallow;Reduced airway/laryngeal closure;Compensatory strategies attempted (with notebox) Pharyngeal Material enters airway, passes BELOW cords and not ejected out despite cough attempt by patient;Material does not enter airway;Material enters airway, remains ABOVE vocal cords and not ejected out Pharyngeal- Puree Delayed swallow initiation-vallecula Pharyngeal -- Pharyngeal- Mechanical Soft Delayed swallow initiation-vallecula Pharyngeal -- Pharyngeal- Regular -- Pharyngeal -- Pharyngeal- Multi-consistency -- Pharyngeal -- Pharyngeal- Pill -- Pharyngeal -- Pharyngeal Comment --  Cervical Esophageal Phase  07/03/2021 Cervical Esophageal Phase WFL Pudding Teaspoon -- Pudding Cup -- Honey Teaspoon -- Honey Cup -- Nectar Teaspoon -- Nectar Cup -- Nectar Straw -- Thin Teaspoon -- Thin Cup -- Thin Straw -- Puree --  Mechanical Soft -- Regular -- Multi-consistency -- Pill -- Cervical Esophageal Comment -- PAYNE, COURTNEY 07/03/2021, 4:21 PM    Feliberto Gottron, MA, CCC-SLP                  No results for input(s): WBC, HGB, HCT, PLT in the last 72 hours.  Recent Labs    07/02/21 0510 07/04/21 0521  NA 136 134*  K 3.9 4.1  CL 108 102  CO2 20* 23  GLUCOSE 99 111*  BUN 32* 27*  CREATININE 0.69 0.69  CALCIUM 9.2 9.4    Intake/Output Summary (Last 24 hours) at 07/04/2021 1230 Last data filed at 07/04/2021 1100 Gross per 24 hour  Intake 90 ml  Output 2950 ml  Net -2860 ml     Pressure Injury 06/22/21 Ear Right Stage 2 -  Partial thickness loss of dermis presenting as a shallow open injury with a red, pink wound bed without slough. Pressure injury on cartilage of right ear from head laying on right side (Active)  06/22/21 0130  Location: Ear  Location Orientation: Right  Staging: Stage 2 -  Partial thickness loss of dermis presenting as a shallow open injury with a red, pink wound bed without slough.  Wound Description (Comments):  Pressure injury on cartilage of right ear from head laying on right side  Present on Admission: No    Physical Exam: Vital Signs Blood pressure 109/84, pulse (!) 108, temperature 98.1 F (36.7 C), temperature source Oral, resp. rate 18, height 5\' 10"  (1.778 m), weight 75 kg, SpO2 99 %. Constitutional: No distress . Vital signs reviewed. HEENT: NCAT, EOMI, oral membranes moist, NGT in place Neck: supple Cardiovascular: RRR without murmur. No JVD    Respiratory/Chest: CTA Bilaterally without wheezes or rales. Normal effort    GI/Abdomen: BS +, non-tender, non-distended Ext: no clubbing, cyanosis, or edema Psych: pleasant and cooperative, calm Skin: scattered bruises and abrasions all healing Neurologic: oriented to person, place, follows commands. Stm deficits. 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 Moves all four  Sensory exam appears to be grossly intact Musculoskeletal: Full passive range of motion in all 4 extremities. No joint swelling   Assessment/Plan: 1. Functional deficits which require 3+ hours per day of interdisciplinary therapy in a comprehensive inpatient rehab setting. Physiatrist is providing close team supervision and 24 hour management of active medical problems listed below. Physiatrist and rehab team continue to assess barriers to discharge/monitor patient progress toward functional and medical goals  Care Tool:  Bathing        Body parts bathed by helper: Right arm, Left arm, Chest, Abdomen     Bathing assist Assist Level: Supervision/Verbal cueing     Upper Body Dressing/Undressing Upper body dressing   What is the patient wearing?: Pull over shirt    Upper body assist Assist Level: Minimal Assistance - Patient > 75%    Lower Body Dressing/Undressing Lower body dressing      What is the patient  wearing?: Pants     Lower body assist Assist for lower body dressing: Minimal Assistance - Patient > 75%     Toileting Toileting    Toileting assist Assist for toileting: Moderate Assistance - Patient 50 - 74%     Transfers Chair/bed transfer  Transfers assist  Chair/bed transfer activity did not occur: Safety/medical concerns  Chair/bed transfer assist level: Minimal Assistance -  Patient > 75%     Locomotion Ambulation   Ambulation assist      Assist level: Moderate Assistance - Patient 50 - 74% Assistive device: No Device Max distance: 271   Walk 10 feet activity   Assist     Assist level: Minimal Assistance - Patient > 75% Assistive device: No Device   Walk 50 feet activity   Assist Walk 50 feet with 2 turns activity did not occur: Safety/medical concerns  Assist level: Minimal Assistance - Patient > 75% Assistive device: No Device    Walk 150 feet activity   Assist Walk 150 feet activity did not occur: Safety/medical concerns  Assist level: Moderate Assistance - Patient - 50 - 74% Assistive device: No Device    Walk 10 feet on uneven surface  activity   Assist Walk 10 feet on uneven surfaces activity did not occur: Safety/medical concerns         Wheelchair     Assist Is the patient using a wheelchair?: Yes Type of Wheelchair:  (TIS wc) Wheelchair activity did not occur: Safety/medical concerns  Wheelchair assist level: Dependent - Patient 0%      Wheelchair 50 feet with 2 turns activity    Assist    Wheelchair 50 feet with 2 turns activity did not occur: Safety/medical concerns   Assist Level: Dependent - Patient 0%   Wheelchair 150 feet activity     Assist  Wheelchair 150 feet activity did not occur: Safety/medical concerns   Assist Level: Dependent - Patient 0%   Blood pressure 109/84, pulse (!) 108, temperature 98.1 F (36.7 C), temperature source Oral, resp. rate 18, height 5\' 10"  (1.778 m), weight 75  kg, SpO2 99 %.  Medical Problem List and Plan: 1. 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  BCVI of R-ICA with pseudoaneurysm/Antithrombotics: -DVT/anticoagulation:  Pharmaceutical: Lovenox             -antiplatelet therapy: ASA/Brillinta  3. Pain Management: Gabapentin 300 mg TID w/Tylenol qid.               --oxycodone prn  -changed robaxin to prn  as mom saw it causing sedation 4. Mood/sleep-wake: Neuropsych/LCSW to follow for evaluation when appropriate.              -continue sleep chart             -contin scheduled trazodone 50mg  qhs scheduled, klonopin at hs             -hold on stimulant for now given improvement             -antipsychotic agents: seroquel  Have reduced to qhs only.  -spoke with mom re: concerns over when to tell 07/28/21 about his girlfriend. I have also discussed briefly with Dr. . He will see the patient Monday. I advised mom to try to redirect discussion when Gabe mentions GF, at least thru this Christmas weekend. At some point he'll have to be told. Dr. Tuesday will help family counsel thru process on  Monday.  5. Neuropsych: This patient is not capable of making decisions on his own behalf. 6. Skin/Wound Care: Routine pressure relief measures.  7. Fluids/Electrolytes/Nutrition: NPO with tube feeds at 75 cc/hr  --Water flushes 100 cc qid--> increase due to AKI. BUN elevated but stable , has high urine output  12/23 - on 200cc h20 boluses  -continue to push po diet and fluids, reduced TF by 50%  -today's labs shoe continued improvement of bun 8. Tachycardia: Monitor HR TID--continue Metoprolol bid               Vitals:   07/03/21 1953 07/04/21 0412  BP: 130/69 109/84  Pulse: (!) 110 (!) 108  Resp: 18 18  Temp: 97.9 F (36.6 C) 98.1 F (36.7 C)  SpO2: 99% 99%    9. Acute blood loss anemia: improved Hgb stable >12 10. Urinary retention:              --Started on Silodosin on 12/16.--dc d/t syncopal episode   12/23 continue voiding trial  -I/O cath mostly right now, large volumes  -resumed urecholine  tid   -increase to qid today 11. Hypernatremia w/AKI:               -resolved 12. Leukocytosis: gradually improving   14. Facial fractures: Follow up with Dr. Annalee Genta on outpatient basis. 15. Possible low grade aortic injury: Follow up with Dr. Lenell Antu in the future 16. 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              17,  Dysphagia- started on D1/Nectar diet per SLP  -continue NGT for now 18. Syncopal episode likely d/t prerenal azotemia. Improved after ivf bolus  -improved. Continue fluid resuscitation, pushing lfuids 19. Loose stool: generally improved  -lomotil prn  -added fiber to regimen   LOS: 7 days A FACE TO FACE EVALUATION WAS PERFORMED  Ranelle Oyster 07/04/2021, 12:30 PM

## 2021-07-05 LAB — GLUCOSE, CAPILLARY
Glucose-Capillary: 113 mg/dL — ABNORMAL HIGH (ref 70–99)
Glucose-Capillary: 115 mg/dL — ABNORMAL HIGH (ref 70–99)
Glucose-Capillary: 116 mg/dL — ABNORMAL HIGH (ref 70–99)
Glucose-Capillary: 136 mg/dL — ABNORMAL HIGH (ref 70–99)
Glucose-Capillary: 92 mg/dL (ref 70–99)

## 2021-07-05 NOTE — Progress Notes (Signed)
Occupational Therapy TBI Note  Patient Details  Name: Jason [name] MRN: [id number] Date of Birth: December 15, 2000  Today's Date: 07/05/2021 OT Missed Time: 45 Minutes Missed Time Reason: Patient fatigue   Short Term Goals: Week 1:  OT Short Term Goal 1 (Week 1): Pt will maintain static sitting balance EOB with max A of 1 for >5 min in prep for seated ADL. OT Short Term Goal 2 (Week 1): Pt will wash face with min A. OT Short Term Goal 3 (Week 1): Pt will sustain attention on ADL task for >1 min with no more than min VCs. OT Short Term Goal 4 (Week 1): Pt will don shirt with max A.  Skilled Therapeutic Interventions/Progress Updates:  Pt's mother standing outside pt's door upon arrival stating that pt was resting. Reported that pt had a fairly good night, but was up a lot today and very active in conversation and was extremely fatigued. Informed pt's mother that therapist would check in a few mins later to see if pt still asleep, with plan to assist pt and provide family education on transfer in order to incorporate/clear family with assisting. On 2nd attempt with revisiting pt, pt's mother and father standing outside, politely requesting therapist not to wake the pt due to fatigue. Pt left with father in room, and all immediate needs in reach.   Jason [name] E Jason [name] 07/05/2021, 7:59 AM

## 2021-07-05 NOTE — Progress Notes (Signed)
Speech Language Pathology TBI Note  Patient Details  Name: Jason [name] MRN: [id number] Date of Birth: 2001/05/07  Today's Date: 07/05/2021 SLP Individual Time: 5427-0623 SLP Individual Time Calculation (min): 45 min  Short Term Goals: Week 2: SLP Short Term Goal 1 (Week 2): Patient will consume current diet with minimal overt s/s of aspiration with Mod verbal cues for use of swallowing compensatory strategies. SLP Short Term Goal 2 (Week 2): Patient will consume trials of thin liquids via straw while utilizing a head turn to the right with Mod verbal cues with minimal overt s/s of aspiration over 3 sessions to assess readiness for repeat MBS and/or upgrade. SLP Short Term Goal 3 (Week 2): Patient will demonstate selective attention to functional tasks for 30 minutes in a mildly distracting enviornment with Min verbal cues for redirection. SLP Short Term Goal 4 (Week 2): Patient will utilize external aids for orientation to place, time and situation with Min verbal and visual cues. SLP Short Term Goal 5 (Week 2): Patient will demonstrate functional problem solving for basic and familiar tasks with mod verbal cues. SLP Short Term Goal 6 (Week 2): Patient will self-monitor and correct errors during functional tasks with mod verbal cues.  Skilled Therapeutic Interventions: Skilled treatment session focused on dysphagia and cognitive goals. SLP facilitated session by providing trials of ice chips with focus on masticating on the right side of his oral cavity. Mastication was prolonged and patient would often use his finger on the outside of his cheek to push the ice from his right buccal pocket. Recommend ongoing practice with focus on strength and control with mastication. SLP also facilitated session by providing exercises to maximize right labial musculature like using a forming a tight labial seal in order to produce enough force to suck a small piece of paper up from the table. Patient also  instructed on using his right labial musculature while drinking nectar-thick liquids but emphasized the importance of small sips. Patient consumed trials of thin liquids via straw with use of a head turn to the right. Patient demonstrated difficulty maintaining posture and monitoring sip size resulting in overt coughing in 25% of trials. Recommend ongoing trials with SLP. Patient requested a magic cup and required Min-Mod verbal cues for small bites and a slow rate resulting in an intermittent wet vocal quality that were reduced with throat clears. Patient mildly impulsive throughout session and required Min verbal cues for safety and attention. Patient left upright in bed with alarm on and all needs within reach. Continue with current plan of care.      Pain No/Denies Pain   Agitated Behavior Scale: TBI Observation Details Observation Environment: patient's room Start of observation period - Date: 07/05/21 Start of observation period - Time: 1030 End of observation period - Date: 07/05/21 End of observation period - Time: 1115 Agitated Behavior Scale (DO NOT LEAVE BLANKS) Short attention span, easy distractibility, inability to concentrate: Present to a slight degree Impulsive, impatient, low tolerance for pain or frustration: Absent Uncooperative, resistant to care, demanding: Absent Violent and/or threatening violence toward people or property: Absent Explosive and/or unpredictable anger: Absent Rocking, rubbing, moaning, or other self-stimulating behavior: Absent Pulling at tubes, restraints, etc.: Absent Wandering from treatment areas: Absent Restlessness, pacing, excessive movement: Absent Repetitive behaviors, motor, and/or verbal: Absent Rapid, loud, or excessive talking: Absent Sudden changes of mood: Absent Easily initiated or excessive crying and/or laughter: Absent Self-abusiveness, physical and/or verbal: Absent Agitated behavior scale total score: 15  Therapy/Group:  Individual Therapy  Manali Mcelmurry 07/05/2021, 2:10 PM

## 2021-07-05 NOTE — Progress Notes (Signed)
PROGRESS NOTE   Subjective/Complaints: Pt feeling well this morning.  Became syncopal yesterday afternoon again while going to bathroom. Still not eating much. Did eat 25% of last two meals! Was incontinent of urine this morning. Otherwise still needing to be cathed  ROS: Patient denies fever, rash, sore throat, blurred vision, nausea, vomiting, diarrhea, cough, shortness of breath or chest pain, joint or back pain, headache, or mood change.     Objective:   No results found. No results for input(s): WBC, HGB, HCT, PLT in the last 72 hours.  Recent Labs    07/04/21 0521  NA 134*  K 4.1  CL 102  CO2 23  GLUCOSE 111*  BUN 27*  CREATININE 0.69  CALCIUM 9.4    Intake/Output Summary (Last 24 hours) at 07/05/2021 1040 Last data filed at 07/05/2021 0750 Gross per 24 hour  Intake 120 ml  Output 2460 ml  Net -2340 ml     Pressure Injury 06/22/21 Ear Right Stage 2 -  Partial thickness loss of dermis presenting as a shallow open injury with a red, pink wound bed without slough. Pressure injury on cartilage of right ear from head laying on right side (Active)  06/22/21 0130  Location: Ear  Location Orientation: Right  Staging: Stage 2 -  Partial thickness loss of dermis presenting as a shallow open injury with a red, pink wound bed without slough.  Wound Description (Comments): Pressure injury on cartilage of right ear from head laying on right side  Present on Admission: No    Physical Exam: Vital Signs Blood pressure 112/69, pulse (!) 109, temperature 98.8 F (37.1 C), temperature source Oral, resp. rate 18, height 5\' 10"  (1.778 m), weight 77.3 kg, SpO2 96 %. Constitutional: No distress . Vital signs reviewed. HEENT: NCAT, EOMI, oral membranes moist, +NGT Neck: supple Cardiovascular: RRR without murmur. No JVD    Respiratory/Chest: CTA Bilaterally without wheezes or rales. Normal effort    GI/Abdomen: BS +,  non-tender, non-distended Ext: no clubbing, cyanosis, or edema Psych: pleasant and cooperative  Skin: scattered bruises and abrasions all healing Neurologic: oriented to person, place, month. Follows commands. Demonstrates some basic insight. Normal language.  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 Moves all four  Sensory exam appears to be grossly intact Musculoskeletal: Full passive range of motion in all 4 extremities. No joint swelling   Assessment/Plan: 1. Functional deficits which require 3+ hours per day of interdisciplinary therapy in a comprehensive inpatient rehab setting. Physiatrist is providing close team supervision and 24 hour management of active medical problems listed below. Physiatrist and rehab team continue to assess barriers to discharge/monitor patient progress toward functional and medical goals  Care Tool:  Bathing        Body parts bathed by helper: Right arm, Left arm, Chest, Abdomen     Bathing assist Assist Level: Supervision/Verbal cueing     Upper Body Dressing/Undressing Upper body dressing   What is the patient wearing?: Pull over shirt    Upper body assist Assist Level: Minimal Assistance - Patient >  75%    Lower Body Dressing/Undressing Lower body dressing      What is the patient wearing?: Pants     Lower body assist Assist for lower body dressing: Minimal Assistance - Patient > 75%     Toileting Toileting    Toileting assist Assist for toileting: Moderate Assistance - Patient 50 - 74%     Transfers Chair/bed transfer  Transfers assist  Chair/bed transfer activity did not occur: Safety/medical concerns  Chair/bed transfer assist level: Minimal Assistance - Patient > 75%     Locomotion Ambulation   Ambulation assist      Assist level: Moderate Assistance - Patient  50 - 74% Assistive device: No Device Max distance: 271   Walk 10 feet activity   Assist     Assist level: Minimal Assistance - Patient > 75% Assistive device: No Device   Walk 50 feet activity   Assist Walk 50 feet with 2 turns activity did not occur: Safety/medical concerns  Assist level: Minimal Assistance - Patient > 75% Assistive device: No Device    Walk 150 feet activity   Assist Walk 150 feet activity did not occur: Safety/medical concerns  Assist level: Moderate Assistance - Patient - 50 - 74% Assistive device: No Device    Walk 10 feet on uneven surface  activity   Assist Walk 10 feet on uneven surfaces activity did not occur: Safety/medical concerns         Wheelchair     Assist Is the patient using a wheelchair?: Yes Type of Wheelchair:  (TIS wc) Wheelchair activity did not occur: Safety/medical concerns  Wheelchair assist level: Dependent - Patient 0%      Wheelchair 50 feet with 2 turns activity    Assist    Wheelchair 50 feet with 2 turns activity did not occur: Safety/medical concerns   Assist Level: Dependent - Patient 0%   Wheelchair 150 feet activity     Assist  Wheelchair 150 feet activity did not occur: Safety/medical concerns   Assist Level: Dependent - Patient 0%   Blood pressure 112/69, pulse (!) 109, temperature 98.8 F (37.1 C), temperature source Oral, resp. rate 18, height 5\' 10"  (1.778 m), weight 77.3 kg, SpO2 96 %.  Medical Problem List and Plan: 1. 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  BCVI of R-ICA with  pseudoaneurysm/Antithrombotics: -DVT/anticoagulation:  Pharmaceutical: Lovenox             -antiplatelet therapy: ASA/Brillinta  3. Pain Management: Gabapentin 300 mg TID w/Tylenol qid.               --oxycodone prn  -changed robaxin to prn  as mom saw it causing sedation 4. Mood/sleep-wake: Neuropsych/LCSW to follow for evaluation when appropriate.              -continue sleep chart             -contin scheduled trazodone 50mg  qhs scheduled  12/24 -dc klonopin             -  hold on stimulant for now given improvement             -antipsychotic agents: seroquel  Have reduced to qhs only.  -have spoken with mom re: concerns over when to tell Liz [place] about his girlfriend. I have also discussed briefly with Dr. Kieth Brightly. He will see the patient Monday. I advised mom to try to redirect discussion when Gabe mentions GF, at least thru this Christmas weekend. At some point he'll have to be told. Dr. Kieth Brightly will help family counsel thru process on Monday.  5. Neuropsych: This patient is not capable of making decisions on his own behalf. 6. Skin/Wound Care: Routine pressure relief measures.  7. Fluids/Electrolytes/Nutrition: NPO with tube feeds at 75 cc/hr  --Water flushes 100 cc qid--> increase due to AKI. BUN elevated but stable , has high urine output  12/24 - increase fluid boluses to 300cc qid  -continue to push po diet and fluids, reduced TF by 50%  -recheck labs Monday  -can hold TF if he eats more than 50% for the day 8. Tachycardia: Monitor HR TID--continue Metoprolol bid              Vitals:   07/04/21 1926 07/05/21 0407  BP: (!) 108/97 112/69  Pulse: 86 (!) 109  Resp: 18 18  Temp: (!) 97.4 F (36.3 C) 98.8 F (37.1 C)  SpO2: 99% 96%    9. Acute blood loss anemia: improved Hgb stable >12 10. Urinary retention:              --Started on Silodosin on 12/16.--dc d/t syncopal episode   12/24 continue voiding trial  -still I/O cath mostly right now  -increase urecholine to 25mg   qid 12/23 11. Hypernatremia w/AKI:               -resolved 12. Leukocytosis: gradually improving   14. Facial fractures: Follow up with Dr. 1/24 on outpatient basis. 15. Possible low grade aortic injury: Follow up with Dr. Annalee Genta in the future 16. Cranial nerve III palsy: Follow up with Dr. Lenell Antu after discharge.              17,  Dysphagia- started on D1/Nectar diet per SLP  -see #7 18. Syncopal episode (x 2) likely d/t prerenal azotemia.   -received IVF earlier in week  -dc'ed norvasc  -may need to decrease/stop metoprolol also but HR in 90's 100's  -TEDS/ABD binder when up  -increase H20 flushes, recheck labs monday 19. Loose stool: generally improved  -lomotil prn  -added fiber to regimen   LOS: 8 days A FACE TO FACE EVALUATION WAS PERFORMED  Celene Skeen 07/05/2021, 10:40 AM

## 2021-07-06 LAB — GLUCOSE, CAPILLARY
Glucose-Capillary: 105 mg/dL — ABNORMAL HIGH (ref 70–99)
Glucose-Capillary: 79 mg/dL (ref 70–99)
Glucose-Capillary: 117 mg/dL — ABNORMAL HIGH (ref 70–99)

## 2021-07-06 MED ORDER — BETHANECHOL CHLORIDE 25 MG PO TABS
50.0000 mg | ORAL_TABLET | Freq: Four times a day (QID) | ORAL | Status: DC
  Administered 2021-07-06 – 2021-07-09 (×13): 50 mg via ORAL
  Filled 2021-07-06 (×13): qty 2

## 2021-07-06 MED ORDER — METOPROLOL TARTRATE 25 MG PO TABS
25.0000 mg | ORAL_TABLET | Freq: Two times a day (BID) | ORAL | Status: DC
  Administered 2021-07-06 – 2021-07-14 (×16): 25 mg via ORAL
  Filled 2021-07-06 (×16): qty 1

## 2021-07-06 NOTE — Progress Notes (Signed)
PROGRESS NOTE   Subjective/Complaints: Pt is awake and alert. No complaints. Wished me merry christmas! Still needing caths. Some incontinence  ROS: Patient denies fever, rash, sore throat, blurred vision, nausea, vomiting, diarrhea, cough, shortness of breath or chest pain, joint or back pain, headache, or mood change.     Objective:   No results found. No results for input(s): WBC, HGB, HCT, PLT in the last 72 hours.  Recent Labs    07/04/21 0521  NA 134*  K 4.1  CL 102  CO2 23  GLUCOSE 111*  BUN 27*  CREATININE 0.69  CALCIUM 9.4    Intake/Output Summary (Last 24 hours) at 07/06/2021 8016 Last data filed at 07/06/2021 0500 Gross per 24 hour  Intake --  Output 2000 ml  Net -2000 ml     Pressure Injury 06/22/21 Ear Right Stage 2 -  Partial thickness loss of dermis presenting as a shallow open injury with a red, pink wound bed without slough. Pressure injury on cartilage of right ear from head laying on right side (Active)  06/22/21 0130  Location: Ear  Location Orientation: Right  Staging: Stage 2 -  Partial thickness loss of dermis presenting as a shallow open injury with a red, pink wound bed without slough.  Wound Description (Comments): Pressure injury on cartilage of right ear from head laying on right side  Present on Admission: No    Physical Exam: Vital Signs Blood pressure 112/69, pulse 92, temperature 98.2 F (36.8 C), temperature source Oral, resp. rate 16, height 5\' 10"  (1.778 m), weight 76.4 kg, SpO2 99 %. Constitutional: No distress . Vital signs reviewed. HEENT: NCAT, EOMI, oral membranes moist Neck: supple Cardiovascular: RRR without murmur. No JVD    Respiratory/Chest: CTA Bilaterally without wheezes or rales. Normal effort    GI/Abdomen: BS +, non-tender, non-distended Ext: no clubbing, cyanosis, or edema Psych: pleasant and cooperative  Skin: scattered bruises and abrasions all healing  including ear Neurologic: oriented to person, place, month. Follows commands. Demonstrates some basic insight. Normal language.  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 Moves all four  Sensory exam appears to be grossly intact Musculoskeletal: Full passive range of motion in all 4 extremities. No joint swelling   Assessment/Plan: 1. Functional deficits which require 3+ hours per day of interdisciplinary therapy in a comprehensive inpatient rehab setting. Physiatrist is providing close team supervision and 24 hour management of active medical problems listed below. Physiatrist and rehab team continue to assess barriers to discharge/monitor patient progress toward functional and medical goals  Care Tool:  Bathing        Body parts bathed by helper: Right arm, Left arm, Chest, Abdomen     Bathing assist Assist Level: Supervision/Verbal cueing     Upper Body Dressing/Undressing Upper body dressing   What is the patient wearing?: Pull over shirt    Upper body assist Assist Level: Minimal Assistance - Patient > 75%    Lower Body Dressing/Undressing Lower body dressing      What is the patient wearing?: Pants  Lower body assist Assist for lower body dressing: Minimal Assistance - Patient > 75%     Toileting Toileting    Toileting assist Assist for toileting: Moderate Assistance - Patient 50 - 74%     Transfers Chair/bed transfer  Transfers assist  Chair/bed transfer activity did not occur: Safety/medical concerns  Chair/bed transfer assist level: Minimal Assistance - Patient > 75%     Locomotion Ambulation   Ambulation assist      Assist level: Moderate Assistance - Patient 50 - 74% Assistive device: No Device Max distance: 271   Walk 10 feet activity   Assist     Assist level: Minimal Assistance -  Patient > 75% Assistive device: No Device   Walk 50 feet activity   Assist Walk 50 feet with 2 turns activity did not occur: Safety/medical concerns  Assist level: Minimal Assistance - Patient > 75% Assistive device: No Device    Walk 150 feet activity   Assist Walk 150 feet activity did not occur: Safety/medical concerns  Assist level: Moderate Assistance - Patient - 50 - 74% Assistive device: No Device    Walk 10 feet on uneven surface  activity   Assist Walk 10 feet on uneven surfaces activity did not occur: Safety/medical concerns         Wheelchair     Assist Is the patient using a wheelchair?: Yes Type of Wheelchair:  (TIS wc) Wheelchair activity did not occur: Safety/medical concerns  Wheelchair assist level: Dependent - Patient 0%      Wheelchair 50 feet with 2 turns activity    Assist    Wheelchair 50 feet with 2 turns activity did not occur: Safety/medical concerns   Assist Level: Dependent - Patient 0%   Wheelchair 150 feet activity     Assist  Wheelchair 150 feet activity did not occur: Safety/medical concerns   Assist Level: Dependent - Patient 0%   Blood pressure 112/69, pulse 92, temperature 98.2 F (36.8 C), temperature source Oral, resp. rate 16, height 5\' 10"  (1.778 m), weight 76.4 kg, SpO2 99 %.  Medical Problem List and Plan: 1. 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  BCVI of R-ICA with pseudoaneurysm/Antithrombotics: -DVT/anticoagulation:  Pharmaceutical: Lovenox             -antiplatelet therapy: ASA/Brillinta  3. Pain Management: Gabapentin 300 mg  TID w/Tylenol qid.               --oxycodone prn  -changed robaxin to prn  as mom saw it causing sedation 4. Mood/sleep-wake: Neuropsych/LCSW to follow for evaluation when appropriate.              -continue sleep chart             -contin scheduled trazodone 50mg  qhs scheduled  12/24 -dc'ed klonopin             -hold on stimulant for now given improvement             -antipsychotic agents: seroquel. Reduce to 12.5mg   qhs.  -have spoken with mom re: concerns over when to tell [place] about his girlfriend who passed away in accident. I have also discussed briefly with Dr. Kieth Brightly. He will see the patient Monday. I advised mom to try to redirect discussion when Gabe mentions GF, at least thru this Christmas weekend. At some point he'll have to be told. Dr. Kieth Brightly will help family counsel thru process on Monday.  5. Neuropsych: This patient is not capable of making decisions on his own behalf. 6. Skin/Wound Care: Routine pressure relief measures.  7. Fluids/Electrolytes/Nutrition: NPO with tube feeds at 75 cc/hr  --Water flushes 100 cc qid--> increase due to AKI. BUN elevated but stable , has high urine output  12/25 - still not eating a whole lot yet -continue fluid boluses at 300cc qid  -continue to push po diet and fluids, reduced TF by 50%  -recheck labs Monday  -can hold TF if he eats more than 50% for the day 8. Tachycardia: Monitor HR TID--continue Metoprolol bid. See below              Vitals:   07/05/21 1926 07/06/21 0410  BP: 117/74 112/69  Pulse: 85 92  Resp: 16 16  Temp: 98 F (36.7 C) 98.2 F (36.8 C)  SpO2: 99% 99%    9. Acute blood loss anemia: improved Hgb stable >12 10. Urinary retention:              --Started on Silodosin on 12/16.--dc d/t syncopal episode   12/25 continue voiding trial  -still I/O cath mostly right now  -increase urecholine to 50 mg qid   11. Hypernatremia w/AKI:               -resolved 12. Leukocytosis: gradually improving   14. Facial  fractures: Follow up with Dr. Annalee Genta on outpatient basis. 15. Possible low grade aortic injury: Follow up with Dr. Lenell Antu in the future 16. 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              17,  Dysphagia- started on D1/Nectar diet per SLP  -see #7 18. Syncopal episode (x 2) likely d/t prerenal azotemia.   -received IVF earlier in week  -dc'ed norvasc  -TEDS/ABD binder when up  -increased H20 flushes, recheck labs Monday  -12/25 will try reducing metoprolol to 25mg  bid 19. Loose stool: generally improved  -lomotil prn  -added fiber to regimen   LOS: 9 days A FACE TO FACE EVALUATION WAS PERFORMED  07/06/2021, 8:19 AM

## 2021-07-07 LAB — BASIC METABOLIC PANEL
Anion gap: 11 (ref 5–15)
BUN: 26 mg/dL — ABNORMAL HIGH (ref 6–20)
CO2: 26 mmol/L (ref 22–32)
Calcium: 9.3 mg/dL (ref 8.9–10.3)
Chloride: 99 mmol/L (ref 98–111)
Creatinine, Ser: 0.77 mg/dL (ref 0.61–1.24)
GFR, Estimated: >60 mL/min (ref >60)
Glucose, Bld: 103 mg/dL — ABNORMAL HIGH (ref 70–99)
Potassium: 4.1 mmol/L (ref 3.5–5.1)
Sodium: 136 mmol/L (ref 135–145)

## 2021-07-07 LAB — CBC
HCT: 34 % — ABNORMAL LOW (ref 39.0–52.0)
Hemoglobin: 11.3 g/dL — ABNORMAL LOW (ref 13.0–17.0)
MCH: 29.1 pg (ref 26.0–34.0)
MCHC: 33.2 g/dL (ref 30.0–36.0)
MCV: 87.6 fL (ref 80.0–100.0)
Platelets: 336 10*3/uL (ref 150–400)
RBC: 3.88 MIL/uL — ABNORMAL LOW (ref 4.22–5.81)
RDW: 13.8 % (ref 11.5–15.5)
WBC: 9.7 10*3/uL (ref 4.0–10.5)
nRBC: 0 % (ref 0.0–0.2)

## 2021-07-07 LAB — GLUCOSE, CAPILLARY
Glucose-Capillary: 119 mg/dL — ABNORMAL HIGH (ref 70–99)
Glucose-Capillary: 98 mg/dL (ref 70–99)

## 2021-07-07 NOTE — Progress Notes (Addendum)
Speech Language Pathology TBI Note  Patient Details  Name: Jason [name] MRN: [id number] Date of Birth: 09-Oct-2000  Today's Date: 07/07/2021 SLP Individual Time: 5638-7564 SLP Individual Time Calculation (min): 57 min  Short Term Goals: Week 2: SLP Short Term Goal 1 (Week 2): Patient will consume current diet with minimal overt s/s of aspiration with Mod verbal cues for use of swallowing compensatory strategies. SLP Short Term Goal 2 (Week 2): Patient will consume trials of thin liquids via straw while utilizing a head turn to the right with Mod verbal cues with minimal overt s/s of aspiration over 3 sessions to assess readiness for repeat MBS and/or upgrade. SLP Short Term Goal 3 (Week 2): Patient will demonstate selective attention to functional tasks for 30 minutes in a mildly distracting enviornment with Min verbal cues for redirection. SLP Short Term Goal 4 (Week 2): Patient will utilize external aids for orientation to place, time and situation with Min verbal and visual cues. SLP Short Term Goal 5 (Week 2): Patient will demonstrate functional problem solving for basic and familiar tasks with mod verbal cues. SLP Short Term Goal 6 (Week 2): Patient will self-monitor and correct errors during functional tasks with mod verbal cues.  Skilled Therapeutic Interventions: Pt seen for skilled ST with focus on swallowing and cognitive goals, pt parents present throughout. Pt coming to EOB for diet trials, re-educated on swallow strategies and recommendations per MBS and primary treating SLP. Pt tolerating 4 oz thin liquid via straw with Min fading to Supervision A verbal cues for head turn to the R.  With both thin and NTLs, pt demonstrating only 2 episodes throat clear, both as a result of incomplete head turn. Head turn appears to eliminate overt s/s aspiration, pt with increased independence and carryover of strategy this date. Pt also tolerating Dys 2 trials (soft muffin) with minimal oral  stasis/R pocketing. Pt benefiting from Supervision A cues for lingual sweep to clear residue. ~Half way through muffin trial, pt began to state "that was a big bite" however bite sizes were regulated by SLP with no change. Pt began to endorse feeling fatigued and full, trials halted per pt preference. SLP facilitating selective attention task with mildly distracting environment (parents + SLP engaged in room) by providing min A verbal cues for redirection to prompt. Pt encouraged to complete labial strengthening tasks (blowing/sucking paper through straw) which parents agree to assist with. Pt left in bed with alarm set and parents present for needs. Cont ST POC.   Pain Pain Assessment Pain Scale: 0-10 Pain Score: 0-No pain  Agitated Behavior Scale: TBI Observation Details Observation Environment: pt room Start of observation period - Date: 07/07/21 Start of observation period - Time: 1015 End of observation period - Date: 07/07/21 End of observation period - Time: 1115 Agitated Behavior Scale (DO NOT LEAVE BLANKS) Short attention span, easy distractibility, inability to concentrate: Present to a slight degree Impulsive, impatient, low tolerance for pain or frustration: Absent Uncooperative, resistant to care, demanding: Absent Violent and/or threatening violence toward people or property: Absent Explosive and/or unpredictable anger: Absent Rocking, rubbing, moaning, or other self-stimulating behavior: Absent Pulling at tubes, restraints, etc.: Absent Wandering from treatment areas: Absent Restlessness, pacing, excessive movement: Absent Repetitive behaviors, motor, and/or verbal: Absent Rapid, loud, or excessive talking: Absent Sudden changes of mood: Absent Easily initiated or excessive crying and/or laughter: Absent Self-abusiveness, physical and/or verbal: Absent Agitated behavior scale total score: 15  Therapy/Group: Individual Therapy  Tacey Ruiz 07/07/2021, 12:30 PM

## 2021-07-07 NOTE — Progress Notes (Signed)
Physical Therapy Weekly Progress Note  Patient Details  Name: Keiton Cosma MRN: [id number] Date of Birth: [birth year]  Beginning of progress report period: June 28, 2021 End of progress report period: July 07, 2021  Today's Date: 07/07/2021  Patient has met 3 of 3 short term goals.  Patient demonstrating excellent progress this week. Patient with appropriate and increased verbalization this week, does demonstrate mild frustration tolerance at appropriate times with mobility challenges. Patient continues to be limited by hypotension and elevated HR, improved with B thigh high TED hose donned during sessions and med management. He currently is performing bed mobility with supervision, transfers with CGA-min A, gait 1100 feet with min A-CGA without an AD, and 4 steps with B rails with min A and +2 SBA for safety. Berg Balance Scale 33/56 today, indicates high fall risk. Have initiated family education during sessions as they are present most of the day. Patient's parents are cleared for bed<>w/c or BSC transfers and standing EOB for patient to void for improved bladder emptying and reduced in/out catheterizations.   Patient continues to demonstrate the following deficits muscle weakness, decreased cardiorespiratoy endurance, decreased visual acuity and decreased visual motor skills, decreased initiation, decreased attention, decreased awareness, decreased problem solving, decreased memory, and delayed processing, and decreased sitting balance, decreased standing balance, decreased postural control, hemiplegia, and decreased balance strategies and therefore will continue to benefit from skilled PT intervention to increase functional independence with mobility.  Patient progressing toward long term goals..  Continue plan of care.  PT Short Term Goals Week 1:  PT Short Term Goal 1 (Week 1): Patient will perform bed mobility with min A >50% of the time with or without hospital bed features. PT  Short Term Goal 1 - Progress (Week 1): Met PT Short Term Goal 2 (Week 1): Patient will perform basic transfers with min A >50% of the time using LRAD. PT Short Term Goal 2 - Progress (Week 1): Met PT Short Term Goal 3 (Week 1): Patient will ambulate >50 feet using LRAD with mod A. PT Short Term Goal 3 - Progress (Week 1): Met Week 2:  PT Short Term Goal 1 (Week 2): Patient will improved Berg Balance Scale score by 8 points to meet MCID. PT Short Term Goal 2 (Week 2): Patient will perform basic transfers with CGA consistently. PT Short Term Goal 3 (Week 2): Patient will ambulate >1300 feet on 6 Min Walk Test to meet MCID with CGA. PT Short Term Goal 4 (Week 2): Patient will be able to complete 5 Time Sit to Stand without use of upper extremities for score >0.  Therapy Documentation Precautions:  Precautions Precautions: Fall Precaution Comments: NG tube, HOB >30, NPO, soft waist belt Restrictions Weight Bearing Restrictions: No  Therapy/Group: Individual Therapy  Joydan Gretzinger L Abrey Bradway PT, DPT  07/07/2021, 4:02 PM

## 2021-07-07 NOTE — Progress Notes (Signed)
Occupational Therapy Weekly Progress Note  Patient Details  Name: Jason [name] MRN: [id number] Date of Birth: 09-22-2000  Beginning of progress report period: 06/28/21 End of progress report period: 07/07/21  Today's Date: 07/07/2021 OT Individual Time: 1115-1200 OT Individual Time Calculation (min): 45 min    Patient has met 4 of 4 short term goals. Jason [name] has made excellent progress his first week in rehab. He has progressed physically to only needing min A with UB dressing and min-mod A for LB dressing. He has improved in thoroughness with bathing and initiation overall. His mom and step-dad are present every session and very support/involved in education continuously. His behaviors are consistent with a RLAS VI-VII.   Patient continues to demonstrate the following deficits: muscle weakness, decreased cardiorespiratoy endurance, decreased visual acuity, decreased initiation, decreased attention, decreased awareness, decreased problem solving, decreased safety awareness, decreased memory, and delayed processing, and decreased standing balance, decreased postural control, and decreased balance strategies and therefore will continue to benefit from skilled OT intervention to enhance overall performance with BADL and iADL.  Patient progressing toward long term goals..  Continue plan of care.  OT Short Term Goals Week 1:  OT Short Term Goal 1 (Week 1): Pt will maintain static sitting balance EOB with max A of 1 for >5 min in prep for seated ADL. OT Short Term Goal 1 - Progress (Week 1): Met OT Short Term Goal 2 (Week 1): Pt will wash face with min A. OT Short Term Goal 2 - Progress (Week 1): Met OT Short Term Goal 3 (Week 1): Pt will sustain attention on ADL task for >1 min with no more than min VCs. OT Short Term Goal 3 - Progress (Week 1): Met OT Short Term Goal 4 (Week 1): Pt will don shirt with max A. OT Short Term Goal 4 - Progress (Week 1): Met Week 2:  OT Short Term Goal 1 (Week 2):  Pt will sequence LB dressing with no more than mod A/cueing OT Short Term Goal 2 (Week 2): Pt will sustain attention to challenging cognitive task for 1 min with no more than min cueing OT Short Term Goal 3 (Week 2): Pt will require no more than min cueing for thoroughness with bathing OT Short Term Goal 4 (Week 2): Pt will complete toilet transfer with min A  Skilled Therapeutic Interventions/Progress Updates:    Pt received supine with no c/o pain, agreeable to take shower this session. BP assessed throughout session with initial drop from 122/71 to 101/66 and then all remaining BP were around the second reading with no significant change. Pt had one instance of feeling light headed but was able to tolerate BP in standing with no orthostatic result. Pt completed ambulatory transfer to the bathroom with mod HHA. He required cueing for bathing initiation and sequencing. He returned to the EOB with min A. He donned shirt with min A and pant with mod A. Improved awareness/impulse control this session overall. He completed laundry task- used w/c for transport d/t fatigue. He stood at the washing machine and loaded his clothes in with min balance support and min cueing for pressing correct buttons. He returned to his room and was left sitting up with all needs met, parents present.   Therapy Documentation Precautions:  Precautions Precautions: Fall Precaution Comments: NG tube, foley, HOB >30, NPO, B hand mitts, soft waist belt Restrictions Weight Bearing Restrictions: No RLE Weight Bearing: Weight bearing as tolerated    Therapy/Group: Individual Therapy  Sanda Klein  Kao Conry 07/07/2021, 6:51 AM
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
## 2021-07-07 NOTE — Progress Notes (Signed)
Physical Therapy TBI Note  Patient Details  Name: Jason [name] MRN: [id number] Date of Birth: [birth year]  Today's Date: 07/07/2021 PT Individual Time: 0915-1005 and 1300-1400 PT Individual Time Calculation (min): 50 min and 60 min   Short Term Goals: Week 2:  PT Short Term Goal 1 (Week 2): Patient will improved Berg Balance Scale score by 8 points to meet MCID. PT Short Term Goal 2 (Week 2): Patient will perform basic transfers with CGA consistently. PT Short Term Goal 3 (Week 2): Patient will ambulate >1300 feet on 6 Min Walk Test to meet MCID with CGA. PT Short Term Goal 4 (Week 2): Patient will be able to complete 5 Time Sit to Stand without use of upper extremities for score >0.  Skilled Therapeutic Interventions/Progress Updates:     Session 1: Patient in bed with his parents and RN at bedside upon PT arrival. Patient alert and agreeable to PT session. Patient denied pain during session.  Focused session on vital assessment with postural changes and patient/family education and hands-on transfer training. Patient symptomatic with elevated HR in standing. Morning medication administered by RN at beginning of session. HR improved with increased time and upright sitting/standing activity.   B thigh high TED hose donned prior to mobility with total A by patient's mother.   Orthostatic Vitals: Supine: HR 80's-90's  Sitting: BP 122/76, HR 103 (symptomatic x10-20 sec then resolved completely) Standing: BP 110/82, HR 147 (symptomatic requesting to sit), HR on second trial read 244 bpm on monitor and maintained >220 x10 sec with good waveform and patient reporting significant symptom, patient returned to lying with HR recovering to 80's in <60 sec and lying rest break provided before mobility attempted again, performed manual HR assessment on third trial with 142 bpm counted x60 sec and patient with moderate symptoms RN and PA made aware of vitals during session.   Therapeutic  Activity: Bed Mobility: Patient performed supine to/from sit with supervision x3. Provided verbal cues for slow movements to reduce symptoms with positional changes. Transfers: Patient performed sit to/from stand x3 and stand pivot x1 with CGA-min A from family to during transfers and to maintain standing balance during vitals assessment. Provided verbal cues for forward weight shift and controlled movements for improved postural control.  Cleared patient's parents, Joni Reining and Loraine Leriche, for sit to stand and stand pivot transfers bed<>TIS w/c and bed<>BSC. Cleared his parents for standing patient EOB to void in standing for improved bladder emptying, no ambulation or standing to void in the bathroom with his parents at this time for safety due to symptomatic hypotension and tachycardia in standing, patient and parents in agreement. Educated patient and his family symptom management with positional changes; including donning TED hose prior to mobility, moving slowly from supine to sit with HOB elevated to prevent significant positional change, sitting EOB 2-3 min before standing for improved upright tolerance/regulation, and monitoring symptoms in standing. Patient appropriately communicated symptoms at time of onset each time during session, encouraged this reporting at all times, patient stated understanding.   Also discussed removal of Telesitter and soft waist belt restraint with RN, patient, and his parents. All report that the patient has not demonstrated any impulsive behaviors with day or night shift and patient able to teach back that he should call for assistance to get up and demonstrate how to use the call bell. Charge nurse made aware of removal of Telesitter and restraints at end of session.   Patient in TIS w/c with his parents in  the room at end of session with breaks locked and all needs within reach.   Session 2: Patient in bed with his parents at bedside upon PT arrival. Patient alert and  agreeable to PT session. Patient denied pain during session. Patient eager to walk and motivated by his progress throughout session.   Patient presents with decreased frustration tolerance with R visual deficits due to ptosis and blurred vision in his R eye. Declined use of taped glasses this session, stated "they don't really help." Patient demonstrated mild increase in occular ROM in R eye in all directions, unable to maintain out of midline >2 sec.   Therapeutic Activity: Bed Mobility: Patient performed supine to/from sit with supervision. Provided verbal cues for slow controlled movements for safety. Patient donned tennis shoes with set-up A sitting EOB.  Transfers: Patient performed sit to/from stand x8 with close supervision for safety. Provided verbal cues for forward weight shift and controlled descent x2. Patient stood to switch his laundry from the washer to the drying. Able to complete task without errors and recognize that there were no dryer sheets before starting the drying with min cues for use due to patient being unfamiliar with the machine.   Gait Training:  Patient ambulated >100 feet x2, HR 90-110 after, without an AD with CGA-min A. Ambulated with ataxic gait with variable foot placement, mild scissoring during turns, decreased gait speed, step length and height L>R, and narrow BOS. Provided verbal cues for increased base of support, consistent foot placement, and increased gait speed and arm swing for improved balance without an AD. 6 Min Walk Test:  Instructed patient to ambulate as quickly and as safely as possible for 6 minutes using LRAD. Patient was allowed to take standing rest breaks without stopping the test, but if the patient required a sitting rest break the clock would be stopped and the test would be over.  Results: 1100 feet in 5 min 2 sec before requesting a seated rest break (335.3 meters, Avg speed 1.1 m/s). HR 107, RPE 5/10 after   Results indicate that the  patient has reduced endurance with ambulation compared to age matched norms (Males <21 y.o.= 572 meters).   Neuromuscular Re-ed: Patient performed the following Balance Assessments and postural control activities: -sit to stand with upper extremity use 2x10 with close supervision and focus on controlled mobility and stabilization once in standing -standing alternating bicep curls with 6lb hand weights x10 for self initiated perturbations in standing with supervision for balance  Berg Balance Test Sit to Stand: Able to stand without using hands and stabilize independently Standing Unsupported: Able to stand safely 2 minutes Sitting with Back Unsupported but Feet Supported on Floor or Stool: Able to sit safely and securely 2 minutes Stand to Sit: Controls descent by using hands Transfers: Needs one person to assist Standing Unsupported with Eyes Closed: Able to stand 10 seconds with supervision Standing Ubsupported with Feet Together: Needs help to attain position and unable to hold for 15 seconds From Standing, Reach Forward with Outstretched Arm: Reaches forward but needs supervision From Standing Position, Pick up Object from Floor: Able to pick up shoe, needs supervision From Standing Position, Turn to Look Behind Over each Shoulder: Looks behind from both sides and weight shifts well Turn 360 Degrees: Needs close supervision or verbal cueing Standing Unsupported, Alternately Place Feet on Step/Stool: Able to complete >2 steps/needs minimal assist Standing Unsupported, One Foot in Front: Able to plae foot ahead of the other independently and hold  30 seconds Standing on One Leg: Tries to lift leg/unable to hold 3 seconds but remains standing independently Total Score: 33/56 Patient demonstrates increased fall risk as noted by score of 33/56 on Berg Balance Scale.  (<36= high risk for falls, close to 100%; 37-45 significant >80%; 46-51 moderate >50%; 52-55 lower >25%) Five times Sit to Stand  Test (FTSS) Method: Use a straight back chair with a solid seat that is 16-18 high. Ask participant to sit on the chair with arms folded across their chest.   Instructions: Stand up and sit down as quickly as possible 5 times, keeping your arms folded across your chest.   Measurement: Stop timing when the participant stands the 5th time.  TIME: __0 score____  only able to stand x1 without upper extremity support, LOB with increased speed challenge to stand, will re-attempt next week.   Times > 13.6 seconds is associated with increased disability and morbidity (Guralnik, 2000) Times > 15 seconds is predictive of recurrent falls in healthy individuals aged 83 and older (Buatois, et al., 2008) Normal performance values in community dwelling individuals aged 63 and older (Bohannon, 2006): 60-69 years: 11.4 seconds 70-79 years: 12.6 seconds 80-89 years: 14.8 seconds  MCID: ? 2.3 seconds for Vestibular Disorders (Meretta, 2006)  Patient in bed with his mother at bedside at end of session with breaks locked, bed alarm set, and all needs within reach.   Therapy Documentation Precautions:  Precautions Precautions: Fall Precaution Comments: NG tube Restrictions Weight Bearing Restrictions: No RLE Weight Bearing: Weight bearing as tolerated Agitated Behavior Scale: TBI Observation Details Observation Environment: CIR Start of observation period - Date: 07/07/21 Start of observation period - Time: 0915 End of observation period - Date: 07/07/21 End of observation period - Time: 1005 Agitated Behavior Scale (DO NOT LEAVE BLANKS) Short attention span, easy distractibility, inability to concentrate: Present to a slight degree Impulsive, impatient, low tolerance for pain or frustration: Absent Uncooperative, resistant to care, demanding: Absent Violent and/or threatening violence toward people or property: Absent Explosive and/or unpredictable anger: Absent Rocking, rubbing, moaning, or  other self-stimulating behavior: Absent Pulling at tubes, restraints, etc.: Absent Wandering from treatment areas: Absent Restlessness, pacing, excessive movement: Absent Repetitive behaviors, motor, and/or verbal: Absent Rapid, loud, or excessive talking: Absent Sudden changes of mood: Absent Easily initiated or excessive crying and/or laughter: Absent Self-abusiveness, physical and/or verbal: Absent Agitated behavior scale total score: 15 Agitated Behavior Scale: TBI Observation Details Observation Environment: CIR Start of observation period - Date: 07/07/21 Start of observation period - Time: 1300 End of observation period - Date: 07/07/21 End of observation period - Time: 1400 Agitated Behavior Scale (DO NOT LEAVE BLANKS) Short attention span, easy distractibility, inability to concentrate: Present to a slight degree Impulsive, impatient, low tolerance for pain or frustration: Absent Uncooperative, resistant to care, demanding: Absent Violent and/or threatening violence toward people or property: Absent Explosive and/or unpredictable anger: Absent Rocking, rubbing, moaning, or other self-stimulating behavior: Absent Pulling at tubes, restraints, etc.: Absent Wandering from treatment areas: Absent Restlessness, pacing, excessive movement: Absent Repetitive behaviors, motor, and/or verbal: Absent Rapid, loud, or excessive talking: Absent Sudden changes of mood: Absent Easily initiated or excessive crying and/or laughter: Absent Self-abusiveness, physical and/or verbal: Absent Agitated behavior scale total score: 15    Therapy/Group: Individual Therapy  Shaylinn Hladik L Harlen Danford PT, DPT  07/07/2021, 4:08 PM

## 2021-07-08 NOTE — Progress Notes (Signed)
Speech Language Pathology TBI Note  Patient Details  Name: Jason [name] MRN: [id number] Date of Birth: 05/25/2001  Today's Date: 07/08/2021 SLP Individual Time: 4098-1191 and 1210-1222 SLP Individual Time Calculation (min): 42 min and 12 minutes  Short Term Goals: Week 2: SLP Short Term Goal 1 (Week 2): Patient will consume current diet with minimal overt s/s of aspiration with Mod verbal cues for use of swallowing compensatory strategies. SLP Short Term Goal 2 (Week 2): Patient will consume trials of thin liquids via straw while utilizing a head turn to the right with Mod verbal cues with minimal overt s/s of aspiration over 3 sessions to assess readiness for repeat MBS and/or upgrade. SLP Short Term Goal 3 (Week 2): Patient will demonstate selective attention to functional tasks for 30 minutes in a mildly distracting enviornment with Min verbal cues for redirection. SLP Short Term Goal 4 (Week 2): Patient will utilize external aids for orientation to place, time and situation with Min verbal and visual cues. SLP Short Term Goal 5 (Week 2): Patient will demonstrate functional problem solving for basic and familiar tasks with mod verbal cues. SLP Short Term Goal 6 (Week 2): Patient will self-monitor and correct errors during functional tasks with mod verbal cues.  Skilled Therapeutic Interventions:  Session 1: Skilled ST treatment focused on swallowing and cognitive goals. Patient was accompanied by his parents this date. Patient agreeable to perform therapeutic PO trials from EOB. Pt benefiting from sup A verbal redirection for attention. He consumed dysphagia 2 textures (soft muffin) with mildly prolonged yet effective mastication and minimal oral residue that he effectively cleared with lingual sweep and nectar thick liquid rinse with sup A cues. Patient required sup A cues to consistently implement chin down posture with nectar thick liquids via straw. Patient did not exhibit any overt s/s  of aspiration with both dys 2 textures and NTL. Recommend dys 2 trial tray prior to diet advancement. Pt and family in agreement. Patient then performed oral care from EOB with set-up A. Following oral care he consumed single sips of thin liquids (water only) by straw with sup A cues to achieve full head turn to R. Patient exhibited immediate cough response during 2/7 trials. Patient eventually requested to discontinue trials due to feeling full. Plan for dys 2 trial tray this afternoon. Patient was left in bed and accompanied by nurse at end of session. Continue per current plan of care.    Session 2: Patient seen at EOB for dysphagia 2 trial tray. Patient pleased with Cortrak removal just before session. Patient consumed dys 2 textures with adequate oral prep, trace oral residue, and no overt s/sx of aspiration. Pt encouraged to clear oral cavity prior to consuming NTL. Patient was very cautious with rate of consumption and implemented safe swallowing strategies with sup A verbal cues. Patient's mom also present and provided verbal reinforcement throughout meal. Recommend diet advancement to dysphagia 2, NTL at this time. Both pt and family in agreement. Patient was left EOB with bed alarm activated and immediate needs within reach. Continue per current plan of care.     Pain  No pain  Agitated Behavior Scale: TBI  Observation Details Observation Environment: CIR Start of observation period - Date: 07/08/21 Start of observation period - Time: 0800  End of observation period - Date: 07/08/21 End of observation period - Time: 0845 Agitated Behavior Scale (DO NOT LEAVE BLANKS) Short attention span, easy distractibility, inability to concentrate: Present to a slight degree Impulsive, impatient, low  tolerance for pain or frustration: Absent Uncooperative, resistant to care, demanding: Absent Violent and/or threatening violence toward people or property: Absent Explosive and/or unpredictable anger:  Absent Rocking, rubbing, moaning, or other self-stimulating behavior: Absent Pulling at tubes, restraints, etc.: Absent Wandering from treatment areas: Absent Restlessness, pacing, excessive movement: Absent Repetitive behaviors, motor, and/or verbal: Absent Rapid, loud, or excessive talking: Absent Sudden changes of mood: Absent Easily initiated or excessive crying and/or laughter: Absent Self-abusiveness, physical and/or verbal: Absent Agitated behavior scale total score: 15   Therapy/Group: Individual Therapy  Tamala Ser 07/08/2021, 4:19 PM

## 2021-07-08 NOTE — Progress Notes (Signed)
Occupational Therapy Session Note  Patient Details  Name: Jason [name] MRN: [id number] Date of Birth: February 25, 2001  Today's Date: 07/08/2021 OT Individual Time: 4818-5631 OT Individual Time Calculation (min): 65 min    Short Term Goals: Week 1:  OT Short Term Goal 1 (Week 1): Pt will maintain static sitting balance EOB with max A of 1 for >5 min in prep for seated ADL. OT Short Term Goal 1 - Progress (Week 1): Met OT Short Term Goal 2 (Week 1): Pt will wash face with min A. OT Short Term Goal 2 - Progress (Week 1): Met OT Short Term Goal 3 (Week 1): Pt will sustain attention on ADL task for >1 min with no more than min VCs. OT Short Term Goal 3 - Progress (Week 1): Met OT Short Term Goal 4 (Week 1): Pt will don shirt with max A. OT Short Term Goal 4 - Progress (Week 1): Met Week 2:  OT Short Term Goal 1 (Week 2): Pt will sequence LB dressing with no more than mod A/cueing OT Short Term Goal 2 (Week 2): Pt will sustain attention to challenging cognitive task for 1 min with no more than min cueing OT Short Term Goal 3 (Week 2): Pt will require no more than min cueing for thoroughness with bathing OT Short Term Goal 4 (Week 2): Pt will complete toilet transfer with min A  Skilled Therapeutic Interventions/Progress Updates:  Patient met lying supine in bed in agreement with OT treatment session. 0/10 pain reported at rest and with activity. OT treatment session with focus on self-care re-education, BUE strengthening, dynamic standing balance, and functional mobility in prep for ADLs. Upon entry, mother and stepfather present at bedside. Patient indicated need to void bladder requiring Min guard for sit to stand from EOB and for mobility to commode in bathroom. 3/3 parts of toileting task and hand hygiene standing at sink level with Min guard for steadying. Patient occasionally reaching out for surfaces requiring cues for safety. Functional mobility to and from dayroom with Min guard for  steadying and cues for pacing. In dayroom, patient participated in Wii game on blue foam mat to further challenge balance. Min guard for steadying throughout task. No reports of double vision. Patient able to verbalize need for seated rest breaks throughout session. Patient/family participated in holiday pictionary with patient demonstrating some difficulty guessing familiar words associated with Christmas. Cornhole toss with bilateral 4lb wrist weights. Patient demonstrating ability to reach outside of BOS to retrieve bean bags before tossing. Patient often undershoots/overshoots bean bag likely 2/2 ptosis on R. Patient reports some lightheadedness with retrieving bean bags from the floor. Session concluded with patient lying supine in bed with call bell within reach, bed alarm activated and all needs met.   Therapy Documentation Precautions:  Precautions Precautions: Fall Precaution Comments: NG tube Restrictions Weight Bearing Restrictions: No RLE Weight Bearing: Weight bearing as tolerated General:    Therapy/Group: Individual Therapy  Jason [name] 07/08/2021, 11:27 AM

## 2021-07-08 NOTE — Progress Notes (Signed)
Patient ID: Jason [name], male   DOB: Jan 05, 2001, 20 y.o.   MRN: [id number]  SW met with pt and pt mother in room to provide updates from team conference, and d/c date is now 1/07 from 1/14. SW informed will continue to provide updates on pt care needs.   Loralee Pacas, MSW, [place] Office: [phone] Cell: [phone] Fax: [phone]

## 2021-07-08 NOTE — Patient Care Conference (Signed)
Inpatient RehabilitationTeam Conference and Plan of Care Update Date: 07/08/2021   Time: 10:00 AM    Patient Name: Jason [name]      Medical Record Number: [id number]  Date of Birth: 2000/07/24 Sex: Male         Room/Bed: 4W16C/4W16C-01 Payor Info: Payor: [employer] / Plan: [employer] COMM PPO / Product Type: *No Product type* /    Admit Date/Time:  06/27/2021  3:59 PM  Primary Diagnosis:  TBI (traumatic brain injury)  Hospital Problems: Principal Problem:   TBI (traumatic brain injury)    Expected Discharge Date: Expected Discharge Date: 07/19/21  Team Members Present: Physician leading conference: Dr. Faith Rogue Social Worker Present: Cecile Sheerer, LCSWA Nurse Present: Kennyth Arnold, RN PT Present: Serina Cowper, PT OT Present: Kearney Hard, OT SLP Present: Gerda Diss, SLP PPS Coordinator present : Edson Snowball, PT     Current Status/Progress Goal Weekly Team Focus  Bowel/Bladder   continent of b/b; LBM: 12/25  regain regular bowel patterns  assist with toileting needs prn   Swallow/Nutrition/ Hydration   Dys 1/NTL, NG tube remains, min A cues for head turn to R  Supervision  tolerating Dys 1/NTL, trials of Dys 2 and thin - hopeful for upgrade this week   ADL's   Great progress so far- consistent with a RLAS VI-VII, awareness improving. min-mod A  transfers, min A bathing/dressing overall. Fatigues quickly still with occasional BP drop  (S) to min A for cognition  cognitive retraining, ADL retraining, transfers, balance training   Mobility   min A overall, gait up to 1100 ft, 4 steps B rails, impulsivity improved, continues to need thigh high TEDs and to have medications prior to sessions for management of vitals with activity  Supervision overall  Activity tolerance, functional mobility, gait and stair training, cognitive retraining, attention, balance, patient'caregiver education   Communication             Safety/Cognition/ Behavioral  Observations  [place] Level VI - mod A  Min A  selective attention, basic problem solving and recall   Pain   no c/o pain  remain pain free  assess pain QS and prn   Skin   skin intact; healed pressure injury to R ear OTA  remain free of new skin breakdown/infection  assess skin QS and prn     Discharge Planning:  D/c to home with support from his parents who will provide 24/7 care.   Team Discussion: Continues to improve. NG tube discontinued today. Dr. Kieth Brightly has spoken with parents and addressed their concerns. Patient has had a couple of syncopal episodes. Awareness is improving. Does better with thigh high ted hose on before moving. Patient is continent B/B. Stage 2 to ear is healing. Tele-sitter and restraints discontinued. Walking contact guard assist with min assist cues. Parents are cleared for transfers in room. Now on Dys2 diet, thin liquids. Min/mod assist with cognition currently. Will schedule family meeting if parents wish to but, continuing daily brain injury education with parents. Patient on target to meet rehab goals: yes, supervision goals with mobility and ADL's, min assist cognition goals.  *See Care Plan and progress notes for long and short-term goals.   Revisions to Treatment Plan:  Adjusting medications, discontinued NG tube, tele-sitter, and restraints.  Teaching Needs: Family education, medication management, skin/wound care, safety awareness, transfer/gait training, etc.  Current Barriers to Discharge: Wound care, Weight bearing restrictions, Medication compliance, and Nutritional means  Possible Resolutions to Barriers: Family education Encourage  fluids and PO intake Schedule family meeting      Medical Summary Current Status: improving arousal and insight and awareness. tolerating diet. ngt out today. bp/hr issues at times  Barriers to Discharge: Medical stability   Possible Resolutions to [employer] Focus: adjusting meds, nutritional mgt,  diet advance, ego support/counseling for family   Continued Need for Acute Rehabilitation Level of Care: The patient requires daily medical management by a physician with specialized training in physical medicine and rehabilitation for the following reasons: Direction of a multidisciplinary physical rehabilitation program to maximize functional independence : Yes Medical management of patient stability for increased activity during participation in an intensive rehabilitation regime.: Yes Analysis of laboratory values and/or radiology reports with any subsequent need for medication adjustment and/or medical intervention. : Yes   I attest that I was present, lead the team conference, and concur with the assessment and plan of the team.   Kennyth Arnold G 07/08/2021, 1:17 PM

## 2021-07-08 NOTE — Progress Notes (Signed)
Physical Therapy TBI Note  Patient Details  Name: Jason [name] MRN: [id number] Date of Birth: [birth year]  Today's Date: 07/08/2021 PT Individual Time: 0905-1000 PT Individual Time Calculation (min): 55 min   Short Term Goals: Week 2:  PT Short Term Goal 1 (Week 2): Patient will improved Berg Balance Scale score by 8 points to meet MCID. PT Short Term Goal 2 (Week 2): Patient will perform basic transfers with CGA consistently. PT Short Term Goal 3 (Week 2): Patient will ambulate >1300 feet on 6 Min Walk Test to meet MCID with CGA. PT Short Term Goal 4 (Week 2): Patient will be able to complete 5 Time Sit to Stand without use of upper extremities for score >0.  Skilled Therapeutic Interventions/Progress Updates:     Patient in bed with his parents at bedside upon PT arrival. Patient alert and agreeable to PT session. Patient denied pain during session.  Therapeutic Activity: Bed Mobility: Patient performed supine to/from sit with supervision. Provided verbal cues for slow controlled movement with positional changes. Transfers: Patient performed sit to/from stand x9 with close supervision. Provided verbal cues for controlled movements for improved stabilization once in standing.  Gait Training:  Patient ambulated >100 feet x4 and 737 feet without AD with CGA and intermittent min A. Ambulated with mild variable foot placement, intermittent narrow BOS, and increased LOB with external distraction. Patient able to teach back goals for increased BOS, increased gait speed, and slow turns for improved balance with gait with min cues prior to gait training. Provided cues for increased arm swing and attention to task, as he had 1 LOB with min A due to external distraction.   Neuromuscular Re-ed: Patient performed the following motor control and visual scanning activities for improved dynamic balance and gait with CGA: -ball toss one-on-one x2 min -ball toss one-on-three (rehab tech, mom, Mark) x3  min -ball return using volleyball mechanics (bump, set, spike) one-on-three x4 min -ambulation eyes closed x20 feet, patient with difficulty maintaining eyes close with increased veering with gait -ambulation backwards x30 feet, good step length and pace -side-stepping x20 feet R/L  -tandem walking on green line x20 feet forwards and 10 feet backwards with 3 steps off line -performed the following visual exercises sitting EOB, improved with patient holding R eye open: visual scanning horizontal and vertical, VOR x1, saccades   Patient in bed with his parents at bedside at end of session with breaks locked, bed alarm set, and all needs within reach.   Therapy Documentation Precautions:  Precautions Precautions: Fall Precaution Comments: NG tube Restrictions Weight Bearing Restrictions: No RLE Weight Bearing: Weight bearing as tolerated Agitated Behavior Scale: TBI Observation Details Observation Environment: CIR Start of observation period - Date: 07/08/21 Start of observation period - Time: 0905 End of observation period - Date: 07/08/21 End of observation period - Time: 1000 Agitated Behavior Scale (DO NOT LEAVE BLANKS) Short attention span, easy distractibility, inability to concentrate: Present to a slight degree Impulsive, impatient, low tolerance for pain or frustration: Absent Uncooperative, resistant to care, demanding: Absent Violent and/or threatening violence toward people or property: Absent Explosive and/or unpredictable anger: Absent Rocking, rubbing, moaning, or other self-stimulating behavior: Absent Pulling at tubes, restraints, etc.: Absent Wandering from treatment areas: Absent Restlessness, pacing, excessive movement: Absent Repetitive behaviors, motor, and/or verbal: Absent Rapid, loud, or excessive talking: Absent Sudden changes of mood: Absent Easily initiated or excessive crying and/or laughter: Absent Self-abusiveness, physical and/or verbal:  Absent Agitated behavior scale total score: 15  Therapy/Group: Individual Therapy  Jason [name] L Jason [name] PT, DPT  07/08/2021, 12:46 PM
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
## 2021-07-09 MED ORDER — BETHANECHOL CHLORIDE 25 MG PO TABS
25.0000 mg | ORAL_TABLET | Freq: Four times a day (QID) | ORAL | Status: DC
  Administered 2021-07-09 – 2021-07-10 (×4): 25 mg via ORAL
  Filled 2021-07-09 (×5): qty 1
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
## 2021-07-09 NOTE — Progress Notes (Signed)
Occupational Therapy Session Note  Patient Details  Name: Jason [name] MRN: [id number] Date of Birth: 2000-08-08  Today's Date: 07/09/2021 OT Individual Time: 1004-1100 OT Individual Time Calculation (min): 56 min    Short Term Goals: Week 2:  OT Short Term Goal 1 (Week 2): Pt will sequence LB dressing with no more than mod A/cueing OT Short Term Goal 2 (Week 2): Pt will sustain attention to challenging cognitive task for 1 min with no more than min cueing OT Short Term Goal 3 (Week 2): Pt will require no more than min cueing for thoroughness with bathing OT Short Term Goal 4 (Week 2): Pt will complete toilet transfer with min A  Skilled Therapeutic Interventions/Progress Updates:  Patient met lying supine in bed in agreement with OT treatment session. 0/10 pain reported at rest and with activity. Patient with request to complete bathing at shower level. Mother already laid out clothing/ADL items in prep for bathing. Toileting/hygiene/clothing management in standing with supervision A. Patient then completed walk-in shower transfer with Min guard to supervision A. UB/LB bathing/dressing with Min guard to supervision A grossly. Patient demonstrates improved balance throughout all tasks. Hospital scavenger hunt with focus on community mobility and higher level cognition. Patient able to locate 3 places in the hospital with use of wall maps and Min question cues. Patient reporting mild fatigue at conclusion of task. Session concluded with patient lying supine in bed with call bell within reach and all needs met. Parents present at bedside.   Therapy Documentation Precautions:  Precautions Precautions: Fall Precaution Comments: NG tube Restrictions Weight Bearing Restrictions: No RLE Weight Bearing: Weight bearing as tolerated General:   Therapy/Group: Individual Therapy  Chaden Doom R [place]-Davis 07/09/2021, 7:03 AM

## 2021-07-09 NOTE — Plan of Care (Signed)
°  Problem: RH Wheelchair Mobility Goal: LTG Patient will propel w/c in controlled environment (PT) Description: LTG: Patient will propel wheelchair in controlled environment, # of feet with assist (PT) Outcome: Not Applicable Flowsheets (Taken 07/09/2021 1730) LTG: Pt will propel w/c in controlled environ  assist needed:: (D/c goal due to patient progressing to level of a functional ambulator.) -- Note: D/c goal due to patient progressing to level of a functional ambulator.   Problem: RH Balance Goal: LTG Patient will maintain dynamic sitting balance (PT) Description: LTG:  Patient will maintain dynamic sitting balance with assistance during mobility activities (PT) Flowsheets (Taken 07/09/2021 1730) LTG: Pt will maintain dynamic sitting balance during mobility activities with:: (Upgraded goal due to patient's progress with postural control and safety awareness.) Independent Note: Upgraded goal due to patient's progress with postural control and safety awareness.  Goal: LTG Patient will maintain dynamic standing balance (PT) Description: LTG:  Patient will maintain dynamic standing balance with assistance during mobility activities (PT) Flowsheets (Taken 07/09/2021 1730) LTG: Pt will maintain dynamic standing balance during mobility activities with:: (Upgraded goal due to patient's progress with postural control and safety awareness.) Independent Note: Upgraded goal due to patient's progress with postural control and safety awareness.    Problem: Sit to Stand Goal: LTG:  Patient will perform sit to stand with assistance level (PT) Description: LTG:  Patient will perform sit to stand with assistance level (PT) Flowsheets (Taken 07/09/2021 1730) LTG: PT will perform sit to stand in preparation for functional mobility with assistance level: (Upgraded goal due to patient's progress with postural control and safety awareness.) Independent Note: Upgraded goal due to patient's progress with  postural control and safety awareness.    Problem: RH Bed Mobility Goal: LTG Patient will perform bed mobility with assist (PT) Description: LTG: Patient will perform bed mobility with assistance, with/without cues (PT). Flowsheets (Taken 07/09/2021 1730) LTG: Pt will perform bed mobility with assistance level of: (Upgraded goal due to patient's progress with postural control and safety awareness.) Independent Note: Upgraded goal due to patient's progress with postural control and safety awareness.    Problem: RH Bed to Chair Transfers Goal: LTG Patient will perform bed/chair transfers w/assist (PT) Description: LTG: Patient will perform bed to chair transfers with assistance (PT). Flowsheets (Taken 07/09/2021 1730) LTG: Pt will perform Bed to Chair Transfers with assistance level: (Upgraded goal due to patient's progress with postural control and safety awareness.) Independent Note: Upgraded goal due to patient's progress with postural control and safety awareness.    Problem: RH Floor Transfers Goal: LTG Patient will perform floor transfers w/assist (PT) Description: LTG: Patient will perform floor transfers with assistance (PT). Flowsheets (Taken 07/09/2021 1733) LTG: PT WILL PERFORM FLOOR TRANFERS  WITH  ASSIST:: Supervision/Verbal cueing
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
## 2021-07-10 LAB — BASIC METABOLIC PANEL WITH GFR
Anion gap: 8 (ref 5–15)
BUN: 19 mg/dL (ref 6–20)
CO2: 23 mmol/L (ref 22–32)
Calcium: 9.3 mg/dL (ref 8.9–10.3)
Chloride: 105 mmol/L (ref 98–111)
Creatinine, Ser: 0.77 mg/dL (ref 0.61–1.24)
GFR, Estimated: >60 mL/min
Glucose, Bld: 109 mg/dL — ABNORMAL HIGH (ref 70–99)
Potassium: 4 mmol/L (ref 3.5–5.1)
Sodium: 136 mmol/L (ref 135–145)

## 2021-07-10 MED ORDER — ADULT MULTIVITAMIN W/MINERALS CH
1.0000 | ORAL_TABLET | Freq: Every day | ORAL | Status: DC
  Administered 2021-07-10 – 2021-07-12 (×3): 1 via ORAL
  Filled 2021-07-10 (×3): qty 1

## 2021-07-10 MED ORDER — BETHANECHOL CHLORIDE 10 MG PO TABS
10.0000 mg | ORAL_TABLET | Freq: Four times a day (QID) | ORAL | Status: DC
  Administered 2021-07-10 – 2021-07-11 (×4): 10 mg via ORAL
  Filled 2021-07-10 (×4): qty 1

## 2021-07-10 NOTE — Progress Notes (Signed)
Patient ID: Jason [name], male   DOB: October 08, 2000, 20 y.o.   MRN: [id number]  SW met with pt and pt parents in room to discuss outpatient therapy recommendations. SW reviewed locations that have TBI within their area. Pt mother reported she will review and see if there are other locations. SW will wait for preference.   Loralee Pacas, MSW, [place] Office: [phone] Cell: [phone] Fax: [phone]

## 2021-07-10 NOTE — Progress Notes (Signed)
Pt requesting medications early as he is tired and would like to get a good night sleep. Also requested that the NT does vitals after 6 am. This nurse collaborated with the NT to do VS after 6.

## 2021-07-10 NOTE — Progress Notes (Addendum)
PROGRESS NOTE   Subjective/Complaints: Had another good night. Famly at bedside. Still peeing a lot. About to get up with PT  ROS: Patient denies fever, rash, sore throat, blurred vision, nausea, vomiting, diarrhea, cough, shortness of breath or chest pain, joint or back pain, headache, or mood change.     Objective:   No results found. No results for input(s): WBC, HGB, HCT, PLT in the last 72 hours.   Recent Labs    07/10/21 0530  NA 136  K 4.0  CL 105  CO2 23  GLUCOSE 109*  BUN 19  CREATININE 0.77  CALCIUM 9.3    Intake/Output Summary (Last 24 hours) at 07/10/2021 4158 Last data filed at 07/10/2021 0800 Gross per 24 hour  Intake 417 ml  Output --  Net 417 ml     Pressure Injury 06/22/21 Ear Right Stage 2 -  Partial thickness loss of dermis presenting as a shallow open injury with a red, pink wound bed without slough. Pressure injury on cartilage of right ear from head laying on right side (Active)  06/22/21 0130  Location: Ear  Location Orientation: Right  Staging: Stage 2 -  Partial thickness loss of dermis presenting as a shallow open injury with a red, pink wound bed without slough.  Wound Description (Comments): Pressure injury on cartilage of right ear from head laying on right side  Present on Admission: No    Physical Exam: Vital Signs Blood pressure 110/68, pulse 86, temperature 97.9 F (36.6 C), temperature source Oral, resp. rate 18, height 5\' 10"  (1.778 m), weight 76.1 kg, SpO2 98 %. Constitutional: No distress . Vital signs reviewed. HEENT: NCAT, EOMI, oral membranes moist Neck: supple Cardiovascular: RRR without murmur. No JVD    Respiratory/Chest: CTA Bilaterally without wheezes or rales. Normal effort    GI/Abdomen: BS +, non-tender, non-distended Ext: no clubbing, cyanosis, or edema Psych: pleasant and cooperative  Skin: right ear wound helaing Neurologic: very alert, oriented to  person, place . Follows commands. Improving speech.  Right complete cranial nerve III palsy: improved lid closure, medial movement and pupil constriction!, motor strength is grossly 4-5/5 in bilateral deltoid, bicep, tricep, grip, hip flexor, knee extensors, ankle dorsiflexor and plantar flexor. Moves all four  Sensory exam appears to be grossly intact Musculoskeletal: Full passive range of motion in all 4 extremities. No joint swelling   Assessment/Plan: 1. Functional deficits which require 3+ hours per day of interdisciplinary therapy in a comprehensive inpatient rehab setting. Physiatrist is providing close team supervision and 24 hour management of active medical problems listed below. Physiatrist and rehab team continue to assess barriers to discharge/monitor patient progress toward functional and medical goals  Care Tool:  Bathing    Body parts bathed by patient: Left arm, Right arm, Chest, Abdomen, Front perineal area, Buttocks, Right upper leg, Left upper leg, Right lower leg, Left lower leg, Face   Body parts bathed by helper: Right arm, Left arm, Chest, Abdomen     Bathing assist Assist Level: Supervision/Verbal cueing     Upper Body Dressing/Undressing Upper body dressing   What is the patient wearing?: Pull over shirt    Upper body assist Assist Level:  Set up assist    Lower Body Dressing/Undressing Lower body dressing      What is the patient wearing?: Pants, Underwear/pull up     Lower body assist Assist for lower body dressing: Contact Guard/Touching assist     Toileting Toileting    Toileting assist Assist for toileting: Contact Guard/Touching assist     Transfers Chair/bed transfer  Transfers assist  Chair/bed transfer activity did not occur: Safety/medical concerns  Chair/bed transfer assist level: Supervision/Verbal cueing     Locomotion Ambulation   Ambulation assist      Assist level: Supervision/Verbal cueing Assistive device: No  Device Max distance: >400 ft   Walk 10 feet activity   Assist     Assist level: Supervision/Verbal cueing Assistive device: No Device   Walk 50 feet activity   Assist Walk 50 feet with 2 turns activity did not occur: Safety/medical concerns  Assist level: Supervision/Verbal cueing Assistive device: No Device    Walk 150 feet activity   Assist Walk 150 feet activity did not occur: Safety/medical concerns  Assist level: Supervision/Verbal cueing Assistive device: No Device    Walk 10 feet on uneven surface  activity   Assist Walk 10 feet on uneven surfaces activity did not occur: Safety/medical concerns   Assist level: Contact Guard/Touching assist     Wheelchair     Assist Is the patient using a wheelchair?: Yes Type of Wheelchair: Manual Wheelchair activity did not occur: Safety/medical concerns  Wheelchair assist level: Supervision/Verbal cueing Max wheelchair distance: >400 ft    Wheelchair 50 feet with 2 turns activity    Assist    Wheelchair 50 feet with 2 turns activity did not occur: Safety/medical concerns   Assist Level: Supervision/Verbal cueing   Wheelchair 150 feet activity     Assist  Wheelchair 150 feet activity did not occur: Safety/medical concerns   Assist Level: Supervision/Verbal cueing   Blood pressure 110/68, pulse 86, temperature 97.9 F (36.6 C), temperature source Oral, resp. rate 18, height 5\' 10"  (1.778 m), weight 76.1 kg, SpO2 98 %.  Medical Problem List and Plan: 1. 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  BCVI of R-ICA with  pseudoaneurysm/Antithrombotics: -DVT/anticoagulation:  Pharmaceutical: Lovenox             -antiplatelet therapy: ASA/Brillinta  3. Pain Management: Gabapentin 300 mg TID w/Tylenol qid.               --oxycodone prn  -changed robaxin to prn  as mom saw it causing sedation 4. Mood/sleep-wake: Neuropsych/LCSW to follow for evaluation when appropriate.              -continue sleep chart             -contin scheduled trazodone 50mg  qhs scheduled  12/24 -dc'ed klonopin             -hold on stimulant for now given improvement             -antipsychotic agents: seroquel. Reduce to 12.5mg  qhs.  -very much  appreciate Dr. Marvetta Gibbons visit with pt/family yesteday. Discussed timing/strategies for determining when to tell Liz Beach about his girlfriend.  5. Neuropsych: This patient is not capable of making decisions on his own behalf. 6. Skin/Wound Care: Routine pressure relief measures.  7. Fluids/Electrolytes/Nutrition:  12/29 intake fair, pushing po  -bmet reviewed and wnl 8. Tachycardia: Monitor HR TID--continue Metoprolol bid. See below              Vitals:   07/09/21 2014 07/10/21 0533  BP: 114/67 110/68  Pulse: 82 86  Resp: 16 18  Temp: 98.2 F (36.8 C) 97.9 F (36.6 C)  SpO2: 99% 98%    9. Acute blood loss anemia: improved Hgb stable >12 10. Urinary retention:              --Started on Silodosin on 12/16.--dc d/t syncopal episode   12/29 voiding well  -decrease urecholine to 10 mg qid 11. Hypernatremia w/AKI:               -resolved 12. Leukocytosis: gradually improving   14. Facial fractures: Follow up with Dr. Annalee Genta on outpatient basis. 15. Possible low grade aortic injury: Follow up with Dr. Lenell Antu in the future 16. 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            -showing nice improvement!  17,  Dysphagia- advanced to D2/Nectar diet per SLP  -see #7 18. Syncopal episode (x 2) likely d/t prerenal azotemia.   -received IVF earlier in  week  -dc'ed norvasc  -TEDS/ABD binder when up  -12/25 reduced metoprolol to 25mg  bid  - bp/hr holding 19. Loose stool: generally improved  -lomotil prn  -added fiber to regimen 20. Left ICA pseudoaneurysm s/p stent  -INR was planning a f/u CTA for yesterday or today. Not scheduled  -will f/u with INR regarding test today   LOS: 13 days A FACE TO FACE EVALUATION WAS PERFORMED  07/10/2021, 9:24 AM

## 2021-07-10 NOTE — Plan of Care (Signed)
°  Problem: RH Swallowing Goal: LTG Patient will consume least restrictive diet using compensatory strategies with assistance (SLP) Description: LTG:  Patient will consume least restrictive diet using compensatory strategies with assistance (SLP) Flowsheets (Taken 07/10/2021 1603) LTG: Pt Patient will consume least restrictive diet using compensatory strategies with assistance of (SLP): Modified Independent Note: Goal upgraded due to consistent functional progress   Problem: RH Attention Goal: LTG Patient will demonstrate this level of attention during functional activites (SLP) Description: LTG:  Patient will will demonstrate this level of attention during functional activites (SLP) Flowsheets (Taken 07/10/2021 1603) Patient will demonstrate during cognitive/linguistic activities the attention type of: Selective Patient will demonstrate this level of attention during cognitive/linguistic activities in: Controlled LTG: Patient will demonstrate this level of attention during cognitive/linguistic activities with assistance of (SLP): Modified Independent Note: Goal upgraded due to consistent functional progress   Problem: RH Cognition - SLP Goal: RH LTG Patient will demonstrate orientation with cues Description:  LTG:  Patient will demonstrate orientation to person/place/time/situation with cues (SLP)   Flowsheets (Taken 07/10/2021 1603) LTG Patient will demonstrate orientation to:  Person  Place  Time  Situation LTG: Patient will demonstrate orientation using cueing (SLP): Independent Note: Goal upgraded due to consistent functional progress   Problem: RH Problem Solving Goal: LTG Patient will demonstrate problem solving for (SLP) Description: LTG:  Patient will demonstrate problem solving for basic/complex daily situations with cues  (SLP) Flowsheets (Taken 07/10/2021 1603) LTG Patient will demonstrate problem solving for: Modified Independent Note: Goal upgraded due to consistent  functional progress   Problem: RH Memory Goal: LTG Patient will use memory compensatory aids to (SLP) Description: LTG:  Patient will use memory compensatory aids to recall biographical/new, daily complex information with cues (SLP) Flowsheets (Taken 07/10/2021 1603) LTG: Patient will use memory compensatory aids to (SLP): Modified Independent Note: Goal upgraded due to consistent functional progress   Problem: RH Awareness Goal: LTG: Patient will demonstrate awareness during functional activites type of (SLP) Description: LTG: Patient will demonstrate awareness during functional activites type of (SLP) Flowsheets (Taken 07/10/2021 1603) LTG: Patient will demonstrate awareness during cognitive/linguistic activities with assistance of (SLP): Modified Independent Note: Goal upgraded due to consistent functional progress

## 2021-07-10 NOTE — Progress Notes (Addendum)
Contacted Aimee Han, PA-C regarding order for follow-up CTA of head and neck. Dr. Corliss Skains wants study to be done on Monday, 1/2. She will place the order.

## 2021-07-10 NOTE — Progress Notes (Addendum)
Nutrition Follow-up  DOCUMENTATION CODES:  Not applicable  INTERVENTION:  Continue diet per SLP.  Discontinue Nepro shakes TID.  Add Mighty Shake II TID with meals, each supplement provides 480-500 kcals and 20-23 grams of protein.  Add Magic cup TID with meals, each supplement provides 290 kcal and 9 grams of protein.  Add MVI with minerals daily.  Continue to encourage PO and supplement intake.  NUTRITION DIAGNOSIS:  Inadequate oral intake related to dysphagia as evidenced by NPO status. - progressing, ongoing  GOAL:  Patient will meet greater than or equal to 90% of their needs. - progressing with PO  MONITOR:  PO intake, Supplement acceptance, Diet advancement, Labs, Weight trends, Skin, I & O's  REASON FOR ASSESSMENT:  Consult Enteral/tube feeding initiation and management  ASSESSMENT:  20 yo male admitted to [facility] on 12/16 with functional deficits secondary to TBI sustained on 06/08/21 with basilar and bilateral temporal bone fractures; also sustained right MCA territory infarct. 11/28 - Cortrak placed; tip gastric per xray  12/1 - s/p carotid arteriogram and pipeline stent placement of R ICA  12/7 - s/p carotid arteriogram and placement of pipeline stents for pseudoaneurysm 12/12 - extubated 12/15 - failed MBS; NPO 12/16 - admitted to CIR 12/22 - repeat MBS (D1/nectar) 12/27 - Cortrak removed, TF stopped; advanced to D2/nectar 12/29 - advanced to D3/nectar  RD working remotely.  Messaged LPN. She reports pt is doing well with his new diet and has transitioned to Dysphagia 3 with nectar thickened liquids.  She reports he does not like the Nepro shakes. RD suggested trying Mighty shakes from the kitchen, which are also pre-thickened to nectar consistency. They may taste a bit better for patient.  RD also to re-add Magic Cup TID to meals given that pt has advanced to D3.  Admit wt: 77.1 kg Current wt: 76.1 kg  Supplements: Nepro shakes TID  (refused all)  Medications: reviewed; Pepcid BID, miralax PRN (given once today)  Labs: reviewed; Glucose 109 (H)  NUTRITION - FOCUSED PHYSICAL EXAM: Unable to perform - defer to in-person follow-up  Diet Order:   Diet Order             DIET DYS 3 Room service appropriate? Yes; Fluid consistency: Nectar Thick  Diet effective now                  EDUCATION NEEDS:  Not appropriate for education at this time  Skin:  Skin Assessment: Skin Integrity Issues: Skin Integrity Issues:: Stage II, Other (Comment) Stage II: right ear Other: skin tear on penis  Last BM:  07/06/21 - Type 5, small  Height:  Ht Readings from Last 1 Encounters:  06/27/21 5\' 10"  (1.778 m)   Weight:  Wt Readings from Last 1 Encounters:  07/10/21 76.1 kg   BMI:  Body mass index is 24.07 kg/m.  Estimated Nutritional Needs:  Kcal:  2500-2800 kcals Protein:  140-165 g Fluid:  >/= 2L  07/12/21, RD, LDN (she/her/hers) Clinical Inpatient Dietitian RD Pager/After-Hours/Weekend Pager # [phone] [place]

## 2021-07-10 NOTE — Progress Notes (Signed)
Occupational Therapy Session Note  Patient Details  Name: Jason [name] MRN: [id number] Date of Birth: 2001/04/21  Today's Date: 07/10/2021 OT Individual Time: 1445-1526 OT Individual Time Calculation (min): 41 min    Short Term Goals: Week 1:  OT Short Term Goal 1 (Week 1): Pt will maintain static sitting balance EOB with max A of 1 for >5 min in prep for seated ADL. OT Short Term Goal 1 - Progress (Week 1): Met OT Short Term Goal 2 (Week 1): Pt will wash face with min A. OT Short Term Goal 2 - Progress (Week 1): Met OT Short Term Goal 3 (Week 1): Pt will sustain attention on ADL task for >1 min with no more than min VCs. OT Short Term Goal 3 - Progress (Week 1): Met OT Short Term Goal 4 (Week 1): Pt will don shirt with max A. OT Short Term Goal 4 - Progress (Week 1): Met Week 2:  OT Short Term Goal 1 (Week 2): Pt will sequence LB dressing with no more than mod A/cueing OT Short Term Goal 2 (Week 2): Pt will sustain attention to challenging cognitive task for 1 min with no more than min cueing OT Short Term Goal 3 (Week 2): Pt will require no more than min cueing for thoroughness with bathing OT Short Term Goal 4 (Week 2): Pt will complete toilet transfer with min A   Skilled Therapeutic Interventions/Progress Updates:    Pt greeted at time of session resting bed level with mother present in room, as she had already picked out clothes for the pt. Pt requesting to shower, and peformed bed mob and ambulated to bathroom Supervision. CGA for walk in shower transfer as pt had difficulty seeing shower seat on R side and did hit it as he walked in, but able to stabilize on grab bar. Pt performed UB/LB bathing seated and short standing sessions to wash periarea and buttocks. Pt requesting curtain be pulled mostly for privacy and gave the pt privacy as much as possible during session. After drying off, pt walking back out to room same manner as above no AD, seated at EOB for dressing tasks. Pt  needing set up for shirt and Supervision when standing for underwear and pants. Donned socks and shoes with set up before walking to sink level for oral hygiene, grooming, etc. Walked short distance room <> day room and with 3# wrist weights, performed bean bag toss in standing grading activity to place box further away and to the right side. Back in room set up bed level with mother present, call bell in reach.   Therapy Documentation Precautions:  Precautions Precautions: Fall Precaution Comments: NG tube Restrictions Weight Bearing Restrictions: No RLE Weight Bearing: Weight bearing as tolerated    Therapy/Group: Individual Therapy  Viona Gilmore 07/10/2021, 12:58 PM

## 2021-07-10 NOTE — Progress Notes (Signed)
Speech Language Pathology Weekly Progress and Session Note  Patient Details  Name: Jason [name] MRN: [id number] Date of Birth: 10-Apr-2001  Beginning of progress report period: July 04, 2021 End of progress report period: July 10, 2021  Short Term Goals: Week 2: SLP Short Term Goal 1 (Week 2): Patient will consume current diet with minimal overt s/s of aspiration with Mod verbal cues for use of swallowing compensatory strategies. SLP Short Term Goal 1 - Progress (Week 2): Met SLP Short Term Goal 2 (Week 2): Patient will consume trials of thin liquids via straw while utilizing a head turn to the right with Mod verbal cues with minimal overt s/s of aspiration over 3 sessions to assess readiness for repeat MBS and/or upgrade. SLP Short Term Goal 2 - Progress (Week 2): Met SLP Short Term Goal 3 (Week 2): Patient will demonstate selective attention to functional tasks for 30 minutes in a mildly distracting enviornment with Min verbal cues for redirection. SLP Short Term Goal 3 - Progress (Week 2): Met SLP Short Term Goal 4 (Week 2): Patient will utilize external aids for orientation to place, time and situation with Min verbal and visual cues. SLP Short Term Goal 4 - Progress (Week 2): Met SLP Short Term Goal 5 (Week 2): Patient will demonstrate functional problem solving for basic and familiar tasks with mod verbal cues. SLP Short Term Goal 5 - Progress (Week 2): Met SLP Short Term Goal 6 (Week 2): Patient will self-monitor and correct errors during functional tasks with mod verbal cues. SLP Short Term Goal 6 - Progress (Week 2): Met    New Short Term Goals: Week 3: SLP Short Term Goal 1 (Week 3): STG=LTG due to ELOS  Weekly Progress Updates: Patient continues to make excellent gains and has met 6 of 6 STGs this reporting period. Currently, patient demonstrates behaviors consistent with [place] and requires overall supervision-to-min A cues to complete functional and familiar  tasks safely in regards to alternating attention, complex problem solving, emergent awareness, and recall with use of external compensatory aids. Patient does not appear to be exhibiting any expressive/receptive language difficulty at this time. Speech intelligibility continues to be mildly impacted by cranial nerve Vll involvement resulting in right facial weakness but patient compensates well and is perceived as 100% intelligible at the sentence level. Patient demonstrating excellent progress with swallow function from bedside and diet has been advanced to dysphagia 3 textures, nectar thick liquids via straw with use of chin tuck and sup-to-min A cues to implement swallowing compensatory strategies and postural modifications. Patient is scheduled for repeat MBS tomorrow (12/30) to further assess and determine candidacy for possible diet/liquid advancement. Patient and family education is ongoing. Patient would benefit from continued skilled SLP intervention to maximize cognitive and swallowing function prior to discharge. Pt is expected to continue to improve with ongoing skilled SLP intervention.    Intensity: Minumum of 1-2 x/day, 30 to 90 minutes Frequency: 3 to 5 out of 7 days Duration/Length of Stay: 07/19/21 Treatment/Interventions: English as a second language teacher;Dysphagia/aspiration precaution training;Functional tasks;Patient/family education;Therapeutic Activities;Therapeutic Exercise;Internal/external aids;Environmental controls;Cognitive remediation/compensation   Patty Sermons 07/10/2021, 5:01 PM

## 2021-07-10 NOTE — Progress Notes (Addendum)
Speech Language Pathology TBI Note  Patient Details  Name: Jason [name] MRN: [id number] Date of Birth: 01-06-2001  Today's Date: 07/10/2021 SLP Individual Time: 1300-1345 SLP Individual Time Calculation (min): 45 min  Short Term Goals: Week 2: SLP Short Term Goal 1 (Week 2): Patient will consume current diet with minimal overt s/s of aspiration with Mod verbal cues for use of swallowing compensatory strategies. SLP Short Term Goal 2 (Week 2): Patient will consume trials of thin liquids via straw while utilizing a head turn to the right with Mod verbal cues with minimal overt s/s of aspiration over 3 sessions to assess readiness for repeat MBS and/or upgrade. SLP Short Term Goal 3 (Week 2): Patient will demonstate selective attention to functional tasks for 30 minutes in a mildly distracting enviornment with Min verbal cues for redirection. SLP Short Term Goal 4 (Week 2): Patient will utilize external aids for orientation to place, time and situation with Min verbal and visual cues. SLP Short Term Goal 5 (Week 2): Patient will demonstrate functional problem solving for basic and familiar tasks with mod verbal cues. SLP Short Term Goal 6 (Week 2): Patient will self-monitor and correct errors during functional tasks with mod verbal cues.  Skilled Therapeutic Interventions: Skilled ST treatment focused on swallowing and cognitive goals. Patient's lunch arrived during treatment. Patient consumed with timely mastication with majority of bolus on left side, complete oral clearance, and without overt s/s of aspiration. Pt agreeable to trial dys 3 textures. Pt tolerated well with effective mastication on left side, good control, complete oral clearance, and without s/s of aspiration. Recommend diet advancement to dysphagia 3 textures, nectar thick liquids. Plan to proceed with scheduled MBS tomorrow. SLP addressed questions related to MBS. Pt and mom both verbalized understanding and agreement with plan.  SLP facilitated complex deductive reasoning tasks with sup A cues for organization and error awareness. Patient completed tasks in a timely manner with good carry over of organization strategies throughout. Patient was left in bed with alarm activated and immediate needs within reach at end of session. Continue per current plan of care.      Pain Pain Assessment Pain Scale: 0-10 Pain Score: 0-No pain  Agitated Behavior Scale: TBI Observation Details Observation Environment: CIR Start of observation period - Date: 07/10/21 Start of observation period - Time: 1300  End of observation period - Date: 07/10/21 End of observation period - Time: 1345 Agitated Behavior Scale (DO NOT LEAVE BLANKS) Short attention span, easy distractibility, inability to concentrate: Absent Impulsive, impatient, low tolerance for pain or frustration: Absent Uncooperative, resistant to care, demanding: Absent Violent and/or threatening violence toward people or property: Absent Explosive and/or unpredictable anger: Absent Rocking, rubbing, moaning, or other self-stimulating behavior: Absent Pulling at tubes, restraints, etc.: Absent Wandering from treatment areas: Absent Restlessness, pacing, excessive movement: Absent Repetitive behaviors, motor, and/or verbal: Absent Rapid, loud, or excessive talking: Absent Sudden changes of mood: Absent Easily initiated or excessive crying and/or laughter: Absent Self-abusiveness, physical and/or verbal: Absent Agitated behavior scale total score: 14  Therapy/Group: Individual Therapy  Tamala Ser 07/10/2021, 1:44 PM

## 2021-07-10 NOTE — Plan of Care (Signed)
Cognitive goals upgraded d/t pt progress  Problem: RH Awareness Goal: LTG: Patient will demonstrate awareness during functional activites type of (OT) Description: LTG: Patient will demonstrate awareness during functional activites type of (OT) Flowsheets (Taken 07/10/2021 0614) Patient will demonstrate awareness during functional activites type of: Anticipatory LTG: Patient will demonstrate awareness during functional activites type of (OT): (upgraded 12/29 -SD) Minimal Assistance - Patient > 75%   Problem: RH Attention Goal: LTG Patient will demonstrate this level of attention during functional activites (OT) Description: LTG:  Patient will demonstrate this level of attention during functional activites  (OT) Flowsheets (Taken 07/10/2021 [phone]) Patient will demonstrate this level of attention during functional activites: Selective Patient will demonstrate above attention level in the following environment: Home LTG: Patient will demonstrate this level of attention during functional activites (OT): (upgraded 12/29 -SD) Supervision

## 2021-07-10 NOTE — Progress Notes (Signed)
Physical Therapy TBI Note  Patient Details  Name: Jason [name] MRN: [id number] Date of Birth: August 28, 2000  Today's Date: 07/10/2021 PT Individual Time: 0800-0900 and 1400-1445 PT Individual Time Calculation (min): 30 min and 45 min   Short Term Goals: Week 2:  PT Short Term Goal 1 (Week 2): Patient will improved Berg Balance Scale score by 8 points to meet MCID. PT Short Term Goal 2 (Week 2): Patient will perform basic transfers with CGA consistently. PT Short Term Goal 3 (Week 2): Patient will ambulate >1300 feet on 6 Min Walk Test to meet MCID with CGA. PT Short Term Goal 4 (Week 2): Patient will be able to complete 5 Time Sit to Stand without use of upper extremities for score >0.  Skilled Therapeutic Interventions/Progress Updates:     Session 1: Patient sitting EOB finishing breakfast with his parents at bedside upon PT arrival. Patient alert and agreeable to PT session. Patient denied pain during session. MD rounded at beginning of session. Notable increase in visual motor ROM of R eye and mild activation of R eye lid raise and constriction noted during MD assessment. Reinforced MD education on addition of use of TV and personal electronics such as the patient's phone into patient's daily routine. Recommending low dose, 30-60 min 1-2 times per day while monitoring for symptoms such as headache, eye straining, fatigue, or behaviors such as increased frustration tolerance or agitation due to overstimulation.   Patient stated his goal for this session was to try to jump, family stated goal was to focus on descending stairs. Focused session on gait training and lower extremity NMR focused on dynamic and ballistic activities and eccentric quad control. Patient perseverative on lower extremity fatigue from "walking so much yesterday." Able to redirect and engage patient in activities despite perseveration. Demonstrated decreased incite into deficits during session, often surprised at how  difficult tasks were for him, then stating "I am not a fall risk," indicating toward his wrist band and "I will be running when I get home." Provided gentle cues for awareness of challenge of higher level balance and dynamic activities performed and utilized activities during session to inform patient of his balance deficits at this time.   Therapeutic Activity: Transfers: Patient performed sit to/from stand with supervision throughout session. Provided verbal cues for controlled descent x2 and use of ankle strategies to correct mild anterior LOB in standing x1. Patient performed an ambulatory transfer to/from the bathroom with CGA provided by his father, Loraine Leriche. Patient was continent of bladder during toileting. Performed hand hygiene at the sink after independently with CGA for balance. Patient and Loraine Leriche demonstrated safe mobility and guarding throughout. Cleared patient's parents to ambulate patient to/from the bathroom in the room. Safety plan updated and RN and OT in agreement.   Gait Training:  Patient ambulated >200 feet and >100 feet x2 without an AD with close supervision for safety. Ambulated with mild variable foot placement, intermittent narrow BOS, and increased gait deviations and reduced gait speed with external distraction. Provided cues for increased arm swing and attention to task for improved balance.   Neuromuscular Re-ed: Patient performed the following lower extremity motor control and balance activities with CGA-close supervision: -squats x5 focused on eccentric quad and gluteal activation -squats progressing to heel raise at the top 2x5 focused on coordination and balance control of added gastroc activation -squat jumps x3 focused on small jump, power from bottom of squat and controlled landing, patient reported increased lower extremity fatigue with this activity -step  downs with backwards return to 4" step R/L x4, increased challenge to follow cues initially, focused on eccentric  quad activation -single leg squats on 3" step R/L x8 touching contralateral heel to the floor without weight shift, requried max cues for technique and min A for balance without upper extremity support -standing behind a head height curtain "setting" a volleyball back and forth with Loraine Leriche to simulate setting over the net x3 min, patient demonstrated good reflexes and visual scanning during task, dropped the ball x1 and able to walk and squat down to retrieve it -SLS L 2x3sec, R 2x2-3 sec 2x6-8 sec, patient surprised by challenge, however, rationalized "it's because I walked a lot yesterday."  Patient sitting EOB with his mother at bedside at end of session with breaks locked and all needs within reach.   Patient's father, Loraine Leriche, stepped out with PT with appropriate questions about patient's behaviors and expectations for d/c. PT provided education on behaviors appropriate for current stage of recovery and consistent with Rancho VI-VII level of recovery. Educated on strategies to redirect discussions or activities to reduce frustration tolerance or perseveration and promote appropriate behaviors. Discussed scheduling a family meeting with the Rehab team to including all therapies, MD, and CSW. Loraine Leriche stated this would be beneficial, CSW made aware to coordinate scheduling.   Session 2: Patient in bed with his mother at bedside upon PT arrival. Patient alert and agreeable to PT session. Patient denied pain during session. Patient requested to go outside this session due to nice weather.   Focused session on community integration with path finding, dual task training in a busy environment outside, and patient education and management of expectations for recovery.   Therapeutic Activity: Bed Mobility: Patient performed supine to/from sit independently. Transfers: Patient performed sit to/from stand from the bed and 2 different bench seats with supervision. Provided verbal cues for safety awareness in outdoor  setting.  Community Integration:  Patient ambulated from his room to the [facility] entrance, past the parking deck to the park across the street, ascending 10 steps with 1 rail, crossing 2 cross walks, and ambulating over level tile, on an elevator, over unlevel sidewalk, and over unlevel grassy terrain with leaves, sticks, a ditch, and several trees. On return to his room patient able to recall path back to his room without cues with intermittent increased time to visually scan the environment for the correct direction. Patient required cues x1 and used teach back method for looking both ways at crosswalks and appropriately self-selected use of rail on stairs for safety. Ambulated without an AD with supervision on level terrain and CGA on unlevel terrain.   Neuromuscular Re-ed: Patient performed the following dual task training activities: -walking on unlevel grassy terrain in a busy outdoor environment counting forward by 3s from 0-50 with CGA and 100% accuracy in 2 ~50 foot laps -walking on unlevel grassy terrain in a busy outdoor environment counting backwards by 3s from 67 to -2 with CGA and 2 errors that patient was able to self-correct and increased time and cues for continuing cognitive task in 4 ~50 foot laps  Provided patient with an outdoor seated rest break. Patient very engaged with outdoor and arithmetic tasks. Patient reports he walks "a lot" around [place] and loves math. He recalls that he was taking accounting classes, but was unsure of what the name of any of his classes were. Educated on general TBI recovery and need for increased time for rehab and integration home then back into the  community, followed by vocational rehab to return to school. Managed expectations for returning to school and emphasized the importance of completing each phase of recovery and not rushing back for increased success. Patient stated understanding and attended and asked appropriate questions during discussion.    Patient sitting EOB with his mother in the room at end of session with breaks locked, bed alarm set, and all needs within reach.   Therapy Documentation Precautions:  Precautions Precautions: Fall Precaution Comments: NG tube Restrictions Weight Bearing Restrictions: No RLE Weight Bearing: Weight bearing as tolerated Agitated Behavior Scale: TBI Observation Details Observation Environment: [facility] Start of observation period - Date: 07/10/21 Start of observation period - Time: 1300 End of observation period - Date: 07/10/21 End of observation period - Time: 1345 Agitated Behavior Scale (DO NOT LEAVE BLANKS) Short attention span, easy distractibility, inability to concentrate: Absent Impulsive, impatient, low tolerance for pain or frustration: Absent Uncooperative, resistant to care, demanding: Absent Violent and/or threatening violence toward people or property: Absent Explosive and/or unpredictable anger: Absent Rocking, rubbing, moaning, or other self-stimulating behavior: Absent Pulling at tubes, restraints, etc.: Absent Wandering from treatment areas: Absent Restlessness, pacing, excessive movement: Absent Repetitive behaviors, motor, and/or verbal: Absent Rapid, loud, or excessive talking: Absent Sudden changes of mood: Absent Easily initiated or excessive crying and/or laughter: Absent Self-abusiveness, physical and/or verbal: Absent Agitated behavior scale total score: 14    Therapy/Group: Individual Therapy  Charlcie Prisco L Herchel Hopkin PT, DPT  07/10/2021, 5:33 PM

## 2021-07-11 ENCOUNTER — Inpatient Hospital Stay (HOSPITAL_COMMUNITY): Payer: BC Managed Care – PPO

## 2021-07-11 MED ORDER — GABAPENTIN 300 MG PO CAPS
300.0000 mg | ORAL_CAPSULE | Freq: Two times a day (BID) | ORAL | Status: DC
  Administered 2021-07-11 – 2021-07-14 (×6): 300 mg via ORAL
  Filled 2021-07-11 (×6): qty 1

## 2021-07-11 MED ORDER — QUETIAPINE FUMARATE 25 MG PO TABS
12.5000 mg | ORAL_TABLET | Freq: Every day | ORAL | Status: DC
  Administered 2021-07-11 – 2021-07-13 (×3): 12.5 mg via ORAL
  Filled 2021-07-11 (×3): qty 1

## 2021-07-11 NOTE — Progress Notes (Signed)
Occupational Therapy TBI Note  Patient Details  Name: Jason [name] MRN: [id number] Date of Birth: Oct 30, 2000  Session 1: Today's Date: 07/11/2021 OT Individual Time: 8185-6314 OT Individual Time Calculation (min): 60 min   Session 2: Today's Date: 07/11/2021 OT Individual Time: 1400-1500 OT Individual Time Calculation (min): 60 min    Short Term Goals: Week 2:  OT Short Term Goal 1 (Week 2): Pt will sequence LB dressing with no more than mod A/cueing OT Short Term Goal 2 (Week 2): Pt will sustain attention to challenging cognitive task for 1 min with no more than min cueing OT Short Term Goal 3 (Week 2): Pt will require no more than min cueing for thoroughness with bathing OT Short Term Goal 4 (Week 2): Pt will complete toilet transfer with min A  Skilled Therapeutic Interventions/Progress Updates:    Session 1: Pt received supine and had not yet received breakfast tray. He came to EOB and consumed D3 diet with nectar thick liquids with supervision. Min cueing required for pacing and for chin tuck. He completed oral care at the sink in standing with (S). CGA- (S) for all functional mobility during session, still with mild posterior bias in standing and difficulty maintaining a straight trajectory. Initial focus of session on cognitive remediation with intervention focused on selective/sustained attention, working and short term memory, and problem solving. Pt required mod cueing for working memory when completing a map- question book task requiring him to remember two questions at a time when flipping between question sheet and map. He had difficulty with error recognition and required cueing for pacing and sequencing. He required min cueing overall for selective attention, overall improved ability to self correct becoming distracted. He then completed functional activity focused on core and UE strengthening- sitting on a large theraball holding 5 lb dumbbells for a guided circuit focused  on deltoids, bicep, and triceps. He required min cueing for awareness of LOB and for positioning. Pt openly discussing loss of his girlfriend and anger associated with it. Emotional support  provided and encouragement to continue talking with family/friends. Pt returned to his room and was left sitting up with all needs met, family present. ABS 14    Session 2: Pt received supine with no c/o pain. He requested to take a shower. Pt completed ambulatory transfer to the bathroom with (S). He required min cueing for fall risk reduction strategies. Pt completed bathing in standing with close (S), not reaching distally. He returned to EOB and donned shirt and pants with close (S). He took a large sip of cheerwine and had an episode of coughing, stating he "drank too fast", once he finished coughing he was able to further consume thin liquids without any episodes of coughing. Pt requested to go outside. He completed 300+ ft of functional mobility with CGA fading to supervision. Pt was able to selectively attend to conversation with OT and ambulation with (S). While outside he completed 3 trials of 300 ft with CGA, fading to (S). Provided dual tasking with simple-moderate math that he required extra time to complete with 75% accuracy. Provided edu on TBI cognitive recovery as he became frustrated. Also sat and discussed loss of his girlfriend, OT providing emotional support and therapeutic listening. Discussed home routine and accepting support from his friends and family. He returned to his room and was left sitting EOB with his family present.    Therapy Documentation Precautions:  Precautions Precautions: Fall Precaution Comments: NG tube Restrictions Weight Bearing Restrictions: Yes RLE  Weight Bearing: Weight bearing as tolerated   Agitated Behavior Scale: TBI Observation Details Observation Environment: CIR Start of observation period - Date: 07/11/21 Start of observation period - Time: 1400 End  of observation period - Date: 07/11/21 End of observation period - Time: 1500 Agitated Behavior Scale (DO NOT LEAVE BLANKS) Short attention span, easy distractibility, inability to concentrate: Absent Impulsive, impatient, low tolerance for pain or frustration: Absent Uncooperative, resistant to care, demanding: Absent Violent and/or threatening violence toward people or property: Absent Explosive and/or unpredictable anger: Absent Rocking, rubbing, moaning, or other self-stimulating behavior: Absent Pulling at tubes, restraints, etc.: Absent Wandering from treatment areas: Absent Restlessness, pacing, excessive movement: Absent Repetitive behaviors, motor, and/or verbal: Absent Rapid, loud, or excessive talking: Absent Sudden changes of mood: Absent Easily initiated or excessive crying and/or laughter: Absent Self-abusiveness, physical and/or verbal: Absent Agitated behavior scale total score: 14   Therapy/Group: Individual Therapy  Curtis Sites 07/11/2021, 3:11 PM

## 2021-07-11 NOTE — Progress Notes (Signed)
Modified Barium Swallow Progress Note  Patient Details  Name: Jason [name] MRN: [id number] Date of Birth: 20-Oct-2000  Today's Date: 07/11/2021  Modified Barium Swallow completed.  Full report located under Chart Review in the Imaging Section.  Brief recommendations include the following:  Clinical Impression  Patient demonstrates significant improvement in swallow function as compared to his most recent MBS on 12/22. Of note, Coretrak feeding tube is not in place during today's test as it has been the previous two. During oral phase he exhibited decreased oral containment of liquids leading to premature spillage with nectar thick and thin liquids. Mastication and anterior to posterior transit of mechanical soft solids textures was mildly delayed but no significant oral residuals remained s/p initial swallow. During pharyngeal phase, patient exhibited swallow initiation delay to vallecular sinus with puree solids, mechanical soft solids and swallow initiation delay at level of pyriform sinuses with nectar thick and thin liquids. Trace vallecular sinus residuals and residuals on top of UES remained after initial swallow with all consistencies except puree solids and residuals cleared with subsequent swallow. With cup sips of thin liquid barium, patient exhibited instances of trace amount of silent penetration above vocal cords (PAS 2) which cleared from laryngeal vestibule and one instance of trace sensed penetration to the vocal cords (PAS 5)with thin liquids and patient verbalizing "that was a rough one". When attempting barium tablet taken with thin liquid barium, patient unable to transit tablet orally, leading to premature spillage of thin liquids into laryngeal vestibule followed by sensed aspiration of thin liquid barium(PAS 7). He then had to spit barium tablet out. Following aspiration event, he recovered and was able to consume thin liquids via cup sips and straw sips without aspiration. He  tolerated mixed consistencies of mechanical soft solids and thin liquids without aspiration. At this time, SLP is recommending to upgrade patient's liquids from nectar thick to thin, advance medicaiton tablets to whole in pudding/puree but continue with Dys 3 solids. SLP educated patient and his parents on results and recommendations following MBS.   Swallow Evaluation Recommendations       SLP Diet Recommendations: Dysphagia 3 (Mech soft) solids;Thin liquid   Liquid Administration via: Straw;Cup   Medication Administration: Whole meds with puree   Supervision: Patient able to self feed;Full supervision/cueing for compensatory strategies   Compensations: Minimize environmental distractions;Slow rate;Small sips/bites   Postural Changes: Seated upright at 90 degrees   Oral Care Recommendations: Oral care BID       Angela Nevin, MA, CCC-SLP Speech Therapy

## 2021-07-11 NOTE — Progress Notes (Addendum)
PROGRESS NOTE   Subjective/Complaints: Slept well last night. Talked to him about his GF's passing as he's been aware of it for a few days now. Struggling with a lot of emotions about why he was spared, some guilt, sadness, etc. Parents have been very supportive.   ROS: Patient denies fever, rash, sore throat, blurred vision, nausea, vomiting, diarrhea, cough, shortness of breath or chest pain, joint or back pain, headache, or mood change.     Objective:   No results found. No results for input(s): WBC, HGB, HCT, PLT in the last 72 hours.   Recent Labs    07/10/21 0530  NA 136  K 4.0  CL 105  CO2 23  GLUCOSE 109*  BUN 19  CREATININE 0.77  CALCIUM 9.3    Intake/Output Summary (Last 24 hours) at 07/11/2021 0957 Last data filed at 07/11/2021 0044 Gross per 24 hour  Intake 355 ml  Output 0 ml  Net 355 ml     Pressure Injury 06/22/21 Ear Right Stage 2 -  Partial thickness loss of dermis presenting as a shallow open injury with a red, pink wound bed without slough. Pressure injury on cartilage of right ear from head laying on right side (Active)  06/22/21 0130  Location: Ear  Location Orientation: Right  Staging: Stage 2 -  Partial thickness loss of dermis presenting as a shallow open injury with a red, pink wound bed without slough.  Wound Description (Comments): Pressure injury on cartilage of right ear from head laying on right side  Present on Admission: No    Physical Exam: Vital Signs Blood pressure (!) 110/58, pulse 76, temperature 98.4 F (36.9 C), temperature source Oral, resp. rate 18, height 5\' 10"  (1.778 m), weight 75 kg, SpO2 99 %. Constitutional: No distress . Vital signs reviewed. HEENT: NCAT, EOMI, oral membranes moist Neck: supple Cardiovascular: RRR without murmur. No JVD    Respiratory/Chest: CTA Bilaterally without wheezes or rales. Normal effort    GI/Abdomen: BS +, non-tender,  non-distended Ext: no clubbing, cyanosis, or edema Psych: pleasant and cooperative  Skin: right ear wound helaing Neurologic: very alert, oriented to person, place . Follows commands. Improving speech.  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 Good standing balance  Sensory exam appears to be grossly intact Musculoskeletal: Full passive range of motion in all 4 extremities. No joint swelling   Assessment/Plan: 1. Functional deficits which require 3+ hours per day of interdisciplinary therapy in a comprehensive inpatient rehab setting. Physiatrist is providing close team supervision and 24 hour management of active medical problems listed below. Physiatrist and rehab team continue to assess barriers to discharge/monitor patient progress toward functional and medical goals  Care Tool:  Bathing    Body parts bathed by patient: Left arm, Right arm, Chest, Abdomen, Front perineal area, Buttocks, Right upper leg, Left upper leg, Right lower leg, Left lower leg, Face   Body parts bathed by helper: Right arm, Left arm, Chest, Abdomen     Bathing assist Assist  Level: Supervision/Verbal cueing     Upper Body Dressing/Undressing Upper body dressing   What is the patient wearing?: Pull over shirt    Upper body assist Assist Level: Set up assist    Lower Body Dressing/Undressing Lower body dressing      What is the patient wearing?: Pants, Underwear/pull up     Lower body assist Assist for lower body dressing: Supervision/Verbal cueing     Toileting Toileting    Toileting assist Assist for toileting: Contact Guard/Touching assist     Transfers Chair/bed transfer  Transfers assist  Chair/bed transfer activity did not occur: Safety/medical  concerns  Chair/bed transfer assist level: Supervision/Verbal cueing     Locomotion Ambulation   Ambulation assist      Assist level: Supervision/Verbal cueing Assistive device: No Device Max distance: >400 ft   Walk 10 feet activity   Assist     Assist level: Supervision/Verbal cueing Assistive device: No Device   Walk 50 feet activity   Assist Walk 50 feet with 2 turns activity did not occur: Safety/medical concerns  Assist level: Supervision/Verbal cueing Assistive device: No Device    Walk 150 feet activity   Assist Walk 150 feet activity did not occur: Safety/medical concerns  Assist level: Supervision/Verbal cueing Assistive device: No Device    Walk 10 feet on uneven surface  activity   Assist Walk 10 feet on uneven surfaces activity did not occur: Safety/medical concerns   Assist level: Contact Guard/Touching assist     Wheelchair     Assist Is the patient using a wheelchair?: Yes Type of Wheelchair: Manual Wheelchair activity did not occur: Safety/medical concerns  Wheelchair assist level: Supervision/Verbal cueing Max wheelchair distance: >400 ft    Wheelchair 50 feet with 2 turns activity    Assist    Wheelchair 50 feet with 2 turns activity did not occur: Safety/medical concerns   Assist Level: Supervision/Verbal cueing   Wheelchair 150 feet activity     Assist  Wheelchair 150 feet activity did not occur: Safety/medical concerns   Assist Level: Supervision/Verbal cueing   Blood pressure (!) 110/58, pulse 76, temperature 98.4 F (36.9 C), temperature source Oral, resp. rate 18, height 5\' 10"  (1.778 m), weight 75 kg, SpO2 99 %.  Medical Problem List and Plan: 1. 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  BCVI of R-ICA with pseudoaneurysm/Antithrombotics: -DVT/anticoagulation:  Pharmaceutical: Lovenox             -antiplatelet therapy: ASA/Brillinta  3. Pain Management: Gabapentin 300 mg TID w/Tylenol qid.               --oxycodone prn  -robaxin prn 12/30-decrease gabapentin to 300mg  bid--can wean off prior to dc 4. Mood/sleep-wake: Neuropsych/LCSW to follow for evaluation when appropriate.              -continue sleep chart             -contin scheduled trazodone 50mg  qhs scheduled  12/24 -dc'ed klonopin             -  hold on stimulant for now given improvement             -antipsychotic agents: seroquel. Continue at 12.5mg  qhs.  -Dr. Kieth Brightly following for coping skills as pt is struggling with loss of his girlfriend 5. Neuropsych: This patient is not capable of making decisions on his own behalf. 6. Skin/Wound Care: Routine pressure relief measures.  7. Fluids/Electrolytes/Nutrition:  12/30 intake fair, pushing po, family bringing in food  -recheck bmet and prealbumin on Monday  8. Tachycardia: Monitor HR TID--continue Metoprolol bid. See below              Vitals:   07/11/21 0534 07/11/21 0758  BP: (!) 103/53 (!) 110/58  Pulse: 74 76  Resp: 18   Temp: 98.4 F (36.9 C)   SpO2: 99%     9. Acute blood loss anemia: improved Hgb stable >12 10. Urinary retention:              --Started on Silodosin on 12/16.--dc d/t syncopal episode   12/30 voiding well  -dc urecholine and observe 11. Hypernatremia w/AKI:               -resolved 12. Leukocytosis: gradually improving   14. Facial fractures: Follow up with Dr. Annalee Genta on outpatient basis. 15. Possible low grade aortic injury: Follow up with Dr. Lenell Antu in the future 16. 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            -showing nice  improvement!  17,  Dysphagia- advanced to D2/Nectar diet per SLP  -see #7 18. Syncopal episode (x 2) likely d/t prerenal azotemia.   -received IVF earlier in week  -dc'ed norvasc  -TEDS/ABD binder when up  -12/25 reduced metoprolol to 25mg  bid  -12/30 bp/hr holding--continue current metoprolol   -needs to be drinking! 19. Loose stool: generally improved  -lomotil prn  -added fiber to regimen 20. Left ICA pseudoaneurysm s/p stent  -INR requested CTA for 07/14/21.    -f/u plan pending scan findings   LOS: 14 days A FACE TO FACE EVALUATION WAS PERFORMED  09/11/21 07/11/2021, 9:57 AM

## 2021-07-11 NOTE — Progress Notes (Signed)
Physical Therapy Session Note  Patient Details  Name: Jason [name] MRN: [id number] Date of Birth: 27-Mar-2001  Today's Date: 07/11/2021 PT Individual Time: 1101-1200 PT Individual Time Calculation (min): 59 min   Short Term Goals: Week 2:  PT Short Term Goal 1 (Week 2): Patient will improved Berg Balance Scale score by 8 points to meet MCID. PT Short Term Goal 2 (Week 2): Patient will perform basic transfers with CGA consistently. PT Short Term Goal 3 (Week 2): Patient will ambulate >1300 feet on 6 Min Walk Test to meet MCID with CGA. PT Short Term Goal 4 (Week 2): Patient will be able to complete 5 Time Sit to Stand without use of upper extremities for score >0.  Skilled Therapeutic Interventions/Progress Updates:    Pt presents supine in bed and agreeable to therapy.  Pt transfers sup to sit w/ independence and donned shoes after given.  Pt transfers sit to stand throughout session from all surfaces - arm chair, armless chair, mat table, bed - w/ supervision.  Pt amb throughout session > 300' w/o AD and close supervision.  Pt required verbal cues for safe turns, pt tends to turn quickly and step backwards.    Pt performed multiple sit to stand transfers w/o UE support, holding ball 12 reps in 30 seconds, w/ noted slowing at end 2/2 fatigue/SOB.  Pt performed standing volleyball setting of "kickball"  3 x 10 w/o c/o dizziness, dribbling (soccer-style) kickball x 2 across room and back, 1 episode of LOB when reaching out for ball.  Pt stood on wedge cushion x 2' x 2 trials, w/ fatigue stated.  Pt amb up ramp and across mulch w/ CGA, including stepping down 6" step.  Pt requires occasional seated rest breaks.  Pt performed Nu-step at Level 4 x 5' w/ LEs only and 5' using UEs as well to maintain reciprocal, fluid motion w/o hard stops at end ranges. Pt returned to room and transferred sit to supine in bed w/ independence.  Bed alarm on and all needs in reach.  Therapy Documentation Precautions:   Precautions Precautions: Fall Precaution Comments: NG tube Restrictions Weight Bearing Restrictions: Yes RLE Weight Bearing: Weight bearing as tolerated General:   Vital Signs:  Pain:0/10      Therapy/Group: Individual Therapy  Lucio Edward 07/11/2021, 12:35 PM

## 2021-07-11 NOTE — Progress Notes (Signed)
Patient ID: Jason [name], male   DOB: 10/31/2000, 20 y.o.   MRN: [id number]  SW received updates from pt mother that preferred outpatient location is [facility].   *SW left message for officer [phone]) requesting fax number.    Cecile Sheerer, MSW, LCSWA Office: [phone] Cell: [phone] Fax: [phone]

## 2021-07-11 NOTE — Progress Notes (Signed)
Pt slept peacefully all night.

## 2021-07-12 MED ORDER — NAPHAZOLINE-GLYCERIN 0.012-0.25 % OP SOLN
1.0000 [drp] | Freq: Four times a day (QID) | OPHTHALMIC | Status: DC | PRN
Start: 2021-07-12 — End: 2021-07-14
  Filled 2021-07-12: qty 15

## 2021-07-12 MED ORDER — POLYVINYL ALCOHOL 1.4 % OP SOLN
1.0000 [drp] | OPHTHALMIC | Status: DC | PRN
  Filled 2021-07-12: qty 15

## 2021-07-12 NOTE — Progress Notes (Addendum)
Pt complains of stomach cramping after taking multi-vit x2 days. Christophor Eick [place]

## 2021-07-13 DIAGNOSIS — S069X2S Unspecified intracranial injury with loss of consciousness of 31 minutes to 59 minutes, sequela: Secondary | ICD-10-CM

## 2021-07-13 NOTE — Plan of Care (Signed)
Problem: RH Balance Goal: LTG Patient will maintain dynamic sitting balance (PT) Description: LTG:  Patient will maintain dynamic sitting balance with assistance during mobility activities (PT) Outcome: Completed/Met Goal: LTG Patient will maintain dynamic standing balance (PT) Description: LTG:  Patient will maintain dynamic standing balance with assistance during mobility activities (PT) Outcome: Completed/Met   Problem: Sit to Stand Goal: LTG:  Patient will perform sit to stand with assistance level (PT) Description: LTG:  Patient will perform sit to stand with assistance level (PT) Outcome: Completed/Met   Problem: RH Bed Mobility Goal: LTG Patient will perform bed mobility with assist (PT) Description: LTG: Patient will perform bed mobility with assistance, with/without cues (PT). Outcome: Completed/Met   Problem: RH Ambulation Goal: LTG Patient will ambulate in controlled environment (PT) Description: LTG: Patient will ambulate in a controlled environment, # of feet with assistance (PT). Flowsheets Taken 07/13/2021 2005 LTG: Ambulation distance in controlled environment: >1300 ft during 6MWT for improved endurance with community level mobility Taken 06/28/2021 1833 LTG: Pt will ambulate in controlled environ  assist needed:: Supervision/Verbal cueing Goal: LTG Patient will ambulate in home environment (PT) Description: LTG: Patient will ambulate in home environment, # of feet with assistance (PT). Flowsheets (Taken 07/13/2021 2005) LTG: Pt will ambulate in home environ  assist needed:: Independent LTG: Ambulation distance in home environment: 50 ft   Problem: RH Stairs Goal: LTG Patient will ambulate up and down stairs w/assist (PT) Description: LTG: Patient will ambulate up and down # of stairs with assistance (PT) Flowsheets Taken 07/13/2021 2005 LTG: Pt will ambulate up/down stairs assist needed:: Supervision/Verbal cueing Taken 06/28/2021 1833 LTG: Pt will  ambulate up  and down number of stairs: 3 steps with R rail for home entry, full flight with 1 rail for second floor access at home

## 2021-07-13 NOTE — Progress Notes (Incomplete)
Physical Therapy Discharge Summary  Patient Details  Name: Ryshawn Sanzone MRN: [id number] Date of Birth: May 07, 2001  {CHL IP REHAB PT TIME CALCULATION:[id number]}   Patient has met {NUMBERS 0-12:18577} of 14 long term goals due to improved activity tolerance, improved balance, improved postural control, increased strength, decreased pain, ability to compensate for deficits, improved attention, improved awareness, and improved coordination.  Patient to discharge at an ambulatory level Supervision without an AD.   Patient's care partner is independent to provide the necessary cognitive assistance at discharge.  Reasons goals not met: ***  Recommendation:  Patient will benefit from ongoing skilled PT services in outpatient setting to continue to advance safe functional mobility, address ongoing impairments in dynamic balance and gait, activity tolerance, dual task training, community integration, visual scanning, patient/caregiver education, and minimize fall risk.  Equipment: No equipment provided  Reasons for discharge: treatment goals met  Patient/family agrees with progress made and goals achieved: Yes  PT Discharge Precautions/Restrictions   Vital Signs Therapy Vitals Temp: 97.8 F (36.6 C) Temp Source: Oral Pulse Rate: (!) 56 Resp: 16 BP: (!) 109/56 Patient Position (if appropriate): Lying Oxygen Therapy SpO2: 100 % O2 Device: Room Air Pain   Pain Interference   Vision/Perception     Cognition   Sensation   Motor     Mobility   Locomotion     Trunk/Postural Assessment     Balance   Extremity Assessment             Shahid Flori L Delorese Sellin 07/13/2021, 8:01 PM

## 2021-07-13 NOTE — Progress Notes (Addendum)
Physical Therapy Weekly Progress Note  Patient Details  Name: Jason [name] MRN: [id number] Date of Birth: 12/24/2000  Beginning of progress report period: July 07, 2021 End of progress report period: July 13, 2021  Today's Date: 07/13/2021 PT Individual Time: 0800-0910 PT Individual Time Calculation (min): 70 min   Patient has met 3 of 4 short term goals. He has made excellent progress this week progressing to supervision with mobility overall and working toward community integration with outdoor mobility. Overall, the patient presents with appropriate behavior with some decreased incite into his deficits and decreased frustration tolerance with increased cognitive or strength challenges during sessions. The patient will benefit from d/c planning and family education this week and progressing to independent household mobility for reduced caregiver burden. Will also focus on setting up an HEP including dual task and dynamic balance activities.   Patient continues to demonstrate the following deficits muscle weakness, decreased cardiorespiratoy endurance, ataxia and decreased coordination, decreased visual acuity and decreased visual motor skills, decreased attention, decreased awareness, decreased problem solving, decreased memory, delayed processing, and demonstrates behaviors consistent with Rancho Level VII, and decreased standing balance, decreased postural control, and decreased balance strategies and therefore will continue to benefit from skilled PT intervention to increase functional independence with mobility.  Patient progressing toward long term goals. Continue plan of care.  PT Short Term Goals Week 2:  PT Short Term Goal 1 (Week 2): Patient will improved Berg Balance Scale score by 8 points to meet MCID. PT Short Term Goal 1 - Progress (Week 2): Met PT Short Term Goal 2 (Week 2): Patient will perform basic transfers with CGA consistently. PT Short Term Goal 2 - Progress (Week  2): Met PT Short Term Goal 3 (Week 2): Patient will ambulate >1300 feet on 6 Min Walk Test to meet MCID with CGA. PT Short Term Goal 3 - Progress (Week 2): Progressing toward goal PT Short Term Goal 4 (Week 2): Patient will be able to complete 5 Time Sit to Stand without use of upper extremities for score >0. PT Short Term Goal 4 - Progress (Week 2): Met Week 3:  PT Short Term Goal 1 (Week 3): STG=LTG due to ELOS.  Skilled Therapeutic Interventions/Progress Updates:     Patient in bed with his parents at bedside upon PT arrival. Patient alert and agreeable to PT session. Patient denied pain during session.  Patient wearing an eye patch this morning. Reports that his eye feels "cold" and "like there is air being blown into it." Reports this improves with the eye patch. Encouraged him to take breaks from the eye patch to engage in visual retraining as tolerated. Patient with eye drops at bedside.   Focused session on reassessment of outcome measures to assess patient's fall risk and endurance. Also focused on discussing patient and family goals prior to d/c and education on home safety/modifications, daily schedule, OP therapy expectations, and progressing from home life at d/c to community integration and eventual return to school.  Patient is eager to d/c home, stated, "I want to go home by Jan. 5th." Patient's parents are focused on ensuring the patient is medically safe for d/c, that they understand their role and what to expect to continue the patient's rehab progress at home and with OP therapies. Patient's mother is hopeful that the patient will be on a regular diet prior to d/c and has questions about ADLs and equipment, will pass on to OT.   Therapeutic Activity: Bed Mobility: Patient performed supine to  sit independently.  Transfers: Patient performed sit to/from stand transfers independently and stand pivot with supervision-mod I.   Gait Training:  Patient ambulated >100 feet x3 without  an AD with supervision. Provided verbal cues for visually scanning due to R eye patch, and safety awareness on turns. Patient able to recall how to navigate from his room, the day room, the gym, and back to his room without cues.  6 Min Walk Test:  Instructed patient to ambulate as quickly and as safely as possible for 6 minutes using LRAD. Patient was allowed to take standing rest breaks without stopping the test, but if the patient required a sitting rest break the clock would be stopped and the test would be over.  Results: 1259 feet (384 meters, Avg speed 1.1 m/s) with supervision without an AD Vitals before: BP 119/72, HR 77, RPE 1/10 Vitals after: BP 119/71, HR 79, RPE 4/10 Results indicate that the patient has reduced endurance with ambulation compared to age matched norms (Males <79 y.o.= 572 meters).   Neuromuscular Re-ed: Patient performed the following outcome measure assessments for dynamic standing and walking to determine present fall risk with activity: Oceanographer Test Sit to Stand: Able to stand without using hands and stabilize independently Standing Unsupported: Able to stand safely 2 minutes Sitting with Back Unsupported but Feet Supported on Floor or Stool: Able to sit safely and securely 2 minutes Stand to Sit: Sits safely with minimal use of hands Transfers: Able to transfer safely, minor use of hands Standing Unsupported with Eyes Closed: Able to stand 10 seconds safely Standing Ubsupported with Feet Together: Able to place feet together independently and stand 1 minute safely From Standing, Reach Forward with Outstretched Arm: Can reach confidently >25 cm (10") From Standing Position, Pick up Object from Floor: Able to pick up shoe safely and easily From Standing Position, Turn to Look Behind Over each Shoulder: Looks behind from both sides and weight shifts well Turn 360 Degrees: Able to turn 360 degrees safely in 4 seconds or less Standing Unsupported, Alternately  Place Feet on Step/Stool: Able to stand independently and safely and complete 8 steps in 20 seconds Standing Unsupported, One Foot in Front: Able to plae foot ahead of the other independently and hold 30 seconds Standing on One Leg: Able to lift leg independently and hold 5-10 seconds Total Score: 54/56 Patient demonstrates reduced fall risk as noted by score of 54/56 on Berg Balance Scale. (<36= high risk for falls, close to 100%; 37-45 significant >80%; 46-51 moderate >50%; 52-55 lower >25%) Score improved from 33/56 on 12/26  Five times Sit to Stand Test (FTSS) Method: Use a straight back chair with a solid seat that is 16-18 high. Ask participant to sit on the chair with arms folded across their chest.   Instructions: Stand up and sit down as quickly as possible 5 times, keeping your arms folded across your chest.   Measurement: Stop timing when the participant stands the 5th time.  TIME: ___9.3___ (in seconds)  Times > 13.6 seconds is associated with increased disability and morbidity (Guralnik, 2000) Times > 15 seconds is predictive of recurrent falls in healthy individuals aged 15 and older (Buatois, et al., 2008) Normal performance values in community dwelling individuals aged 62 and older (Bohannon, 2006): 60-69 years: 11.4 seconds 70-79 years: 12.6 seconds 80-89 years: 14.8 seconds  MCID: ? 2.3 seconds for Vestibular Disorders (Meretta, 2006) Score improved from 0 due to patient being unable to complete 5 stands without using  his hands on 12/26.  Functional Gait  Assessment Gait Level Surface: Walks 20 ft in less than 7 sec but greater than 5.5 sec, uses assistive device, slower speed, mild gait deviations, or deviates 6-10 in outside of the 12 in walkway width. Change in Gait Speed: Able to smoothly change walking speed without loss of balance or gait deviation. Deviate no more than 6 in outside of the 12 in walkway width. Gait with Horizontal Head Turns: Performs head  turns smoothly with slight change in gait velocity (eg, minor disruption to smooth gait path), deviates 6-10 in outside 12 in walkway width, or uses an assistive device. Gait with Vertical Head Turns: Performs task with slight change in gait velocity (eg, minor disruption to smooth gait path), deviates 6 - 10 in outside 12 in walkway width or uses assistive device Gait and Pivot Turn: Pivot turns safely within 3 sec and stops quickly with no loss of balance. Step Over Obstacle: Is able to step over 2 stacked shoe boxes taped together (9 in total height) without changing gait speed. No evidence of imbalance. Gait with Narrow Base of Support: Ambulates 7-9 steps. Gait with Eyes Closed: Walks 20 ft, slow speed, abnormal gait pattern, evidence for imbalance, deviates 10-15 in outside 12 in walkway width. Requires more than 9 sec to ambulate 20 ft. Ambulating Backwards: Walks 20 ft, uses assistive device, slower speed, mild gait deviations, deviates 6-10 in outside 12 in walkway width. Steps: Alternating feet, no rail. Total Score: 23/30 Patient demonstrates reduced fall risk as noted by score of 23/30 on  Functional Gait Assessment.  (<19=increased fall risk with dynamic gait) Patient improved score from 0/30 on 12/26.  Patient sitting EOB with his parents in the room at end of session with breaks locked, and all needs within reach.   ABS 14 Therapy Documentation Precautions:  Precautions Precautions: Fall Precaution Comments: NG tube Restrictions Weight Bearing Restrictions: No RLE Weight Bearing: Weight bearing as tolerated   Therapy/Group: Individual Therapy  Douglass Dunshee L Lenton Gendreau PT, DPT  07/13/2021, 12:33 PM
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
## 2021-07-14 ENCOUNTER — Inpatient Hospital Stay (HOSPITAL_COMMUNITY): Payer: BC Managed Care – PPO

## 2021-07-14 DIAGNOSIS — I7771 Dissection of carotid artery: Secondary | ICD-10-CM

## 2021-07-14 LAB — BASIC METABOLIC PANEL
Anion gap: 5 (ref 5–15)
BUN: 13 mg/dL (ref 6–20)
CO2: 25 mmol/L (ref 22–32)
Calcium: 9.2 mg/dL (ref 8.9–10.3)
Chloride: 107 mmol/L (ref 98–111)
Creatinine, Ser: 0.91 mg/dL (ref 0.61–1.24)
GFR, Estimated: >60 mL/min (ref >60)
Glucose, Bld: 101 mg/dL — ABNORMAL HIGH (ref 70–99)
Potassium: 4.2 mmol/L (ref 3.5–5.1)
Sodium: 137 mmol/L (ref 135–145)

## 2021-07-14 LAB — CBC
HCT: 32.4 % — ABNORMAL LOW (ref 39.0–52.0)
Hemoglobin: 10.5 g/dL — ABNORMAL LOW (ref 13.0–17.0)
MCH: 29.3 pg (ref 26.0–34.0)
MCHC: 32.4 g/dL (ref 30.0–36.0)
MCV: 90.5 fL (ref 80.0–100.0)
Platelets: 192 10*3/uL (ref 150–400)
RBC: 3.58 MIL/uL — ABNORMAL LOW (ref 4.22–5.81)
RDW: 14.4 % (ref 11.5–15.5)
WBC: 7.5 10*3/uL (ref 4.0–10.5)
nRBC: 0 % (ref 0.0–0.2)
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
## 2021-07-14 MED ORDER — IOHEXOL 350 MG/ML SOLN
75.0000 mL | Freq: Once | INTRAVENOUS | Status: AC | PRN
Start: 2021-07-14 — End: 2021-07-14
  Administered 2021-07-14: 75 mL via INTRAVENOUS

## 2021-07-14 MED ORDER — POLYVINYL ALCOHOL 1.4 % OP SOLN
2.0000 [drp] | Freq: Four times a day (QID) | OPHTHALMIC | Status: DC
  Administered 2021-07-14: 2 [drp] via OPHTHALMIC
  Filled 2021-07-14: qty 15

## 2021-07-14 MED ORDER — DIPHENHYDRAMINE HCL 12.5 MG/5ML PO ELIX: 12.5000 mg | ORAL_SOLUTION | Freq: Four times a day (QID) | ORAL | Status: DC | PRN

## 2021-07-14 MED ORDER — QUETIAPINE FUMARATE 25 MG PO TABS: 12.5000 mg | ORAL_TABLET | Freq: Two times a day (BID) | ORAL | Status: DC | PRN

## 2021-07-14 MED ORDER — ASPIRIN EC 81 MG PO TBEC
81.0000 mg | DELAYED_RELEASE_TABLET | Freq: Every day | ORAL | Status: DC
  Administered 2021-07-15 – 2021-07-16 (×2): 81 mg via ORAL
  Filled 2021-07-14 (×3): qty 1

## 2021-07-14 MED ORDER — METHOCARBAMOL 500 MG PO TABS: 500.0000 mg | ORAL_TABLET | Freq: Three times a day (TID) | ORAL | Status: DC | PRN

## 2021-07-14 MED ORDER — TRAZODONE HCL 50 MG PO TABS
50.0000 mg | ORAL_TABLET | Freq: Every evening | ORAL | Status: DC | PRN
  Administered 2021-07-15: 50 mg via ORAL
  Filled 2021-07-14: qty 1

## 2021-07-14 MED ORDER — METOPROLOL TARTRATE 12.5 MG HALF TABLET
12.5000 mg | ORAL_TABLET | Freq: Two times a day (BID) | ORAL | Status: DC
  Administered 2021-07-14 – 2021-07-15 (×3): 12.5 mg via ORAL
  Filled 2021-07-14 (×3): qty 1

## 2021-07-14 MED ORDER — TRAZODONE HCL 50 MG PO TABS: 50.0000 mg | ORAL_TABLET | Freq: Every day | ORAL | Status: DC

## 2021-07-14 MED ORDER — FAMOTIDINE 20 MG PO TABS: 10.0000 mg | ORAL_TABLET | Freq: Two times a day (BID) | ORAL | Status: DC | PRN

## 2021-07-14 MED ORDER — GABAPENTIN 100 MG PO CAPS
100.0000 mg | ORAL_CAPSULE | Freq: Two times a day (BID) | ORAL | Status: DC
  Administered 2021-07-14 – 2021-07-15 (×3): 100 mg via ORAL
  Filled 2021-07-14 (×3): qty 1

## 2021-07-14 MED ORDER — ARTIFICIAL TEARS OPHTHALMIC OINT
TOPICAL_OINTMENT | Freq: Every day | OPHTHALMIC | Status: DC
  Filled 2021-07-14: qty 3.5

## 2021-07-14 NOTE — Progress Notes (Signed)
Patient ID: Jason [name], male   DOB: 22-Sep-2000, 22 y.o.   MRN: [id number]  Per therapy team, parents would like a family meeting, and discussed possible change in his d/c date. Covering physician will meet with family today. SW will discuss with pt parents  further.   *Therapy team and SW plan to meet on Wednesday (1/4) at 10am to discuss discharge planning.   Loralee Pacas, MSW, [place] Office: [phone] Cell: [phone] Fax: [phone]

## 2021-07-14 NOTE — Plan of Care (Signed)
Goals upgraded d/t pt progress  Problem: Sit to Stand Goal: LTG:  Patient will perform sit to stand in prep for activites of daily living with assistance level (OT) Description: LTG:  Patient will perform sit to stand in prep for activites of daily living with assistance level (OT) Flowsheets (Taken 07/14/2021 1235) LTG: PT will perform sit to stand in prep for activites of daily living with assistance level: (upgraded 1/2- SD) Independent   Problem: RH Grooming Goal: LTG Patient will perform grooming w/assist,cues/equip (OT) Description: LTG: Patient will perform grooming with assist, with/without cues using equipment (OT) Flowsheets (Taken 07/14/2021 1235) LTG: Pt will perform grooming with assistance level of: (upgraded 1/2- SD) Independent   Problem: RH Dressing Goal: LTG Patient will perform upper body dressing (OT) Description: LTG Patient will perform upper body dressing with assist, with/without cues (OT). Flowsheets (Taken 07/14/2021 1235) LTG: Pt will perform upper body dressing with assistance level of: (upgraded 1/2- SD) Independent Goal: LTG Patient will perform lower body dressing w/assist (OT) Description: LTG: Patient will perform lower body dressing with assist, with/without cues in positioning using equipment (OT) Flowsheets (Taken 07/14/2021 1235) LTG: Pt will perform lower body dressing with assistance level of: (upgraded 1/2- SD) Independent   Problem: RH Toileting Goal: LTG Patient will perform toileting task (3/3 steps) with assistance level (OT) Description: LTG: Patient will perform toileting task (3/3 steps) with assistance level (OT)  Flowsheets (Taken 07/14/2021 1235) LTG: Pt will perform toileting task (3/3 steps) with assistance level: (upgraded 1/2- SD) Independent   Problem: RH Vision Goal: RH LTG Vision (Specify) Outcome: Completed/Met   Problem: RH Functional Use of Upper Extremity Goal: LTG Patient will use RT/LT upper extremity as a (OT) Description: LTG:  Patient will use right/left upper extremity as a stabilizer/gross assist/diminished/nondominant/dominant level with assist, with/without cues during functional activity (OT) Outcome: Completed/Met   Problem: RH Toilet Transfers Goal: LTG Patient will perform toilet transfers w/assist (OT) Description: LTG: Patient will perform toilet transfers with assist, with/without cues using equipment (OT) Flowsheets (Taken 07/14/2021 1235) LTG: Pt will perform toilet transfers with assistance level of: (upgraded 1/2- SD) Independent

## 2021-07-14 NOTE — Progress Notes (Signed)
Occupational Therapy Weekly Progress Note  Patient Details  Name: Jason [name] MRN: [id number] Date of Birth: 13-Jul-2001  Beginning of progress report period: 07/07/21 End of progress report period: 07/14/21   Patient has met 4 of 4 short term goals.  Jason [name] continues to make excellent progress. His behaviors are consistent with a RLAS VII-VIII. He demonstrates reduced insight into deficits and will continue to require 24/7 supervision for cognitive deficits. He is now able to perform his ADL routine at a supervision level with occasional cueing for safety. He demonstrates no discernable difference in R vs L strength in the UE. His parents have been incredibly supportive and present for ongoing family education. A family meeting will be held tomorrow to discuss CLOF and goals moving forward as we approach d/c.   Patient continues to demonstrate the following deficits: muscle weakness, decreased awareness, decreased problem solving, decreased safety awareness, delayed processing, and demonstrates behaviors consistent with Rancho Level VII-VIII, and decreased standing balance, decreased postural control, and decreased balance strategies and therefore will continue to benefit from skilled OT intervention to enhance overall performance with BADL, iADL, and School/Education.  Patient progressing toward long term goals..  Plan of care revisions: Several goals upgraded to mod I to reflect pt progress.  OT Short Term Goals Week 2:  OT Short Term Goal 1 (Week 2): Pt will sequence LB dressing with no more than mod A/cueing OT Short Term Goal 1 - Progress (Week 2): Met OT Short Term Goal 2 (Week 2): Pt will sustain attention to challenging cognitive task for 1 min with no more than min cueing OT Short Term Goal 2 - Progress (Week 2): Met OT Short Term Goal 3 (Week 2): Pt will require no more than min cueing for thoroughness with bathing OT Short Term Goal 3 - Progress (Week 2): Met OT Short Term Goal 4  (Week 2): Pt will complete toilet transfer with min A OT Short Term Goal 4 - Progress (Week 2): Met Week 3:  OT Short Term Goal 1 (Week 3): STG= LTG d/t ELOS   Curtis Sites 07/14/2021, 7:21 AM
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
## 2021-07-14 NOTE — Progress Notes (Signed)
Physical Therapy TBI Note  Patient Details  Name: Jason [name] MRN: [id number] Date of Birth: 12-Jan-2001  Today's Date: 07/14/2021 PT Individual Time: 1331-1430 PT Individual Time Calculation (min): 59 min   Short Term Goals: Week 3:  PT Short Term Goal 1 (Week 3): STG=LTG due to ELOS.  Skilled Therapeutic Interventions/Progress Updates:     Pt received working with OT in dayroom. Handed off to PT. No complaint of pain. Pt ambulates with CGA with cues for navigation, also tasked with pathfinding, walking down stairs to 1st level to access outdoors for ambulation. Pt descends x3 flights of stairs with CGA and cues for step sequencing and importance of taking time due to pt initially taking stairs very quickly. Pt then ambulates outdoors for overground gait training over varying and unlevel surfaces. Pt also completes stairs outdoors with cues for safety, cross multiple streets with cues to stop and look both ways with pt demonstrating good carryover. Pt then ambulates back to department, up 3 flights of stairs using L hand rail.   Pt completes Wii golf game for NMR, challenging standing balance with cognitive overlay, as well as performing high amplitude dynamic upper extremity movemetns combined with fine motor coordination manipulating controller. Wii provides pt with visual feedback on performance and PT provides cues for body mechanics and incorporating and synthesizing multiple forms of information to make decisions.   Pt ambulates back to room. Left seated at EOB with parents present.  Therapy Documentation Precautions:  Precautions Precautions: Fall Precaution Comments: fall, cognition Restrictions Weight Bearing Restrictions: No RLE Weight Bearing: Weight bearing as tolerated Agitated Behavior Scale: TBI Observation Details Observation Environment: CIR Start of observation period - Date: 07/14/21 Start of observation period - Time: 1331 End of observation period - Date:  07/14/21 End of observation period - Time: 1430 Agitated Behavior Scale (DO NOT LEAVE BLANKS) Short attention span, easy distractibility, inability to concentrate: Absent Impulsive, impatient, low tolerance for pain or frustration: Absent Uncooperative, resistant to care, demanding: Absent Violent and/or threatening violence toward people or property: Absent Explosive and/or unpredictable anger: Absent Rocking, rubbing, moaning, or other self-stimulating behavior: Absent Pulling at tubes, restraints, etc.: Absent Wandering from treatment areas: Absent Restlessness, pacing, excessive movement: Absent Repetitive behaviors, motor, and/or verbal: Absent Rapid, loud, or excessive talking: Absent Sudden changes of mood: Absent Easily initiated or excessive crying and/or laughter: Absent Self-abusiveness, physical and/or verbal: Absent Agitated behavior scale total score: 14    Therapy/Group: Individual Therapy  Beau Fanny, PT, DPT 07/14/2021, 3:14 PM

## 2021-07-14 NOTE — Progress Notes (Signed)
Occupational Therapy TBI Note  Patient Details  Name: Jason [name] MRN: [id number] Date of Birth: [birth year]  Today's Date: 07/14/2021 OT Individual Time: 1000-1045 OT Individual Time Calculation (min): 45 min    Session 2:  OT Individual Time: 1306-1330 OT Individual Time Calculation (min): 24 min    Short Term Goals: Week 2:  OT Short Term Goal 1 (Week 2): Pt will sequence LB dressing with no more than mod A/cueing OT Short Term Goal 1 - Progress (Week 2): Met OT Short Term Goal 2 (Week 2): Pt will sustain attention to challenging cognitive task for 1 min with no more than min cueing OT Short Term Goal 2 - Progress (Week 2): Met OT Short Term Goal 3 (Week 2): Pt will require no more than min cueing for thoroughness with bathing OT Short Term Goal 3 - Progress (Week 2): Met OT Short Term Goal 4 (Week 2): Pt will complete toilet transfer with min A OT Short Term Goal 4 - Progress (Week 2): Met  Skilled Therapeutic Interventions/Progress Updates:   Session 1:   Pt received supine with no c/o pain. Agreeable to shower. Pt appropriately gathered items for shower with (S). Min cueing to hold on/or sit to doff pants. He transferred into the walk in shower with (S). Bathing completed in standing with distant (S). Pt fully oriented. Min cueing for seated level donning pants and for thoroughness when drying off. He completed oral care in standing with (S). (S) for 100 ft of functional mobility to the therapy gym. He completed seated level cognitive task, focused on sequencing, spatial orientation, and working memory. Had pt complete this task fully independently with OT "checking his work", to simulate school tasks. He scored 90% and had improved awareness of ability- however when questioned about return to school he claimed physical deficits would be his only limitation. Discussed memory and processing speed deficits frankly. Pt returned to his room and was left EOB with all needs met. Parents  present.   Session 2: Pt received EOB finishing up lunch. No c/o pain. He completed 100 ft of functional mobility with (S) to the therapy gym. 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 He then completed therapeutic activity on the mat, in quadroped, to address core stabilization, coordination, and strengthening. He required min A when completing "bird dogs" in quadroped- lifting opposite arm and leg. He was passed off to PT in the gym from [facility]. ABS 14   Therapy Documentation Precautions:  Precautions Precautions: Fall Precaution Comments: fall, cognition Restrictions Weight Bearing Restrictions: No RLE Weight Bearing: Weight bearing as tolerated   Agitated Behavior Scale: TBI Observation Details Observation Environment: CIR Start of observation period - Date: 07/14/21 Start of observation period - Time: 1000 End of observation period - Date: 07/14/21 End of observation period - Time: 1045 Agitated Behavior Scale (DO NOT LEAVE BLANKS) Short attention span, easy distractibility, inability to concentrate: Absent Impulsive, impatient, low tolerance for pain or frustration: Absent Uncooperative, resistant to care, demanding: Absent Violent and/or threatening violence toward people or property: Absent Explosive and/or unpredictable anger: Absent Rocking, rubbing, moaning, or other self-stimulating behavior: Absent Pulling at tubes, restraints, etc.: Absent Wandering from treatment areas: Absent Restlessness, pacing, excessive movement: Absent Repetitive behaviors, motor, and/or verbal: Absent Rapid, loud, or excessive talking: Absent Sudden changes of mood: Absent Easily initiated or excessive crying and/or laughter: Absent Self-abusiveness, physical and/or verbal: Absent Agitated behavior scale total score: 14  Therapy/Group: Individual Therapy  Curtis Sites 07/14/2021, 12:38 PM
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
## 2021-07-15 NOTE — Progress Notes (Signed)
Physical Therapy TBI Note  Patient Details  Name: Jason [name] MRN: [id number] Date of Birth: Oct 09, 2000  Today's Date: 07/15/2021 PT Individual Time: 0905-1002 PT Individual Time Calculation (min): 57 min   Short Term Goals: Week 1:  PT Short Term Goal 1 (Week 1): Patient will perform bed mobility with min A >50% of the time with or without hospital bed features. PT Short Term Goal 1 - Progress (Week 1): Met PT Short Term Goal 2 (Week 1): Patient will perform basic transfers with min A >50% of the time using LRAD. PT Short Term Goal 2 - Progress (Week 1): Met PT Short Term Goal 3 (Week 1): Patient will ambulate >50 feet using LRAD with mod A. PT Short Term Goal 3 - Progress (Week 1): Met  Skilled Therapeutic Interventions/Progress Updates:    Pt received supine in bed with his mother present and pt agreeable to therapy session. Pt wearing eye patch on R eye stating he was having significant pain in R eye this morning and reports it was feeling "cold" and has "light sensitivity" and that the eye patch makes it feel better - pt reports he is using the eye drops as prescribed. Supine>sitting L EOB supervision. Sit<>stands, no AD, with close supervision.   Gait ~124f to main therapy gym, no AD, with CGA for safety - reciprocal gait pattern with no significant instability or gait deviation noted.   Dynamic standing balance with dual-task challenge via 4-square foot tapping to numbered disks on verbal command progressed to 2 number verbal cues for short term recall and motor planning progressed to stepping to target for stepping balance retraining then to turning 90degrees and having to recall the location of the numbers - requires CGA/min assist for steadying/balance with a couple minor posterior LOB and mild delay in motor planning.   MD in/out for morning assessment - recommends potentially using eye shield and will put in an order.   Dynamic gait training using agility ladder including: -  forward reciprocal stepping - side stepping with dual task challenge of counting up from 0 and then down from 40 by 2 - steps mildly slower with dual-task challenge - 2 feet in 2 feet out with dual task challenge of naming colors , pt with difficulty doing this and becomes slightly irritated with this task stating he doesn't know that many colors - backwards stepping, step-to and then reciprocal pattern  All with CGA and intermittent light min assist for balance  Dynamic gait training through obstacle course including weaving through cones, side stepping over cones, and stepping on/off 4" and 6" steps without UE support in a sequence that pt has to recall - CGA/light min assist for balance - pt with 2x pauses to recall the sequence of the course.   Dual-task of completing 2 different puzzles alternating between each and doing a ~24fwalk between each move - puzzles are: ping pong picture match and 3 clothespin sequence pattern - pt has to look at last piece to remember what color clothespin comes next in the sequence and has to use strategy of stating the sequence outloud to improve recall.    Gait back to room as described above. Pt left seated on EOB with his parents present.   Therapy Documentation Precautions:  Precautions Precautions: Fall Precaution Comments: fall, cognition Restrictions Weight Bearing Restrictions: No RLE Weight Bearing: Weight bearing as tolerated    Pain:   Reports R eye pain - details above - pt using eye patch and eye  drops for pain management. Reports some discomfort at IV site, nurse and MD aware, no further intervention available at this time.  Agitated Behavior Scale: TBI Observation Details Observation Environment: CIR Start of observation period - Date: 07/15/21 Start of observation period - Time: 0905 End of observation period - Date: 07/15/21 End of observation period - Time: 1002 Agitated Behavior Scale (DO NOT LEAVE BLANKS) Short attention  span, easy distractibility, inability to concentrate: Present to a slight degree Impulsive, impatient, low tolerance for pain or frustration: Absent Uncooperative, resistant to care, demanding: Absent Violent and/or threatening violence toward people or property: Absent Explosive and/or unpredictable anger: Absent Rocking, rubbing, moaning, or other self-stimulating behavior: Absent Pulling at tubes, restraints, etc.: Absent Wandering from treatment areas: Absent Restlessness, pacing, excessive movement: Present to a slight degree Repetitive behaviors, motor, and/or verbal: Absent Rapid, loud, or excessive talking: Absent Sudden changes of mood: Absent Easily initiated or excessive crying and/or laughter: Absent Self-abusiveness, physical and/or verbal: Absent Agitated behavior scale total score: 16     Therapy/Group: Individual Therapy  Tawana Scale , PT, DPT, NCS, CSRS  07/15/2021, 7:52 AM

## 2021-07-15 NOTE — Progress Notes (Signed)
Physical Therapy Note  Patient Details  Name: Jason [name] MRN: [id number] Date of Birth: August 28, 2000 Today's Date: 07/15/2021   Attempted to see patient for scheduled therapy session, pt declines participation at this time due to fatigue from a busy day of therapy and emotions regarding scheduled surgery tomorrow. Pt left supine in bed with parents present. Pt missed 45 min of scheduled therapy session due to refusal at this time.   Peter Congo, PT, DPT, CSRS  07/15/2021, 4:27 PM

## 2021-07-15 NOTE — Progress Notes (Signed)
Physical Therapy TBI Note  Patient Details  Name: Jason [name] MRN: [id number] Date of Birth: Mar 23, 2001  Today's Date: 07/15/2021 PT Individual Time: 1352-1434 PT Individual Time Calculation (min): 42 min   Short Term Goals: Week 3:  PT Short Term Goal 1 (Week 3): STG=LTG due to ELOS.  Skilled Therapeutic Interventions/Progress Updates:    Pt received supine in bed with his parents present and pt appears upset/sad, but agreeable to therapy session and requests to go outside for change of scenery. Followed up with nursing regarding possibly getting an eye shield - nurse to continue following up - later nurse reported that none available therefore will plan to place gauze pad and tape or use eye patch per PA. Supine>sitting L EOB independently. Sit<>stands, no AD, independently throughout session. Gait training >1,068ft, no AD, including on/off elevators to outside with close supervision for longer distance and community mobility. Gait training outside with dual-task of conversing with therapist while asking questions on topics of patient's interest testing his recall/memory and having pt recall how to navigate to/from areas outside - included unlevel surfaces, outdoor stairs using a HR, ramps, and curb navigation - requires verbal cuing to ambulate closer to center of sidewalk as pt 2x stepped off R side of curb but able to maintain balance without increased physical assist. Therapist educated pt on use of handrails with stairs at this time to ensure his safety and pt demonstrates understanding during session. Throughout session thearpist utilizing therapeutic use of self as pt confesses he was upset regarding recent medical update with need for additional "brain surgery." Therapist provides emotional support and pt presents with a normal behavioral response to his given situation. At end of session pt left sitting on EOB with his parents present.  Therapy Documentation Precautions:   Precautions Precautions: Fall Precaution Comments: fall, cognition Restrictions Weight Bearing Restrictions: No RLE Weight Bearing: Weight bearing as tolerated   Pain:   Continues to have R eye pain - continues to wear eye patch and use eye drops for relief.  Agitated Behavior Scale: TBI  Observation Details Observation Environment: CIR Start of observation period - Date: 07/15/21 Start of observation period - Time: 1352 End of observation period - Date: 07/15/21 End of observation period - Time: 1434 Agitated Behavior Scale (DO NOT LEAVE BLANKS) Short attention span, easy distractibility, inability to concentrate: Absent Impulsive, impatient, low tolerance for pain or frustration: Absent Uncooperative, resistant to care, demanding: Absent Violent and/or threatening violence toward people or property: Absent Explosive and/or unpredictable anger: Absent Rocking, rubbing, moaning, or other self-stimulating behavior: Absent Pulling at tubes, restraints, etc.: Absent Wandering from treatment areas: Absent Restlessness, pacing, excessive movement: Absent Repetitive behaviors, motor, and/or verbal: Present to a moderate degree (frequently repeated exact same phrase as response to therapist's comments throughout session) Rapid, loud, or excessive talking: Absent Sudden changes of mood: Absent Easily initiated or excessive crying and/or laughter: Absent Self-abusiveness, physical and/or verbal: Absent Agitated behavior scale total score: 16     Therapy/Group: Individual Therapy  Ginny Forth , PT, DPT, NCS, CSRS 07/15/2021, 3:12 PM
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
## 2021-07-15 NOTE — Progress Notes (Signed)
Patient resting well throughout the evening. No c/o pain or discomfort. Last BM 07/13/21.

## 2021-07-15 NOTE — Progress Notes (Addendum)
Speech Language Pathology TBI Note  Patient Details  Name: Jason [name] MRN: [id number] Date of Birth: 08-Dec-2000  Today's Date: 07/15/2021 SLP Individual Time: 0800-0850 SLP Individual Time Calculation (min): 50 min  Short Term Goals: Week 3: SLP Short Term Goal 1 (Week 3): STG=LTG due to ELOS  Skilled Therapeutic Interventions: Skilled ST treatment focused on swallowing and cognitive goals. Patient seen EOB and agreeable to consume regular texture PO trials. Patient consumed crunchy cookie with effective mastication, complete oral clearance, and no overt s/s of aspiration. He continues to masticate mainly on left side. This does not appear to impede mastication/oral prep efficiency. Recommend advancement to regular diet, thin liquids. Continue meds whole in puree. SLP educated patient and family on diet advancement and reinforced safe swallowing precautions. Pt/family verbalized understanding with teach back. Pt agreeable to ambulate with CGA to therapy gym for remainder of session. Patient completed memory task using BITS where he achieved 92% accuracy with sequential memory task by recalling up to 9 novel words in sequential order with mod I, and 82% to recall up to 8 randomized words in order with sup A verbal cues. Pt successfully utilized internal memory strategies association, repetition, and chunking to aid recall. Patient also completed alternating attention and verbal reasoning task with mod I. Pt ambulated back to room with CGA and was left in bed with alarm activated and immediate needs within reach at end of session. Continue per current plan of care.      Pain Pain Assessment Pain Scale: 0-10 Pain Score: 0-No pain  Agitated Behavior Scale: TBI Observation Details Observation Environment: CIR Start of observation period - Date: 07/15/21 Start of observation period - Time: 0800 End of observation period - Date: 07/15/21 End of observation period - Time: 0850 Agitated Behavior  Scale (DO NOT LEAVE BLANKS) Short attention span, easy distractibility, inability to concentrate: Absent Impulsive, impatient, low tolerance for pain or frustration: Absent Uncooperative, resistant to care, demanding: Absent Violent and/or threatening violence toward people or property: Absent Explosive and/or unpredictable anger: Absent Rocking, rubbing, moaning, or other self-stimulating behavior: Absent Pulling at tubes, restraints, etc.: Absent Wandering from treatment areas: Absent Restlessness, pacing, excessive movement: Absent Repetitive behaviors, motor, and/or verbal: Absent Rapid, loud, or excessive talking: Absent Sudden changes of mood: Absent Easily initiated or excessive crying and/or laughter: Absent Self-abusiveness, physical and/or verbal: Absent Agitated behavior scale total score: 14  Therapy/Group: Individual Therapy  Tamala Ser 07/15/2021, 12:39 PM
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
## 2021-07-15 NOTE — Progress Notes (Signed)
Patient ID: Jason [name], male   DOB: 10/23/2000, 21 y.o.   MRN: [id number]   SW spoke with [facility] Med [phone]) to get fax number. SW informed will need to fax to [facility] for all referrals ([phone]/[phone]). SW faxed PT/OT/SLP referral and requested neuropsych as well. *Sw confirmed referral was received and was informed to contact Neuropsych Dept (p:[phone]/f:[phone]) to submit referral.  SW spoke with Neuropsych Dept reporting once referral received, referral will be reviewed and will determine if pt is appropriate. SW faxed referral to neuropsych and waiting to confirm referral was received.   *SW later informed pt will have scheduled surgery tomorrow and will d/c to neurosurgery service. SW discussed with parents who were aware. SW informed on above about neuropsych and will follow-up once aware on if accepted.  Cecile Sheerer, MSW, LCSWA Office: [phone] Cell: [phone] Fax: [phone]

## 2021-07-15 NOTE — Progress Notes (Signed)
Occupational Therapy TBI Note  Patient Details  Name: Jason [name] MRN: [id number] Date of Birth: [birth year]  Today's Date: 07/15/2021 OT Individual Time: 1100-1200 OT Individual Time Calculation (min): 60 min    Short Term Goals: Week 3:  OT Short Term Goal 1 (Week 3): STG= LTG d/t ELOS  Skilled Therapeutic Interventions/Progress Updates:    Pt received supine, upset over IV still being in. Pt redirectable and agreeable to shower. Edu provided re new mod I goals and pt trialing showering with distant (S). Pt agreeable after OT reviewed safety considerations and cueing. He doffed clothes and completed shower at set up assist. He demonstrated appropriate safety awareness during shower. Pt donned clothes with set up assist. Pt completed 200 ft of functional mobility to the therapy gym with (S). He engaged in dynamic strengthening/balance activities on a bosu ball with close CGA provided. Cueing provided for core engagement and technique. Pt completed several sets with standing/sitting rest breaks provided as needed. HR intermittently assessed and WNL. He then completed several visual exercises- working on visual scanning, convergence, and depth perception training. Pt's R eye is now able to scan medially, up, and down, however unable to scan laterally at all. Pt returned to his room and was left sitting EOB with his parents present.   Therapy Documentation Precautions:  Precautions Precautions: Fall Precaution Comments: fall, cognition Restrictions Weight Bearing Restrictions: No RLE Weight Bearing: Weight bearing as tolerated Pain Assessment Pain Scale: 0-10 Pain Score: 0-No pain Agitated Behavior Scale: TBI Observation Details Observation Environment: CIR Start of observation period - Date: 07/15/21 Start of observation period - Time: 1100 End of observation period - Date: 07/15/21 End of observation period - Time: 1200 Agitated Behavior Scale (DO NOT LEAVE BLANKS) Short  attention span, easy distractibility, inability to concentrate: Absent Impulsive, impatient, low tolerance for pain or frustration: Absent Uncooperative, resistant to care, demanding: Absent Violent and/or threatening violence toward people or property: Absent Explosive and/or unpredictable anger: Absent Rocking, rubbing, moaning, or other self-stimulating behavior: Absent Pulling at tubes, restraints, etc.: Absent Wandering from treatment areas: Absent Restlessness, pacing, excessive movement: Absent Repetitive behaviors, motor, and/or verbal: Absent Rapid, loud, or excessive talking: Absent Sudden changes of mood: Absent Easily initiated or excessive crying and/or laughter: Absent Self-abusiveness, physical and/or verbal: Absent Agitated behavior scale total score: 14    Therapy/Group: Individual Therapy  Crissie Reese 07/15/2021, 12:19 PM
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
## 2021-07-15 NOTE — Progress Notes (Signed)
Physical Therapy Discharge Summary  Patient Details  Name: Jason [name] MRN: [id number] Date of Birth: 2000-10-01   Patient has met 40 of 14 long term goals due to improved activity tolerance, improved balance, improved postural control, increased strength, decreased pain, ability to compensate for deficits, improved attention, improved awareness, and improved coordination.  Patient to discharge at an ambulatory level Supervision without an AD.   Patient's care partner is independent to provide the necessary cognitive assistance at discharge.  Reasons goals not met: Pt has met all rehab goals.  Recommendation:  Patient will benefit from ongoing skilled PT services in outpatient setting to continue to advance safe functional mobility, address ongoing impairments in dynamic balance and gait, activity tolerance, dual task training, community integration, visual scanning, patient/caregiver education, and minimize fall risk.  Equipment: No equipment provided, none needed  Reasons for discharge: treatment goals met and d/c back to acute care for surgery  Patient/family agrees with progress made and goals achieved: Yes  PT Discharge Precautions/Restrictions Precautions Precautions: None Precaution Comments: fall, cognition Restrictions Weight Bearing Restrictions: No Pain Interference Pain Interference Pain Effect on Sleep: 1. Rarely or not at all Pain Interference with Therapy Activities: 1. Rarely or not at all Pain Interference with Day-to-Day Activities: 1. Rarely or not at all Vision/Perception  Vision - History Ability to See in Adequate Light: 1 Impaired Vision - Assessment Eye Alignment: Within Functional Limits Ocular Range of Motion: Restricted on the right Alignment/Gaze Preference: Within Defined Limits Tracking/Visual Pursuits: Right eye does not track laterally Convergence: Impaired (comment) (CN III impairment) Diplopia Assessment: Disappears with one eye closed  (occasionally present) Perception Perception: Within Functional Limits Praxis Praxis: Intact  Cognition Overall Cognitive Status: Impaired/Different from baseline Arousal/Alertness: Awake/alert Orientation Level: Oriented X4 Year: 2022 Month: January Day of Week: Correct Attention: Selective Focused Attention: Impaired Sustained Attention: Impaired Selective Attention: Appears intact Memory: Impaired Memory Impairment: Decreased recall of new information;Decreased short term memory Decreased Short Term Memory: Verbal complex;Functional complex Immediate Memory Recall:  (did not perform d/t unplanned d/c to acute) Awareness: Impaired Awareness Impairment: Anticipatory impairment Problem Solving: Impaired Safety/Judgment: Impaired Rancho Los Amigos Scales of Cognitive Functioning: Purposeful/appropriate Sensation Sensation Light Touch: Appears Intact Hot/Cold: Appears Intact Proprioception: Appears Intact Coordination Gross Motor Movements are Fluid and Coordinated: Yes Fine Motor Movements are Fluid and Coordinated: Yes Coordination and Movement Description: Still with slight posterior bias Motor  Motor Motor: Within Functional Limits  Mobility Bed Mobility Bed Mobility: Rolling Right;Rolling Left;Supine to Sit;Sit to Supine Rolling Right: Independent Rolling Left: Independent Supine to Sit: Independent Sit to Supine: Independent Transfers Transfers: Sit to Stand;Stand to Sit;Stand Pivot Transfers Sit to Stand: Independent Stand to Sit: Independent Stand Pivot Transfers: Independent Locomotion  Gait Ambulation: Yes Gait Assistance: Supervision/Verbal cueing Gait Distance (Feet): 1500 Feet Assistive device: None Gait Gait: Yes Gait Pattern: Impaired Gait Pattern: Step-through pattern;Decreased step length - left;Decreased stance time - left;Decreased stride length;Decreased hip/knee flexion - left;Decreased weight shift to left;Ataxic;Scissoring;Lateral hip  instability;Trunk flexed;Narrow base of support Gait velocity: decreased Stairs / Additional Locomotion Stairs: Yes Stairs Assistance: Supervision/Verbal cueing Stair Management Technique: Two rails;Alternating pattern Number of Stairs: 12 Height of Stairs: 6 Wheelchair Mobility Wheelchair Mobility: No  Trunk/Postural Assessment  Cervical Assessment Cervical Assessment: Within Functional Limits Thoracic Assessment Thoracic Assessment: Within Functional Limits Lumbar Assessment Lumbar Assessment: Within Functional Limits Postural Control Postural Control: Deficits on evaluation (delayed righting reactions)  Balance Balance Balance Assessed: Yes Static Sitting Balance Static Sitting - Balance Support: Feet supported;No upper extremity  supported Static Sitting - Level of Assistance: 7: Independent Dynamic Sitting Balance Dynamic Sitting - Balance Support: Feet supported;No upper extremity supported Dynamic Sitting - Level of Assistance: 7: Independent Static Standing Balance Static Standing - Balance Support: No upper extremity supported Static Standing - Level of Assistance: 7: Independent Dynamic Standing Balance Dynamic Standing - Balance Support: No upper extremity supported Dynamic Standing - Level of Assistance: 7: Independent Extremity Assessment  RLE Assessment RLE Assessment: Within Functional Limits LLE Assessment LLE Assessment: Within Functional Limits    Page Spiro, , PT, DPT, NCS, CSRS Excell Seltzer, PT, DPT, CSRS 07/15/2021, 5:24 PM

## 2021-07-15 NOTE — Progress Notes (Signed)
Occupational Therapy Discharge Summary  Patient Details  Name: Jason [name] MRN: [id number] Date of Birth: 12-07-2000   Patient has met 22 of 14 long term goals due to improved activity tolerance, improved balance, postural control, ability to compensate for deficits, functional use of  LEFT upper and LEFT lower extremity, improved attention, improved awareness, and improved coordination.  Patient to discharge at overall Modified Independent level.  Patient's care partner is independent to provide the necessary cognitive assistance at discharge.  Family education has been completed with Gabe's parents who are very supportive.   Reasons goals not met: All goals met.   Recommendation:  Patient will benefit from ongoing skilled OT services in outpatient setting to continue to advance functional skills in the area of iADL, School/Education, and Vocation.  Equipment: No equipment provided  Reasons for discharge: treatment goals met and discharge from hospital  Patient/family agrees with progress made and goals achieved: Yes  OT Discharge Precautions/Restrictions  Precautions Precautions: None Restrictions Weight Bearing Restrictions: No ADL ADL Eating: Supervision/safety Where Assessed-Eating: Edge of bed Grooming: Modified independent Where Assessed-Grooming: Standing at sink Upper Body Bathing: Modified independent Where Assessed-Upper Body Bathing: Shower Lower Body Bathing: Modified independent Where Assessed-Lower Body Bathing: Shower Upper Body Dressing: Modified independent (Device) Where Assessed-Upper Body Dressing: Sitting at sink Lower Body Dressing: Modified independent Where Assessed-Lower Body Dressing: Sitting at sink Toileting: Modified independent Where Assessed-Toileting: Glass blower/designer: Diplomatic Services operational officer Method: Human resources officer: Not assessed Social research officer, government: Modified independent Social research officer, government  Method: Ambulating Vision Baseline Vision/History: 0 No visual deficits Patient Visual Report: Diplopia;Blurring of vision Vision Assessment?: Yes Eye Alignment: Within Functional Limits Ocular Range of Motion: Restricted on the right Alignment/Gaze Preference: Within Defined Limits Tracking/Visual Pursuits: Right eye does not track laterally Convergence: Impaired (comment) (CN III impairment) Diplopia Assessment: Disappears with one eye closed (occasionally present) Perception  Perception: Within Functional Limits Praxis Praxis: Intact Cognition Overall Cognitive Status: Impaired/Different from baseline Arousal/Alertness: Awake/alert Orientation Level: Oriented X4 Year: 2022 Month: January Day of Week: Correct Attention: Selective Selective Attention: Appears intact Memory: Impaired Memory Impairment: Decreased recall of new information;Decreased short term memory Decreased Short Term Memory: Verbal complex;Functional complex Immediate Memory Recall:  (did not perform d/t unplanned d/c to acute) Awareness Impairment: Anticipatory impairment Safety/Judgment: Impaired (awareness of cognitive deficits impaired) Ranch[employer] Scales of Cognitive Functioning: Purposeful/appropriate Sensation Sensation Light Touch: Appears Intact Hot/Cold: Appears Intact Proprioception: Appears Intact Coordination Gross Motor Movements are Fluid and Coordinated: Yes Fine Motor Movements are Fluid and Coordinated: Yes Coordination and Movement Description: Still with slight posterior bias Motor  Motor Motor: Within Functional Limits Mobility  Bed Mobility Bed Mobility: Rolling Right;Rolling Left;Supine to Sit;Sit to Supine Rolling Right: Independent Rolling Left: Independent Supine to Sit: Independent Sit to Supine: Independent Transfers Sit to Stand: Independent Stand to Sit: Independent  Trunk/Postural Assessment  Cervical Assessment Cervical Assessment: Within Functional  Limits Thoracic Assessment Thoracic Assessment: Within Functional Limits Lumbar Assessment Lumbar Assessment: Within Functional Limits Postural Control Postural Control: Deficits on evaluation (delayed righting reactions)  Balance Balance Balance Assessed: Yes Static Sitting Balance Static Sitting - Balance Support: Feet supported;No upper extremity supported Static Sitting - Level of Assistance: 7: Independent Dynamic Sitting Balance Dynamic Sitting - Balance Support: Feet supported;No upper extremity supported Dynamic Sitting - Level of Assistance: 7: Independent Static Standing Balance Static Standing - Balance Support: No upper extremity supported Static Standing - Level of Assistance: 7: Independent Dynamic Standing Balance Dynamic Standing - Balance Support: No  upper extremity supported Dynamic Standing - Level of Assistance: 7: Independent Extremity/Trunk Assessment RUE Assessment RUE Assessment: Within Functional Limits LUE Assessment LUE Assessment: Within Functional Limits   Curtis Sites 07/15/2021, 3:42 PM

## 2021-07-16 ENCOUNTER — Encounter (HOSPITAL_COMMUNITY)
Admission: RE | Disposition: A | Discharge status: Home / Self Care | Payer: Self-pay | Source: Intra-hospital | Attending: Physical Medicine & Rehabilitation

## 2021-07-16 ENCOUNTER — Inpatient Hospital Stay (HOSPITAL_COMMUNITY): Payer: BC Managed Care – PPO | Admitting: Anesthesiology

## 2021-07-16 ENCOUNTER — Inpatient Hospital Stay (HOSPITAL_COMMUNITY)
Admission: EM | Admit: 2021-07-16 | Discharge: 2021-07-17 | DRG: 272 | Disposition: A | Discharge status: Home / Self Care | Payer: BC Managed Care – PPO | Source: Skilled Nursing Facility | Attending: Interventional Radiology | Admitting: Interventional Radiology

## 2021-07-16 ENCOUNTER — Inpatient Hospital Stay (HOSPITAL_COMMUNITY): Payer: BC Managed Care – PPO

## 2021-07-16 ENCOUNTER — Other Ambulatory Visit (HOSPITAL_COMMUNITY): Payer: Self-pay | Admitting: Physician Assistant

## 2021-07-16 ENCOUNTER — Inpatient Hospital Stay (HOSPITAL_COMMUNITY)
Admission: RE | Admit: 2021-07-16 | Payer: BC Managed Care – PPO | Source: Ambulatory Visit | Admitting: Interventional Radiology

## 2021-07-16 ENCOUNTER — Encounter (HOSPITAL_COMMUNITY): Payer: Self-pay | Admitting: Physical Medicine & Rehabilitation

## 2021-07-16 DIAGNOSIS — S069X6D Unspecified intracranial injury with loss of consciousness greater than 24 hours without return to pre-existing conscious level with patient surviving, subsequent encounter: Secondary | ICD-10-CM

## 2021-07-16 DIAGNOSIS — Z20822 Contact with and (suspected) exposure to covid-19: Secondary | ICD-10-CM | POA: Diagnosis present

## 2021-07-16 DIAGNOSIS — I7771 Dissection of carotid artery: Secondary | ICD-10-CM | POA: Diagnosis present

## 2021-07-16 DIAGNOSIS — I671 Cerebral aneurysm, nonruptured: Secondary | ICD-10-CM | POA: Diagnosis present

## 2021-07-16 HISTORY — PX: IR ANGIOGRAM FOLLOW UP STUDY: IMG697

## 2021-07-16 HISTORY — PX: RADIOLOGY WITH ANESTHESIA: SHX6223

## 2021-07-16 HISTORY — PX: IR TRANSCATH/EMBOLIZ: IMG695

## 2021-07-16 HISTORY — PX: IR CT HEAD LTD: IMG2386

## 2021-07-16 HISTORY — PX: IR 3D INDEPENDENT WKST: IMG2385

## 2021-07-16 LAB — MRSA NEXT GEN BY PCR, NASAL: MRSA by PCR Next Gen: NOT DETECTED

## 2021-07-16 LAB — POCT ACTIVATED CLOTTING TIME: Activated Clotting Time: 221 seconds

## 2021-07-16 LAB — HEPARIN LEVEL (UNFRACTIONATED): Heparin Unfractionated: <0.1 IU/mL — ABNORMAL LOW (ref 0.30–0.70)

## 2021-07-16 LAB — PROTIME-INR
INR: 1 (ref 0.8–1.2)
Prothrombin Time: 12.8 seconds (ref 11.4–15.2)

## 2021-07-16 LAB — RESP PANEL BY RT-PCR (FLU A&B, COVID) ARPGX2
Influenza A by PCR: NEGATIVE
Influenza B by PCR: NEGATIVE
SARS Coronavirus 2 by RT PCR: NEGATIVE

## 2021-07-16 LAB — PLATELET INHIBITION P2Y12

## 2021-07-16 IMAGING — XA IR TRANSCATH EMBOLIZATION
1 series · 9 of 24 positions shown · IV contrast (IODINE)
Comparison: CT angiogram of the head and neck [DATE].

CLINICAL DATA: Expanding large pseudoaneurysm right internal
carotid artery petrous cavernous junction with decompression into
the right cavernous sinus.

EXAM:
TRANSCATHETER THERAPY EMBOLIZATION
TECHNIQUE: Informed written consent was obtained from the patient after a
thorough discussion of the procedural risks, benefits and
alternatives. All questions were addressed. Maximal Sterile Barrier
Technique was utilized including caps, mask, sterile gowns, sterile
gloves, sterile drape, hand hygiene and skin antiseptic. A timeout
was performed prior to the initiation of the procedure.

[Series 300: dr. (person_name). · 9 of 260 slices shown]
[im 12/260]
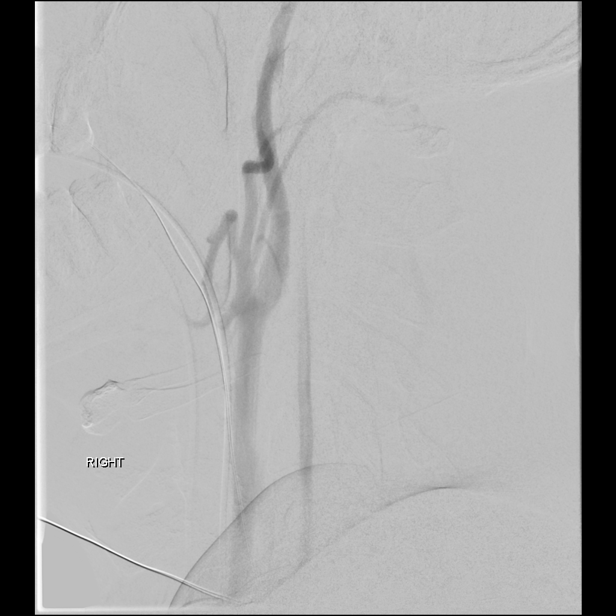
[im 46/260]
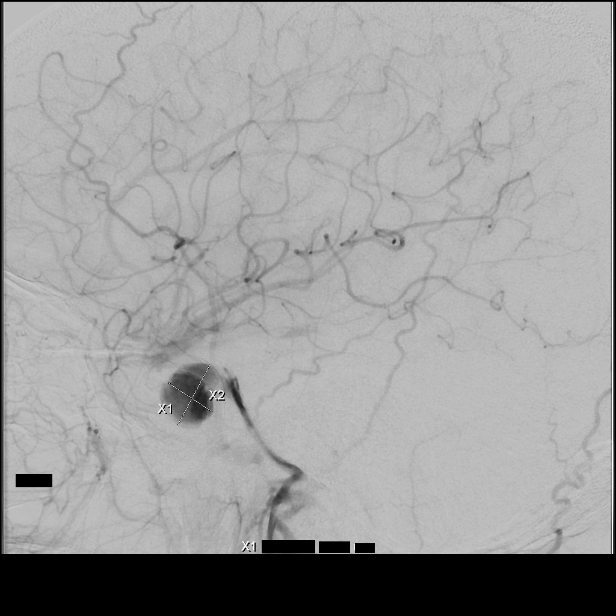
[im 68/260]
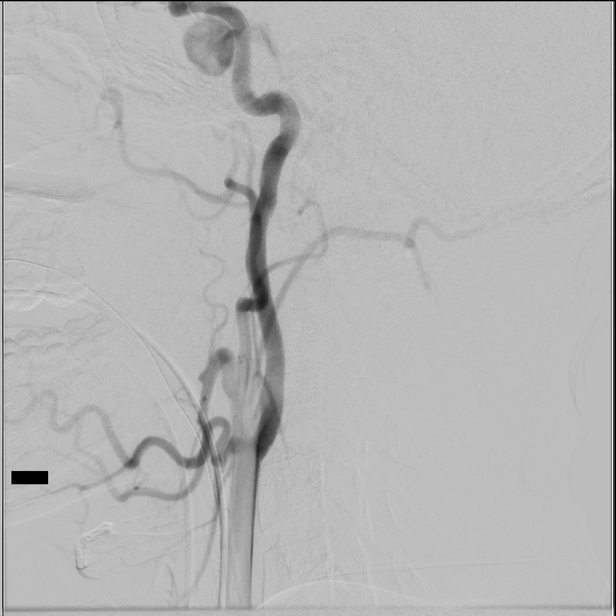
[im 102/260]
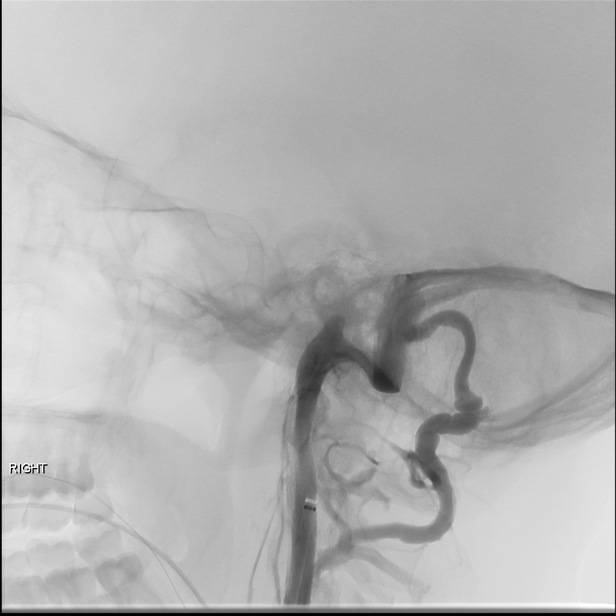
[im 136/260]
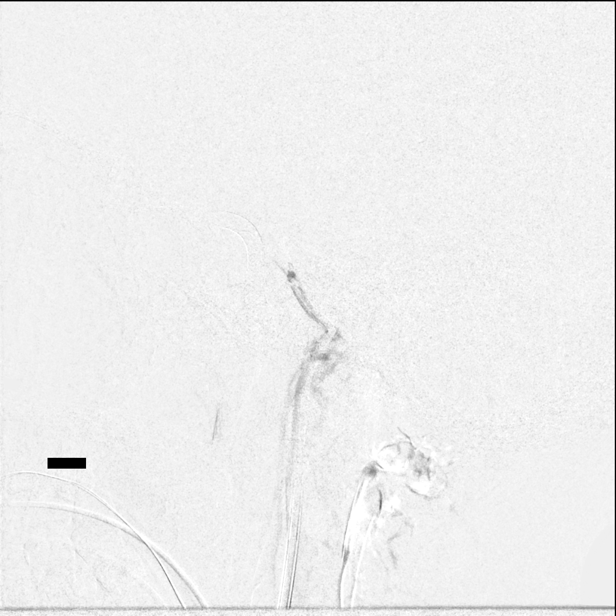
[im 158/260]
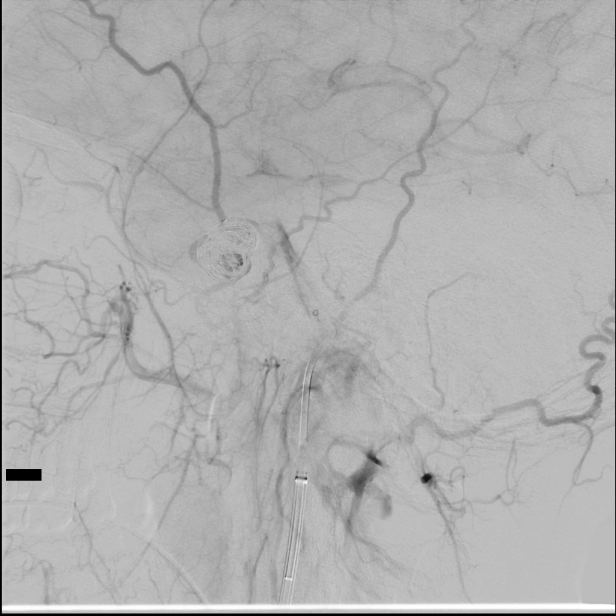
[im 192/260]
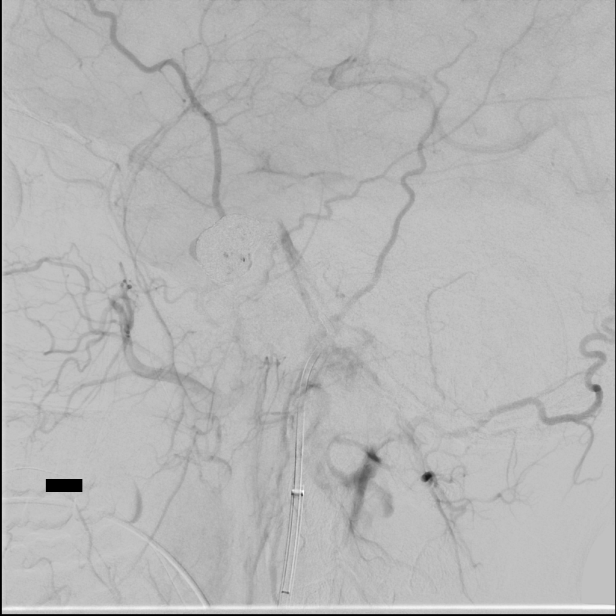
[im 226/260]
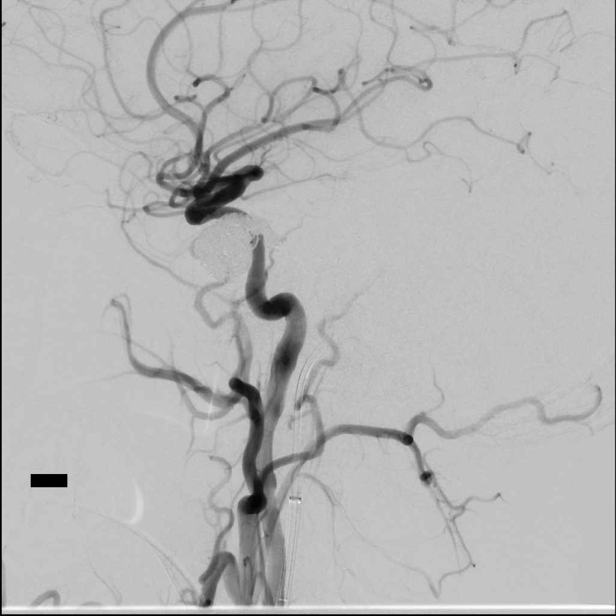
[im 248/260]
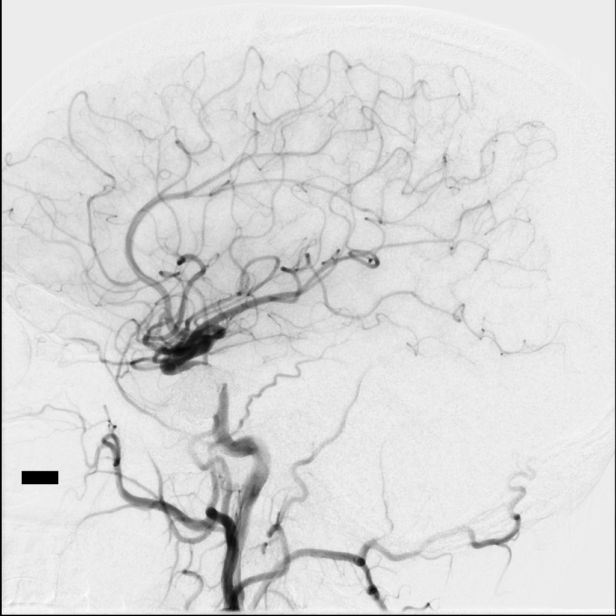

[9 of 24 positions shown; findings below may reference images not displayed]

MEDICATIONS:
Heparin 3,000 units IV; Ancef 2 g IV antibiotic was administered
within 1 hour of the procedure.

ANESTHESIA/SEDATION:
General anesthesia.

CONTRAST:  Omnipaque 300 approximately 160 mL.

FLUOROSCOPY TIME:  Fluoroscopy Time: 53 minutes 42 seconds ([IU]
mGy).

COMPLICATIONS:
None immediate.
The left groin was then prepped and draped in the usual sterile
fashion. Thereafter using modified Seldinger technique transfemoral
access into the left common femoral artery was obtained without
difficulty. Over a 0.035 inch guidewire, a 5 French JB 1 catheter
was advanced to the right common carotid artery. The guidewire was
removed. Good aspiration from the hub of the 5 French diagnostic
catheter in the right common carotid artery. Diagnostic catheter was
then perfor[REDACTED]ed extra cranially and intracranially.

The right groin was prepped and draped in the usual sterile fashion.
Thereafter using modified Seldinger technique, transfemoral access
into the right common femoral vein was obtained without difficulty.
Over a 0.035 inch guidewire, a 5 French Pinnacle sheath was
inserted. Through this, and also over 0.035 inch guidewire, a 5
French JB 1 catheter was advanced to abdominal inferior vena cava.
Over an 035 inch J guidewire, the JB 1 catheter was advanced without
difficulty to the inferior vena cava through into the right superior
vena cava and subsequently into the right internal jugular vein in
the mid cervical segment.

The guidewire was removed. Good aspiration obtained from the hub of
the 5 French catheter. Contrast injection demonstrated safe
positioning of the tip of the 5 French diagnostic catheter which was
then connected to continuous heparinized saline infusion.
FINDINGS: A right subclavian arteriogram demonstrates the right vertebral
artery origin to be widely patent.

The vessel is seen to opacify to the cranial skull base. The right
common carotid arteriogram demonstrates the right external carotid
artery and its major branches to be widely patent.

The right internal carotid artery at the bulb to the cranial skull
base is widely patent.

The petrous, the cavernous and the supraclinoid segments are widely
patent.

The previously positioned 3 pipeline flow diverter devices
straddling the petrous cavernous junction appear widely patent
without evidence of intra stenosis.

More distally, the right internal carotid artery caval and
supraclinoid segments demonstrate wide patency.

The right middle cerebral artery and the right anterior cerebral
artery opacify into the capillary and venous phases.

Demonstrated is the now enlarged almost bilobed pseudoaneurysm
arising from the petrous cavernous right ICA. Measurements are
approximately 16 mm x 13 mm. Also noted is brisk decompression into
the posterior aspect of the right cavernous sinus and drainage into
the right inferior petrosal sinus and subsequently into the right
internal jugular vein.

ENDOVASCULAR OBLITERATION OF ENLARGING RIGHT INTERNAL CAROTID ARTERY
PSEUDOANEURYSM VIA TRANSVENOUS APPROACH.

The diagnostic JB 1 catheter in the right common carotid artery was
then connected to continuous heparinized saline infusion for future
injections into the venous phase.

The diagnostic JB 1 catheter in the right internal jugular vein was
then exchanged over an 0.035 inch 300 cm Rosen exchange guidewire
for a 90 cm 8 French Neuron Max sheath. The Neuron Max sheath was
advanced under fluoroscopic guidance to the right internal carotid
artery just proximal to the skull base. The guidewire was removed.
Good aspiration obtained from the hub of the 8 French Neuron Max
sheath. The venogram obtained through Neuron Max sheath demonstrated
retrograde opacification of basilar venous plexus, and also the
right internal jugular bulb.

Over an 035 inch Roadrunner guidewire, a 125 cm 6 WHIT
catheter was then advanced to the distal end of the Neuron Max
sheath.

Using biplane roadmap technique under constant fluoroscopic
guidance, the 035 inch Roadrunner guidewire was advanced without
difficulty into the orifice of the right inferior petrosal sinus
followed by the WHIT support catheter.

The guidewire was removed. Good aspiration obtained from the hub of
the WHIT support catheter. Gentle control arteriogram performed
through the WHIT diagnostic catheter in the inferior petrosal
sinus demonstrated opacification of the pseudoaneurysm, and the
right cavernous sinus.

At this time over a 0.014 inch standard Synchro micro guidewire with
a moderate configuration, an 010 Echelon 2 tip microcatheter was
then advanced to the distal end of the WHIT support catheter.
Using a torque device, the micro guidewire was advanced into the
large bilobed pseudoaneurysm followed by the microcatheter, which
was advanced to form a loop configuration in the larger lobe of the
pseudo aneurysm.

The guidewire was removed. Good aspiration obtained from the hub of
the microcatheter. A gentle control arteriogram performed through
the microcatheter demonstrated safe positioning of tip of the
microcatheter which was then connected to continuous heparinized
saline infusion. Embolization was then ready to begin.

The first coil utilized was a 16 x 40 Axium coil. This was advanced
to distal end of the intra aneurysmal microcatheter. Thereafter, the
coil was delivered under fluoroscopic guidance. This was then
detached in the usual manner without any difficulty.

This was subsequently followed by placement of a 14 x 40 Axium
framing coil, a 12 x 40 Axium framing coil, a 10 x 20 Axium framing
coil, a 7 x 30 Axium framing coil, a 7 x 30 Axium framing coil, a 6
x 20 Bare helical coil, a 6 x 20 Bare helical coil, a 5 x 20 helix
Bare coil, a 5 x 20 helix Bare coil, a 4 x 10 Bare helix coil, a 5 x
15 Bare helix coil, a 3.5 x 8 helix 3D coil, a 3 x 8 Bare helix
coil, and finally a Bare helix 3 x 15 coil. Each coil was advanced
into the aneurysm under fluoroscopic guidance.

At least 3 control arteriograms were performed during this
embolization to ensure safe configuration of the coil mass. A final
control arteriogram performed through the 5 French diagnostic
catheter in the right common carotid artery demonstrated complete
angiographic obliteration of the large bilobed pseudoaneurysm, with
no flow noted into the inferior petrosal sinus.

Intracranially, the right internal carotid artery, and the right
middle and the right anterior cerebral territories remain widely
patent without evidence of intraluminal filling defects or of
occlusions.

The 8 French Neuron Max sheath was then retrieved and removed and
pressure held for hemostasis at the right groin puncture site.

The left common femoral arterial sheath was removed with hemostasis
achieved with a 5 French Exoseal closure device and manual
compression. Distal pulses remained palpable in both feet unchanged.

CT of the brain demonstrated no evidence of intracranial hemorrhage,
mass effect or of hydrocephalus.

Patient's general anesthesia was reversed. The patient was extubated
without any difficulty.

Upon recovery, the patient denied any headaches, nausea or vomiting.

He was alert enough to be oriented to place and year.

Patient was able to move all 4 extremities equally.

He was then transferred to the PACU and then neuro ICU.

Overnight, the patient had an approximately 7 to [DATE] what appeared
to be a generalized headache with nausea and vomiting x 2.

This responded to Zofran IV, and also Toradol IV 30 mg Q 6 hourly
p.r.n. for headache.

In the morning, patient was devoid of any headaches, nausea or
vomiting. Patient was able to handle oral solid food and liquids
without any difficulty.

Neurologically, the patient was alert, awake, oriented to time,
place, space.

He continued to have difficulty with diplopia on both left and right
horizontal gaze without nystagmus.

There was moderate improvement in the right eye ptosis.

Right pupil remained to approximately 4 mm and the left at 3 mm.

However, both reacted directly and consensually to light.

Patient was ambulated independently

He was then discharged in the care of his parents. Specific
instructions were those of maintaining adequate hydration, and to be
taking dual antiplatelets for at least 2 weeks. At that time, the
patient will see me in outpatient clinic. The decision will then
made regarding creasing the Brilinta dose to 45 mg b.i.d.

Patient was also advised to refrain from stooping, bending or
lifting more than 10 pounds for 2 weeks. For other questions or
concerns that they may have, the patient and the parents were
instructed to call.

Should the patient have any stroke-like symptoms, patient was
instructed to call [phone].
IMPRESSION: Status post endovascular complete obliteration of enlarging large
bilobed pseudoaneurysm of the right internal carotid artery petrous
cavernous junction, with primary coiling using the transvenous route
via the right inferior petrosal sinus.

PLAN:
Follow-up in the clinic approximately 2 weeks from discharge.

## 2021-07-16 SURGERY — RADIOLOGY WITH ANESTHESIA
Anesthesia: General

## 2021-07-16 MED ORDER — LIDOCAINE 2% (20 MG/ML) 5 ML SYRINGE
INTRAMUSCULAR | Status: DC | PRN
  Administered 2021-07-16: 80 mg via INTRAVENOUS

## 2021-07-16 MED ORDER — KETOROLAC TROMETHAMINE 30 MG/ML IJ SOLN
30.0000 mg | Freq: Four times a day (QID) | INTRAMUSCULAR | Status: DC | PRN
  Administered 2021-07-16: 30 mg via INTRAVENOUS
  Filled 2021-07-16: qty 1

## 2021-07-16 MED ORDER — TICAGRELOR 90 MG PO TABS
90.0000 mg | ORAL_TABLET | Freq: Two times a day (BID) | ORAL | Status: DC
  Administered 2021-07-16 – 2021-07-17 (×2): 90 mg via ORAL
  Filled 2021-07-16 (×2): qty 1

## 2021-07-16 MED ORDER — HEPARIN (PORCINE) 25000 UT/250ML-% IV SOLN: 500.0000 [IU]/h | INTRAVENOUS | Status: DC

## 2021-07-16 MED ORDER — GLYCOPYRROLATE PF 0.2 MG/ML IJ SOSY
PREFILLED_SYRINGE | INTRAMUSCULAR | Status: DC | PRN
  Administered 2021-07-16: .2 mg via INTRAVENOUS

## 2021-07-16 MED ORDER — ACETAMINOPHEN 325 MG PO TABS: 650.0000 mg | ORAL_TABLET | ORAL | Status: DC | PRN

## 2021-07-16 MED ORDER — EPHEDRINE SULFATE-NACL 50-0.9 MG/10ML-% IV SOSY
PREFILLED_SYRINGE | INTRAVENOUS | Status: DC | PRN
Start: 2021-07-16 — End: 2021-07-16
  Administered 2021-07-16: 5 mg via INTRAVENOUS

## 2021-07-16 MED ORDER — CLEVIDIPINE BUTYRATE 0.5 MG/ML IV EMUL
INTRAVENOUS | Status: AC
  Filled 2021-07-16: qty 50

## 2021-07-16 MED ORDER — ROCURONIUM BROMIDE 10 MG/ML (PF) SYRINGE
PREFILLED_SYRINGE | INTRAVENOUS | Status: DC | PRN
  Administered 2021-07-16: 20 mg via INTRAVENOUS
  Administered 2021-07-16: 30 mg via INTRAVENOUS
  Administered 2021-07-16: 20 mg via INTRAVENOUS
  Administered 2021-07-16: 50 mg via INTRAVENOUS

## 2021-07-16 MED ORDER — CLEVIDIPINE BUTYRATE 0.5 MG/ML IV EMUL
INTRAVENOUS | Status: DC | PRN
  Administered 2021-07-16: 1 mg/h via INTRAVENOUS

## 2021-07-16 MED ORDER — NITROGLYCERIN 1 MG/10 ML FOR IR/CATH LAB
INTRA_ARTERIAL | Status: AC
  Filled 2021-07-16: qty 10

## 2021-07-16 MED ORDER — FENTANYL CITRATE (PF) 100 MCG/2ML IJ SOLN
INTRAMUSCULAR | Status: DC | PRN
Start: 2021-07-16 — End: 2021-07-16
  Administered 2021-07-16 (×3): 50 ug via INTRAVENOUS

## 2021-07-16 MED ORDER — ACETAMINOPHEN 160 MG/5ML PO SOLN: 325.0000 mg | ORAL | Status: AC | PRN

## 2021-07-16 MED ORDER — SODIUM CHLORIDE 0.9 % IV SOLN: INTRAVENOUS | Status: DC

## 2021-07-16 MED ORDER — OXYCODONE HCL 5 MG PO TABS: 5.0000 mg | ORAL_TABLET | Freq: Once | ORAL | Status: AC | PRN

## 2021-07-16 MED ORDER — TICAGRELOR 90 MG PO TABS: 90.0000 mg | ORAL_TABLET | Freq: Two times a day (BID) | ORAL | Status: DC

## 2021-07-16 MED ORDER — ACETAMINOPHEN 650 MG RE SUPP: 650.0000 mg | RECTAL | Status: DC | PRN

## 2021-07-16 MED ORDER — PHENYLEPHRINE 40 MCG/ML (10ML) SYRINGE FOR IV PUSH (FOR BLOOD PRESSURE SUPPORT)
PREFILLED_SYRINGE | INTRAVENOUS | Status: DC | PRN
  Administered 2021-07-16: 80 ug via INTRAVENOUS
  Administered 2021-07-16: 40 ug via INTRAVENOUS
  Administered 2021-07-16: 80 ug via INTRAVENOUS

## 2021-07-16 MED ORDER — ASPIRIN 81 MG PO CHEW: 81.0000 mg | CHEWABLE_TABLET | Freq: Every day | ORAL | Status: DC

## 2021-07-16 MED ORDER — HEPARIN SODIUM (PORCINE) 1000 UNIT/ML IJ SOLN
INTRAMUSCULAR | Status: DC | PRN
  Administered 2021-07-16: 2000 [IU] via INTRAVENOUS
  Administered 2021-07-16: 1000 [IU] via INTRAVENOUS

## 2021-07-16 MED ORDER — PROPOFOL 10 MG/ML IV BOLUS
INTRAVENOUS | Status: DC | PRN
  Administered 2021-07-16: 30 mg via INTRAVENOUS
  Administered 2021-07-16: 20 mg via INTRAVENOUS
  Administered 2021-07-16: 150 mg via INTRAVENOUS

## 2021-07-16 MED ORDER — LACTATED RINGERS IV SOLN: INTRAVENOUS | Status: DC | PRN

## 2021-07-16 MED ORDER — CEFAZOLIN SODIUM-DEXTROSE 2-3 GM-%(50ML) IV SOLR
INTRAVENOUS | Status: DC | PRN
Start: 2021-07-16 — End: 2021-07-16
  Administered 2021-07-16: 2 g via INTRAVENOUS

## 2021-07-16 MED ORDER — FENTANYL CITRATE PF 50 MCG/ML IJ SOSY
25.0000 ug | PREFILLED_SYRINGE | INTRAMUSCULAR | Status: DC | PRN
  Administered 2021-07-16 (×2): 50 ug via INTRAVENOUS

## 2021-07-16 MED ORDER — PHENYLEPHRINE HCL-NACL 20-0.9 MG/250ML-% IV SOLN
INTRAVENOUS | Status: DC | PRN
  Administered 2021-07-16: 25 ug/min via INTRAVENOUS

## 2021-07-16 MED ORDER — DEXAMETHASONE SODIUM PHOSPHATE 10 MG/ML IJ SOLN
INTRAMUSCULAR | Status: DC | PRN
Start: 2021-07-16 — End: 2021-07-16
  Administered 2021-07-16: 10 mg via INTRAVENOUS

## 2021-07-16 MED ORDER — ONDANSETRON HCL 4 MG/2ML IJ SOLN
INTRAMUSCULAR | Status: DC | PRN
  Administered 2021-07-16: 4 mg via INTRAVENOUS

## 2021-07-16 MED ORDER — ACETAMINOPHEN 160 MG/5ML PO SOLN: 325.0000 mg | ORAL | Status: DC | PRN

## 2021-07-16 MED ORDER — CLEVIDIPINE BUTYRATE 0.5 MG/ML IV EMUL
0.0000 mg/h | INTRAVENOUS | Status: DC
  Administered 2021-07-16: 1 mg/h via INTRAVENOUS
  Filled 2021-07-16: qty 50

## 2021-07-16 MED ORDER — ACETAMINOPHEN 325 MG PO TABS: 325.0000 mg | ORAL_TABLET | ORAL | Status: AC | PRN

## 2021-07-16 MED ORDER — CLEVIDIPINE BUTYRATE 0.5 MG/ML IV EMUL: 0.0000 mg/h | INTRAVENOUS | Status: DC

## 2021-07-16 MED ORDER — ACETAMINOPHEN 325 MG PO TABS
325.0000 mg | ORAL_TABLET | ORAL | Status: DC | PRN
  Administered 2021-07-16: 650 mg via ORAL

## 2021-07-16 MED ORDER — ESMOLOL HCL 100 MG/10ML IV SOLN
INTRAVENOUS | Status: DC | PRN
  Administered 2021-07-16 (×2): 30 mg via INTRAVENOUS

## 2021-07-16 MED ORDER — ONDANSETRON HCL 4 MG/2ML IJ SOLN: 4.0000 mg | Freq: Four times a day (QID) | INTRAMUSCULAR | Status: DC | PRN

## 2021-07-16 MED ORDER — CHLORHEXIDINE GLUCONATE 0.12 % MT SOLN
OROMUCOSAL | Status: AC
  Filled 2021-07-16: qty 15

## 2021-07-16 MED ORDER — ACETAMINOPHEN 160 MG/5ML PO SOLN: 650.0000 mg | ORAL | Status: DC | PRN

## 2021-07-16 MED ORDER — CEFAZOLIN SODIUM-DEXTROSE 2-4 GM/100ML-% IV SOLN
INTRAVENOUS | Status: AC
  Filled 2021-07-16: qty 100

## 2021-07-16 MED ORDER — ASPIRIN 81 MG PO CHEW
81.0000 mg | CHEWABLE_TABLET | Freq: Every day | ORAL | Status: DC
  Administered 2021-07-17: 81 mg via ORAL
  Filled 2021-07-16: qty 1

## 2021-07-16 MED ORDER — MEPERIDINE HCL 25 MG/ML IJ SOLN: 6.2500 mg | INTRAMUSCULAR | Status: AC | PRN

## 2021-07-16 MED ORDER — LACTATED RINGERS IV SOLN: INTRAVENOUS | Status: DC

## 2021-07-16 MED ORDER — IOHEXOL 300 MG/ML  SOLN
100.0000 mL | Freq: Once | INTRAMUSCULAR | Status: AC | PRN
  Administered 2021-07-16: 80 mL via INTRA_ARTERIAL

## 2021-07-16 MED ORDER — CHLORHEXIDINE GLUCONATE CLOTH 2 % EX PADS
6.0000 | MEDICATED_PAD | Freq: Every day | CUTANEOUS | Status: DC
  Administered 2021-07-16 – 2021-07-17 (×2): 6 via TOPICAL

## 2021-07-16 MED ORDER — SUGAMMADEX SODIUM 200 MG/2ML IV SOLN
INTRAVENOUS | Status: DC | PRN
  Administered 2021-07-16: 200 mg via INTRAVENOUS

## 2021-07-16 MED ORDER — FENTANYL CITRATE PF 50 MCG/ML IJ SOSY: 25.0000 ug | PREFILLED_SYRINGE | INTRAMUSCULAR | Status: AC | PRN

## 2021-07-16 MED ORDER — OXYCODONE HCL 5 MG/5ML PO SOLN: 5.0000 mg | Freq: Once | ORAL | Status: AC | PRN

## 2021-07-16 MED ORDER — ONDANSETRON HCL 4 MG/2ML IJ SOLN
4.0000 mg | Freq: Once | INTRAMUSCULAR | Status: AC | PRN
  Administered 2021-07-16: 4 mg via INTRAVENOUS

## 2021-07-16 MED ORDER — HEPARIN (PORCINE) 25000 UT/250ML-% IV SOLN
850.0000 [IU]/h | INTRAVENOUS | Status: AC
  Administered 2021-07-16: 500 [IU]/h via INTRAVENOUS

## 2021-07-16 MED ORDER — HEPARIN (PORCINE) 25000 UT/250ML-% IV SOLN
INTRAVENOUS | Status: AC
  Filled 2021-07-16: qty 250

## 2021-07-16 MED ORDER — ONDANSETRON HCL 4 MG/2ML IJ SOLN: 4.0000 mg | Freq: Once | INTRAMUSCULAR | Status: DC | PRN

## 2021-07-16 NOTE — Progress Notes (Signed)
Speech Language Pathology Discharge Summary  Patient Details  Name: Jason [name] MRN: [id number] Date of Birth: 2001-05-12  Patient has met 7 of 7 long term goals.  Patient to discharge at overall Modified Independent level.  Reasons goals not met: All goals were achieved   Clinical Impression/Discharge Summary: Patient has made excellent gains and has met 7 of 7 long-term goals this admission. Patient emerged into [place] and is overall modified independent for cognitive tasks in regards to attention, complex problem solving, emergent awareness, functional recall, and executive functions. Patient is currently tolerating regular diet and thin liquids and requires implements safe swallow precautions and strategies including with modified independence. Patient and family education is complete and patient to discharge at overall mod I level. Patient's care partner is independent to provide the necessary physical and cognitive assistance at discharge. Patient would benefit from continued SLP services in outpatient setting to maximize cognitive function and functional independence.    Care Partner:  Caregiver Able to Provide Assistance: Yes  Type of Caregiver Assistance: Cognitive;Physical  Recommendation:  Outpatient SLP;24 hour supervision/assistance  Rationale for SLP Follow Up: Maximize cognitive function and independence   Equipment: None   Reasons for discharge: Treatment goals met (Patient discharged for surgery)   Patient/Family Agrees with Progress Made and Goals Achieved: Yes    [name] T [name] 07/16/2021, 4:14 PM

## 2021-07-16 NOTE — Anesthesia Postprocedure Evaluation (Signed)
Anesthesia Post Note  Patient: Jason [name]  Procedure(s) Performed: RADIOLOGY WITH ANESTHESIA ,EMBOLIZATION     Patient location during evaluation: PACU Anesthesia Type: General Level of consciousness: awake and alert Pain management: pain level controlled Vital Signs Assessment: post-procedure vital signs reviewed and stable Respiratory status: spontaneous breathing, nonlabored ventilation, respiratory function stable and patient connected to nasal cannula oxygen Cardiovascular status: blood pressure returned to baseline and stable Postop Assessment: no apparent nausea or vomiting Anesthetic complications: no   No notable events documented.  Last Vitals:  Vitals:   07/16/21 1140 07/16/21 1155  BP: (!) 104/58 110/61  Pulse: 64 63  Resp: 13 13  Temp: 36.8 C   SpO2: 100% 99%    Last Pain:  Vitals:   07/16/21 1140  TempSrc:   PainSc: 0-No pain                 Jason [name]

## 2021-07-16 NOTE — Progress Notes (Signed)
Pt seen in PACU with Dr. Corliss Skains, awaiting bed placement  BP 102/60 (BP Location: Right Arm)    Pulse 65    Resp 11    SpO2 99%   Awake and alert, following all commands. No change in facial droop Pupils Rt 76mm, Lt 30mm, both reactive Moves all 4 extremities on commands No drift Fine motor intact Strength equal. Ext: Rt groin(venous stick) soft, NT, no hematoma        Lt groin (arterial stick) soft, NT, no hematoma Feet warm.  S/P coil obliteration of enlarging RT ICA paraclinoid pseudoaneurysm via RT inferior petrosal sinus venous approach.  Continue heparin gtt overnight. Reassess in am.  Brayton El PA-C Interventional Radiology 07/16/2021 3:09 PM

## 2021-07-16 NOTE — Sedation Documentation (Signed)
Left groin site arterial, sheath pulled and closed with a 5 fr exoseal at 1103. Dressed with gauze and tegaderm.   Right groin site venous, sheath pulled at 1105. Dressed with gauze and tegaderm.  Pulses intact, dressing clean/dry/intact.

## 2021-07-16 NOTE — H&P (Deleted)
  The note originally documented on this encounter has been moved the the encounter in which it belongs.  

## 2021-07-16 NOTE — Anesthesia Procedure Notes (Signed)
Arterial Line Insertion Start/End1/10/2021 8:05 AM, 07/16/2021 8:15 AM Performed by: Harden Mo, CRNA, CRNA  Patient location: Pre-op. Preanesthetic checklist: patient identified, IV checked, site marked, risks and benefits discussed, surgical consent, monitors and equipment checked, pre-op evaluation and anesthesia consent Lidocaine 1% used for infiltration Left, radial was placed Catheter size: 20 G Hand hygiene performed  and maximum sterile barriers used   Attempts: 1 Procedure performed without using ultrasound guided technique. Ultrasound Notes:anatomy identified, needle tip was noted to be adjacent to the nerve/plexus identified and no ultrasound evidence of intravascular and/or intraneural injection Following insertion, dressing applied and Biopatch. Post procedure assessment: normal and unchanged  Patient tolerated the procedure well with no immediate complications.

## 2021-07-16 NOTE — Anesthesia Procedure Notes (Signed)
Procedure Name: Intubation Date/Time: 07/16/2021 8:47 AM Performed by: Harden Mo, CRNA Pre-anesthesia Checklist: Patient identified, Emergency Drugs available, Suction available and Patient being monitored Patient Re-evaluated:Patient Re-evaluated prior to induction Oxygen Delivery Method: Circle System Utilized Preoxygenation: Pre-oxygenation with 100% oxygen Induction Type: IV induction Ventilation: Mask ventilation without difficulty Laryngoscope Size: Miller and 2 Grade View: Grade I Tube type: Oral Tube size: 7.5 mm Number of attempts: 1 Airway Equipment and Method: Stylet and Oral airway Placement Confirmation: ETT inserted through vocal cords under direct vision, positive ETCO2 and breath sounds checked- equal and bilateral Secured at: 23 cm Tube secured with: Tape Dental Injury: Teeth and Oropharynx as per pre-operative assessment

## 2021-07-16 NOTE — Transfer of Care (Signed)
Immediate Anesthesia Transfer of Care Note  Patient: Jason [name]  Procedure(s) Performed: RADIOLOGY WITH ANESTHESIA ,EMBOLIZATION  Patient Location: PACU  Anesthesia Type:General  Level of Consciousness: awake, alert  and oriented  Airway & Oxygen Therapy: Patient Spontanous Breathing  Post-op Assessment: Report given to RN, Post -op Vital signs reviewed and stable and Patient moving all extremities X 4  Post vital signs: Reviewed and stable  Last Vitals:  Vitals Value Taken Time  BP 138/55(74)   Temp    Pulse 70   Resp 15   SpO2 100     Last Pain:  Vitals:   07/15/21 2147  TempSrc:   PainSc: 0-No pain         Complications: No notable events documented.

## 2021-07-16 NOTE — H&P (Signed)
HPI:  The patient has had a H&P performed within the last 30 days, all history, medications, and exam have been reviewed. The patient denies any interval changes since the H&P.  Patient seen at bedside this morning, mom and dad present. Reviewed procedure and risks, they remain agreeable to proceed.  Medications: Prior to Admission medications   Medication Sig Start Date End Date Taking? Authorizing Provider  benzonatate (TESSALON) 100 MG capsule Take 1 capsule (100 mg total) by mouth every 8 (eight) hours. 05/15/21   Muthersbaugh, Dahlia Client, PA-C  lidocaine (XYLOCAINE) 2 % solution Use as directed 15 mLs in the mouth or throat every 4 (four) hours as needed for mouth pain. 04/24/21   White, Elita Boone, NP  ondansetron (ZOFRAN) 4 MG tablet Take 1 tablet (4 mg total) by mouth every 8 (eight) hours as needed for nausea or vomiting. 05/15/21   Muthersbaugh, Dahlia Client, PA-C  tiZANidine (ZANAFLEX) 4 MG capsule Take 1 capsule (4 mg total) by mouth at bedtime as needed for muscle spasms. 03/03/21   Raspet, Noberto Retort, PA-C  valACYclovir (VALTREX) 1000 MG tablet Take 1 tablet (1,000 mg total) by mouth 3 (three) times daily. 04/24/21   White, Elita Boone, NP     Vital Signs: BP (!) 101/50 (BP Location: Right Arm)    Pulse 70    Temp 98.2 F (36.8 C)    Resp 16    Ht 5\' 10"  (1.778 m)    Wt 166 lb 0.1 oz (75.3 kg)    SpO2 99%    BMI 23.82 kg/m   Physical Exam Vitals and nursing note reviewed.  Constitutional:      General: He is not in acute distress. HENT:     Head: Normocephalic.     Mouth/Throat:     Mouth: Mucous membranes are dry.     Pharynx: Oropharynx is clear. No oropharyngeal exudate.  Eyes:     Comments: (+) right eye patch  Cardiovascular:     Rate and Rhythm: Normal rate and regular rhythm.  Pulmonary:     Effort: Pulmonary effort is normal.     Breath sounds: Normal breath sounds.  Abdominal:     Palpations: Abdomen is soft.  Skin:    General: Skin is warm and dry.  Neurological:      Mental Status: He is alert. Mental status is at baseline.  Psychiatric:        Mood and Affect: Mood normal.        Behavior: Behavior normal.        Thought Content: Thought content normal.    Mallampati Score:  MD Evaluation Airway: WNL Heart: WNL Abdomen: WNL Chest/ Lungs: WNL ASA  Classification: Per MD or Designee Mallampati/Airway Score: One  Labs:  CBC: Recent Labs    06/28/21 0501 06/30/21 0611 07/07/21 0624 07/14/21 0512  WBC 15.2* 13.6* 9.7 7.5  HGB 12.4* 12.2* 11.3* 10.5*  HCT 39.4 37.9* 34.0* 32.4*  PLT 553* 445* 336 192    COAGS: Recent Labs    06/08/21 0245 07/16/21 0523  INR 1.0 1.0    BMP: Recent Labs    07/04/21 0521 07/07/21 0624 07/10/21 0530 07/14/21 0512  NA 134* 136 136 137  K 4.1 4.1 4.0 4.2  CL 102 99 105 107  CO2 23 26 23 25   GLUCOSE 111* 103* 109* 101*  BUN 27* 26* 19 13  CALCIUM 9.4 9.3 9.3 9.2  CREATININE 0.69 0.77 0.77 0.91  GFRNONAA >60 >60 >60 >60  LIVER FUNCTION TESTS: Recent Labs    08/26/20 0111 06/08/21 0245 06/28/21 0501  BILITOT 0.8 0.6 0.7  AST 22 175* 53*  ALT 15 122* 58*  ALKPHOS 92 72 159*  PROT 7.9 7.3 8.9*  ALBUMIN 4.4 3.9 3.7    Assessment/Plan:    21 y/o M with 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   Plan for cerebral angiogram with coil packing in the cavernous sinus and possible additional stent placement with general anesthesia today with Dr. Corliss Skains.   Risks and benefits of cerebral arteriogram with intervention were discussed with the patient including, but not limited to bleeding, infection, vascular injury, contrast induced renal failure, stroke, reperfusion hemorrhage, or even death.  This interventional procedure  involves the use of X-rays and because of the nature of the planned procedure, it is possible that we will have prolonged use of X-ray fluoroscopy. Potential radiation risks to you include (but are not limited to) the following: - A slightly elevated risk for cancer  several years later in life. This risk is typically less than 0.5% percent. This risk is low in comparison to the normal incidence of human cancer, which is 33% for women and 50% for men according to the [employer]. - Radiation induced injury can include skin redness, resembling a rash, tissue breakdown / ulcers and hair loss (which can be temporary or permanent).  The likelihood of either of these occurring depends on the difficulty of the procedure and whether you are sensitive to radiation due to previous procedures, disease, or genetic conditions.  IF your procedure requires a prolonged use of radiation, you will be notified and given written instructions for further action.  It is your responsibility to monitor the irradiated area for the 2 weeks following the procedure and to notify your physician if you are concerned that you have suffered a radiation induced injury.    All of the patient's questions were answered, patient is agreeable to proceed.  Consent signed and in chart.  Signed: Villa Herb 07/16/2021, 7:29 AM
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
## 2021-07-16 NOTE — Progress Notes (Addendum)
Inpatient Rehabilitation Discharge Medication Review by a Pharmacist  A complete drug regimen review was completed for this patient to identify any potential clinically significant medication issues.  High Risk Drug Classes Is patient taking? Indication by Medication  Antipsychotic Yes    Anticoagulant No   Antibiotic No    Opioid Yes PRN oxycodone pain  Antiplatelet Yes ASA/brinlinta - ICA with pseudoaneurysm   Hypoglycemics/insulin No    Vasoactive Medication Yes   Chemotherapy No    Other Yes      Type of Medication Issue Identified Description of Issue Recommendation(s)  Drug Interaction(s) (clinically significant)     Duplicate Therapy     Allergy     No Medication Administration End Date     Incorrect Dose     Additional Drug Therapy Needed     Significant med changes from prior encounter (inform family/care partners about these prior to discharge).    Other       Clinically significant medication issues were identified that warrant physician communication and completion of prescribed/recommended actions by midnight of the next day:  No   Pharmacist comments: Discharged to acute side for IR procedure  Time spent performing this drug regimen review (minutes):  20 min   Elwin Sleight 07/16/2021 12:51 PM
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
## 2021-07-16 NOTE — Progress Notes (Addendum)
Called MD Deveshwar to inform of pt complaining of headache and 1x episode emesis. Neuro exam stable otherwise.  Verbal order obtained for toradol and zofran. Clarified SBP parameters 120-140 by arterial line. Notified of small shadow of blood to pt's right venous groin puncture site. Pulses intact. Instructed to keep pt on clear liquid diet overnight. Clarified desired maintenance fluids are normal saline at 30ml/hr. Updated patient and family at bedside of new orders. MD Deveshwar aware in delay of starting cleviprex gtt to meet SBP 120-140 parameter goal due to delay in verifying medication with pharmacy.

## 2021-07-16 NOTE — Progress Notes (Signed)
IV consult placed for 3rd PIV due to reported IV medication incompatibility. Assessed MAR and did not note any IV medications that were incompatible. Advised RN that if 3 PIV's are needed then provider should consider a PICC or CVC. Instructed to place new consult if new needs arise.   Jamarious Febo Loyola Mast, RN

## 2021-07-16 NOTE — Plan of Care (Signed)
Problem: RH Swallowing Goal: LTG Patient will consume least restrictive diet using compensatory strategies with assistance (SLP) Description: LTG:  Patient will consume least restrictive diet using compensatory strategies with assistance (SLP) Outcome: Completed/Met Goal: LTG Pt will demonstrate functional change in swallow as evidenced by bedside/clinical objective assessment (SLP) Description: LTG: Patient will demonstrate functional change in swallow as evidenced by bedside/clinical objective assessment (SLP) Outcome: Completed/Met   Problem: RH Attention Goal: LTG Patient will demonstrate this level of attention during functional activites (SLP) Description: LTG:  Patient will will demonstrate this level of attention during functional activites (SLP) Outcome: Completed/Met   Problem: RH Cognition - SLP Goal: RH LTG Patient will demonstrate orientation with cues Description:  LTG:  Patient will demonstrate orientation to person/place/time/situation with cues (SLP)   Outcome: Completed/Met   Problem: RH Problem Solving Goal: LTG Patient will demonstrate problem solving for (SLP) Description: LTG:  Patient will demonstrate problem solving for basic/complex daily situations with cues  (SLP) Outcome: Completed/Met   Problem: RH Memory Goal: LTG Patient will use memory compensatory aids to (SLP) Description: LTG:  Patient will use memory compensatory aids to recall biographical/new, daily complex information with cues (SLP) Outcome: Completed/Met   Problem: RH Awareness Goal: LTG: Patient will demonstrate awareness during functional activites type of (SLP) Description: LTG: Patient will demonstrate awareness during functional activites type of (SLP) Outcome: Completed/Met

## 2021-07-16 NOTE — Procedures (Addendum)
INR . RT common carotid  arteriograms and and RT Internal jugular venograms. RT CFV and Lt CFA approaches.  S/P  coil obliteration of enlarging RT ICA paraclinoid pseudoaneurysm via RT inferior petrosal  sinus  venous approach. Post CT no ICH . Hemostasis with manual compression at RT CFV site and with 31F exoseal at Lt CFA site. Distal pulses present bilaterally. Extubated. Denies ant H/As ,N/V . No change in the Rt facial droop. Pupils Rt 50mm Lt #mm both reactive Dand C.  Moves all 4s equally spontaneously and to command. S.Marieliz Strang MD

## 2021-07-16 NOTE — Progress Notes (Signed)
Inpatient Rehabilitation Care Coordinator Discharge Note   Patient Details  Name: Jason [name] MRN: [id number] Date of Birth: Aug 07, 2000   Discharge location: D/c to home with his parents who will provide 24/7 care  Length of Stay: 18 days  Discharge activity level: Supervision  Home/community participation: Limited  Patient response GQ:QPYPPJ Literacy - How often do you need to have someone help you when you read instructions, pamphlets, or other written material from your doctor or pharmacy?: Never  Patient response KD:TOIZTI Isolation - How often do you feel lonely or isolated from those around you?: Patient unable to respond  Services provided included: MD, RD, PT, OT, SLP, RN, CM, TR, Pharmacy, Neuropsych, SW  Financial Services:  Field seismologist Utilized: [employer]  Choices offered to/list presented to: Yes  Follow-up services arranged:  Outpatient    Outpatient Servicies: [facility] for outpatient PT/OT/SLP and neuropsych referral submitted.      Patient response to transportation need: Is the patient able to respond to transportation needs?: Yes In the past 12 months, has lack of transportation kept you from medical appointments or from getting medications?: No In the past 12 months, has lack of transportation kept you from meetings, work, or from getting things needed for daily living?: No   Comments (or additional information):  Patient/Family verbalized understanding of follow-up arrangements:  Yes  Individual responsible for coordination of the follow-up plan: contact pt parents:Nicole# [phone] or Loraine Leriche #[phone]  Confirmed correct DME delivered: Gretchen Short 07/16/2021    Gretchen Short
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
## 2021-07-17 ENCOUNTER — Encounter (HOSPITAL_COMMUNITY): Payer: Self-pay | Admitting: Interventional Radiology

## 2021-07-17 DIAGNOSIS — I7771 Dissection of carotid artery: Secondary | ICD-10-CM | POA: Diagnosis present

## 2021-07-17 LAB — BASIC METABOLIC PANEL
Anion gap: 6 (ref 5–15)
BUN: 8 mg/dL (ref 6–20)
CO2: 22 mmol/L (ref 22–32)
Calcium: 8.2 mg/dL — ABNORMAL LOW (ref 8.9–10.3)
Chloride: 108 mmol/L (ref 98–111)
Creatinine, Ser: 0.78 mg/dL (ref 0.61–1.24)
GFR, Estimated: >60 mL/min (ref >60)
Glucose, Bld: 91 mg/dL (ref 70–99)
Potassium: 3.5 mmol/L (ref 3.5–5.1)
Sodium: 136 mmol/L (ref 135–145)

## 2021-07-17 LAB — CBC WITH DIFFERENTIAL/PLATELET
Abs Immature Granulocytes: 0.02 10*3/uL (ref 0.00–0.07)
Basophils Absolute: 0 10*3/uL (ref 0.0–0.1)
Basophils Relative: 0 %
Eosinophils Absolute: 0.1 10*3/uL (ref 0.0–0.5)
Eosinophils Relative: 1 %
HCT: 29.7 % — ABNORMAL LOW (ref 39.0–52.0)
Hemoglobin: 9.6 g/dL — ABNORMAL LOW (ref 13.0–17.0)
Immature Granulocytes: 0 %
Lymphocytes Relative: 32 %
Lymphs Abs: 2.7 10*3/uL (ref 0.7–4.0)
MCH: 29.7 pg (ref 26.0–34.0)
MCHC: 32.3 g/dL (ref 30.0–36.0)
MCV: 92 fL (ref 80.0–100.0)
Monocytes Absolute: 0.7 10*3/uL (ref 0.1–1.0)
Monocytes Relative: 8 %
Neutro Abs: 5 10*3/uL (ref 1.7–7.7)
Neutrophils Relative %: 59 %
Platelets: 165 10*3/uL (ref 150–400)
RBC: 3.23 MIL/uL — ABNORMAL LOW (ref 4.22–5.81)
RDW: 14.6 % (ref 11.5–15.5)
WBC: 8.5 10*3/uL (ref 4.0–10.5)
nRBC: 0 % (ref 0.0–0.2)

## 2021-07-17 MED ORDER — ASPIRIN 81 MG PO CHEW: 81.0000 mg | CHEWABLE_TABLET | Freq: Every day | ORAL | Status: AC

## 2021-07-17 MED ORDER — TICAGRELOR 90 MG PO TABS: 90.0000 mg | ORAL_TABLET | Freq: Two times a day (BID) | ORAL | 0 refills | Status: DC

## 2021-07-17 NOTE — Discharge Summary (Signed)
Patient ID: Jason [name] MRN: [id number] DOB/AGE: January 16, 2001 21 y.o.  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 He is s/p coil embolization of his R ICA paraclinoid pseudoaneurysm yesterday by Dr. Corliss Skains.  He was placed in observation overnight.  He did have a headache overnight which responded to pain medication and has not returned. He also had several episodes of N/V which responded to anti-emetic and has not returned.  He is planning to attempt breakfast soon.  Has been able to urinate and ambulate in room without difficulty.  Mother and step-father at bedside alongside Dr. Corliss Skains who has reviewed the procedure, images, and plan of care.  All questions are answered and Jason [name] is ready for discharge home today.  He is to initiate Brilinta 90mg  BID and aspirin 81mg  daily until next follow-up with Dr. when discussion on long-term Brilinta need will be addressed.  He is given care and recovery instructions.  He understands schedulers will contact him with date and time of follow-up appointment.   Discharge Exam: Blood pressure (!) 113/53, pulse 63, temperature 98.4 F (36.9 C), temperature source Oral, resp. rate 19, height 5\' 10"  (1.778 m),  weight 165 lb 5.5 oz (75 kg), SpO2 99 %. General appearance: alert, cooperative, and no distress Resp: clear to auscultation bilaterally Cardio: regular rate and rhythm, S1, S2 normal, no murmur, click, rub or gallop GI: soft, non-tender; bowel sounds normal; no masses,  no organomegaly Pulses: 2+ and symmetric Skin: Skin color, texture, turgor normal. No rashes or lesions Neurologic: Alert and oriented X 3, normal strength and tone. Normal symmetric reflexes. Normal coordination and gait. Right eye with limited lateral gaze. Right facial droop.  Speech intact. Tongue deviates to the left.  Disposition: Discharge disposition: 01-Home or Self Care       Discharge Instructions     Diet - low sodium heart healthy   Complete by: As directed    Discharge instructions   Complete by: As directed    May remove bandages to groin sites and replace as needed. No bending, lifting, or stooping for 2 weeks.  May resume physical therapy as scheduled with above restrictions.  No driving for 2 weeks.  Schedulers will contact you with date and time of follow-up appointment. Start Brilinta 90mg  BID, aspirin 81mg  daily until next follow-up with Dr. .   Increase activity slowly   Complete by: As directed       Allergies as of 07/17/2021   No Known Allergies      Medication List     TAKE these medications    aspirin 81 MG chewable tablet Chew 1 tablet (81 mg total) by mouth daily. Start taking on: July 18, 2021   benzonatate 100 MG capsule Commonly known as: TESSALON Take 1 capsule (100 mg total) by mouth every 8 (eight) hours.   lidocaine 2 %  solution Commonly known as: XYLOCAINE Use as directed 15 mLs in the mouth or throat every 4 (four) hours as needed for mouth pain.   ondansetron 4 MG tablet Commonly known as: Zofran Take 1 tablet (4 mg total) by mouth every 8 (eight) hours as needed for nausea or vomiting.   ticagrelor 90 MG Tabs tablet Commonly known as:  BRILINTA Take 1 tablet (90 mg total) by mouth 2 (two) times daily.   tiZANidine 4 MG capsule Commonly known as: Zanaflex Take 1 capsule (4 mg total) by mouth at bedtime as needed for muscle spasms.   valACYclovir 1000 MG tablet Commonly known as: VALTREX Take 1 tablet (1,000 mg total) by mouth 3 (three) times daily.        Follow-up Information     Julieanne Cotton, MD Follow up.   Specialties: Interventional Radiology, Radiology Why: Schedulers will call with date and time of follow-up appointment expected in 2 weeks. Contact information: [address] [place] [address] [phone]                  Electronically Signed: Hoyt Koch, PA 07/17/2021, 11:25 AM   I have spent Greater Than 30 Minutes discharging [facility].

## 2021-07-17 NOTE — Progress Notes (Signed)
Discharge vitals. All IV's removed. Answered all discharge and AVS questions with patient and family.   07/17/21 1125  Vitals  Temp 98.5 F (36.9 C)  Temp Source Oral  BP 129/65  MAP (mmHg) 82  Pulse Rate 74  ECG Heart Rate 80  Oxygen Therapy  SpO2 100 %  O2 Device Room Air  MEWS Score  MEWS Temp 0  MEWS Systolic 0  MEWS Pulse 0  MEWS RR 0  MEWS LOC 0  MEWS Score 0  MEWS Score Color Green

## 2021-07-17 NOTE — Progress Notes (Signed)
°  Transition of Care [facility]) Screening Note   Patient Details  Name: Jason [name] Date of Birth: 07/05/2001   Transition of Care [facility]) CM/SW Contact:    Mearl Latin, LCSW Phone Number: 07/17/2021, 8:58 AM    Transition of Care Department [facility]) has reviewed patient and no TOC needs have been identified at this time. We will continue to monitor patient advancement through interdisciplinary progression rounds. If new patient transition needs arise, please place a TOC consult.

## 2021-07-17 NOTE — Discharge Summary (Signed)
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 transfemoral access into the left common femoral artery was obtained without difficulty. Over a 0.035 inch guidewire, a 5 [place] JB 1 catheter was advanced to the right common carotid artery. The guidewire was removed. Good aspiration from the hub of the 5 French diagnostic catheter in the right common carotid artery. Diagnostic catheter was then performed centered extra cranially and intracranially. The right groin was prepped and draped in the usual sterile fashion. Thereafter using modified Seldinger technique, transfemoral access into the right common femoral vein was obtained without difficulty. Over a 0.035 inch guidewire, a 5 French Pinnacle sheath was inserted. Through this, and also over 0.035 inch guidewire, a 5 [place] JB 1 catheter was advanced to abdominal inferior vena cava. Over an 035 inch J guidewire, the JB 1 catheter was advanced without difficulty to the inferior vena cava through into the right superior vena cava and subsequently into the right internal jugular vein in the mid cervical segment. The guidewire was removed. Good aspiration obtained from the hub of the 5 French catheter. Contrast injection demonstrated safe positioning of the tip of the 5 [place] diagnostic catheter which was then connected to continuous heparinized saline infusion. FINDINGS: A right subclavian arteriogram  demonstrates the right vertebral artery origin to be widely patent. The vessel is seen to opacify to the cranial skull base. The right common carotid arteriogram demonstrates the right external carotid artery and its major branches to be widely patent. The right internal carotid artery at the bulb to the cranial skull base is widely patent. The petrous, the cavernous and the supraclinoid segments are widely patent. The previously positioned 3 pipeline flow diverter devices straddling the petrous cavernous junction appear widely patent without evidence of intra stenosis. More distally, the right internal carotid artery caval and supraclinoid segments demonstrate wide patency. The right middle cerebral artery and the right anterior cerebral artery opacify into the capillary and venous phases. Demonstrated is the now enlarged almost bilobed pseudoaneurysm arising from the petrous cavernous right ICA. Measurements are approximately 16 mm x 13 mm. Also noted is brisk decompression into the posterior aspect of the right cavernous sinus and drainage into the right inferior petrosal sinus and subsequently into the right internal jugular vein. ENDOVASCULAR OBLITERATION OF ENLARGING RIGHT INTERNAL CAROTID ARTERY PSEUDOANEURYSM VIA TRANSVENOUS APPROACH. The diagnostic JB 1 catheter in the right common carotid artery was then connected to continuous heparinized saline infusion for future injections into the venous phase. The diagnostic JB 1 catheter in the right internal jugular vein was then exchanged over an 0.035 inch 300 cm Rosen exchange guidewire for a 90 cm 8 [place] Neuron Max sheath. The Neuron Max sheath was advanced under fluoroscopic guidance to the right internal carotid artery just proximal to the skull base. The guidewire was  removed. Good aspiration obtained from the hub of the 8 [place] Neuron Max sheath. The venogram obtained through Neuron Max sheath demonstrated retrograde opacification of basilar venous  plexus, and also the right internal jugular bulb. Over an 035 inch Roadrunner guidewire, a 125 cm 6 [place] Berenstein catheter was then advanced to the distal end of the Neuron Max sheath. Using biplane roadmap technique under constant fluoroscopic guidance, the 035 inch Roadrunner guidewire was advanced without difficulty into the orifice of the right inferior petrosal sinus followed by the [facility] support catheter. The guidewire was removed. Good aspiration obtained from the hub of the [facility] support catheter. Gentle control arteriogram performed through the [facility] diagnostic catheter in the inferior petrosal sinus demonstrated opacification of the pseudoaneurysm, and the right cavernous sinus. At this time over a 0.014 inch standard Synchro micro guidewire with a moderate configuration, an 010 Echelon 2 tip microcatheter was then advanced to the distal end of the Painter support catheter. Using a torque device, the micro guidewire was advanced into the large bilobed pseudoaneurysm followed by the microcatheter, which was advanced to form a loop configuration in the larger lobe of the pseudo aneurysm. The guidewire was removed. Good aspiration obtained from the hub of the microcatheter. A gentle control arteriogram performed through the microcatheter demonstrated safe positioning of tip of the microcatheter which was then connected to continuous heparinized saline infusion. Embolization was then ready to begin. The first coil utilized was a 16 x 40 Axium coil. This was advanced to distal end of the intra aneurysmal microcatheter. Thereafter, the coil was delivered under fluoroscopic guidance. This was then detached in the usual manner without any difficulty. This was subsequently followed by placement of a 14 x 40 Axium framing coil, a 12 x 40 Axium framing coil, a 10 x 20 Axium framing coil, a 7 x 30 Axium framing coil, a 7 x 30 Axium framing coil, a 6 x 20 Bare helical coil, a 6 x 20 Bare helical  coil, a 5 x 20 helix Bare coil, a 5 x 20 helix Bare coil, a 4 x 10 Bare helix coil, a 5 x 15 Bare helix coil, a 3.5 x 8 helix 3D coil, a 3 x 8 Bare helix coil, and finally a Bare helix 3 x 15 coil. Each coil was advanced into the aneurysm under fluoroscopic guidance. At least 3 control arteriograms were performed during this embolization to ensure safe configuration of the coil mass. A final control arteriogram performed through the 5 French diagnostic catheter in the right common carotid artery demonstrated complete angiographic obliteration of the large bilobed pseudoaneurysm, with no flow noted into the inferior petrosal sinus. Intracranially, the right internal carotid artery, and the right middle and the right anterior cerebral territories remain widely patent without evidence of intraluminal filling defects or of occlusions. The 8 [place] Neuron Max sheath was then retrieved and removed and pressure held for hemostasis at the right groin puncture site. The left common femoral arterial sheath was removed with hemostasis achieved with a 5 [place] Exoseal closure device and manual compression. Distal pulses remained palpable in both feet unchanged. CT of the brain demonstrated no evidence of intracranial hemorrhage, mass effect or of hydrocephalus. Patient's general anesthesia was reversed. The patient was extubated without any difficulty. Upon recovery, the patient denied any headaches, nausea or vomiting. He was alert enough to be oriented to place and year. Patient was able to move all 4 extremities equally. He was then transferred to the PACU and then  neuro ICU. Overnight, the patient had an approximately 7 to 8/10 what appeared to be a generalized headache with nausea and vomiting x 2. This responded to Zofran IV, and also Toradol IV 30 mg Q 6 hourly p.r.n. for headache. In the morning, patient was devoid of any headaches, nausea or vomiting. Patient was able to handle oral solid food and liquids without any  difficulty. Neurologically, the patient was alert, awake, oriented to time, place, space. He continued to have difficulty with diplopia on both left and right horizontal gaze without nystagmus. There was moderate improvement in the right eye ptosis. Right pupil remained to approximately 4 mm and the left at 3 mm. However, both reacted directly and consensually to light. Patient was ambulated independently He was then discharged in the care of his parents. Specific instructions were those of maintaining adequate hydration, and to be taking dual antiplatelets for at least 2 weeks. At that time, the patient will see me in outpatient clinic. The decision will then made regarding creasing the Brilinta dose to 45 mg b.i.d. Patient was also advised to refrain from stooping, bending or lifting more than 10 pounds for 2 weeks. For other questions or concerns that they may have, the patient and the parents were instructed to call. Should the patient have any stroke-like symptoms, patient was instructed to call 911. 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 PLAN: Follow-up in the clinic approximately 2 weeks from discharge. Electronically Signed   By: Luanne Bras M.D.   On: 07/18/2021 07:46   IR CT Head Ltd  Result Date: 07/18/2021 CLINICAL DATA:  Expanding large pseudoaneurysm right internal carotid artery petrous cavernous junction with decompression into the right cavernous sinus. EXAM: TRANSCATHETER THERAPY EMBOLIZATION COMPARISON:  CT angiogram of the head and neck of July 14, 2021. MEDICATIONS: Heparin 3,000 units IV; Ancef 2 g IV antibiotic was administered within 1 hour of the procedure. ANESTHESIA/SEDATION: General anesthesia. CONTRAST:  Omnipaque 300 approximately 160 mL. FLUOROSCOPY TIME:   Fluoroscopy Time: 53 minutes 42 seconds (1922 mGy). COMPLICATIONS: None immediate. TECHNIQUE: Informed written consent was obtained from the patient after a thorough discussion of the procedural risks, benefits and alternatives. All questions were addressed. Maximal Sterile Barrier Technique was utilized including caps, mask, sterile gowns, sterile gloves, sterile drape, hand hygiene and skin antiseptic. A timeout was performed prior to the initiation of the procedure. The left groin was then prepped and draped in the usual sterile fashion. Thereafter using modified Seldinger technique transfemoral access into the left common femoral artery was obtained without difficulty. Over a 0.035 inch guidewire, a 5 [place] JB 1 catheter was advanced to the right common carotid artery. The guidewire was removed. Good aspiration from the hub of the 5 French diagnostic catheter in the right common carotid artery. Diagnostic catheter was then performed centered extra cranially and intracranially. The right groin was prepped and draped in the usual sterile fashion. Thereafter using modified Seldinger technique, transfemoral access into the right common femoral vein was obtained without difficulty. Over a 0.035 inch guidewire, a 5 French Pinnacle sheath was inserted. Through this, and also over 0.035 inch guidewire, a 5 [place] JB 1 catheter was advanced to abdominal inferior vena cava. Over an 035 inch J guidewire, the JB 1 catheter was advanced without difficulty to the inferior vena cava through into the right superior vena  cava and subsequently into the right internal jugular vein in the mid cervical segment. The guidewire was removed. Good aspiration obtained from the hub of the 5 French catheter. Contrast injection demonstrated safe positioning of the tip of the 5 [place] diagnostic catheter which was then connected to continuous heparinized saline infusion. FINDINGS: A right subclavian arteriogram demonstrates the right  vertebral artery origin to be widely patent. The vessel is seen to opacify to the cranial skull base. The right common carotid arteriogram demonstrates the right external carotid artery and its major branches to be widely patent. The right internal carotid artery at the bulb to the cranial skull base is widely patent. The petrous, the cavernous and the supraclinoid segments are widely patent. The previously positioned 3 pipeline flow diverter devices straddling the petrous cavernous junction appear widely patent without evidence of intra stenosis. More distally, the right internal carotid artery caval and supraclinoid segments demonstrate wide patency. The right middle cerebral artery and the right anterior cerebral artery opacify into the capillary and venous phases. Demonstrated is the now enlarged almost bilobed pseudoaneurysm arising from the petrous cavernous right ICA. Measurements are approximately 16 mm x 13 mm. Also noted is brisk decompression into the posterior aspect of the right cavernous sinus and drainage into the right inferior petrosal sinus and subsequently into the right internal jugular vein. ENDOVASCULAR OBLITERATION OF ENLARGING RIGHT INTERNAL CAROTID ARTERY PSEUDOANEURYSM VIA TRANSVENOUS APPROACH. The diagnostic JB 1 catheter in the right common carotid artery was then connected to continuous heparinized saline infusion for future injections into the venous phase. The diagnostic JB 1 catheter in the right internal jugular vein was then exchanged over an 0.035 inch 300 cm Rosen exchange guidewire for a 90 cm 8 [place] Neuron Max sheath. The Neuron Max sheath was advanced under fluoroscopic guidance to the right internal carotid artery just proximal to the skull base. The guidewire was removed. Good aspiration obtained from the hub of the 8 [place] Neuron Max sheath. The venogram obtained through Neuron Max sheath demonstrated retrograde opacification of basilar venous plexus, and also the right  internal jugular bulb. Over an 035 inch Roadrunner guidewire, a 125 cm 6 [place] Berenstein catheter was then advanced to the distal end of the Neuron Max sheath. Using biplane roadmap technique under constant fluoroscopic guidance, the 035 inch Roadrunner guidewire was advanced without difficulty into the orifice of the right inferior petrosal sinus followed by the [facility] support catheter. The guidewire was removed. Good aspiration obtained from the hub of the [facility] support catheter. Gentle control arteriogram performed through the [facility] diagnostic catheter in the inferior petrosal sinus demonstrated opacification of the pseudoaneurysm, and the right cavernous sinus. At this time over a 0.014 inch standard Synchro micro guidewire with a moderate configuration, an 010 Echelon 2 tip microcatheter was then advanced to the distal end of the [place] support catheter. Using a torque device, the micro guidewire was advanced into the large bilobed pseudoaneurysm followed by the microcatheter, which was advanced to form a loop configuration in the larger lobe of the pseudo aneurysm. The guidewire was removed. Good aspiration obtained from the hub of the microcatheter. A gentle control arteriogram performed through the microcatheter demonstrated safe positioning of tip of the microcatheter which was then connected to continuous heparinized saline infusion. Embolization was then ready to begin. The first coil utilized was a 16 x 40 Axium coil. This was advanced to distal end of the intra aneurysmal microcatheter. Thereafter, the coil was delivered under fluoroscopic guidance. This was  then detached in the usual manner without any difficulty. This was subsequently followed by placement of a 14 x 40 Axium framing coil, a 12 x 40 Axium framing coil, a 10 x 20 Axium framing coil, a 7 x 30 Axium framing coil, a 7 x 30 Axium framing coil, a 6 x 20 Bare helical coil, a 6 x 20 Bare helical coil, a 5 x 20 helix Bare  coil, a 5 x 20 helix Bare coil, a 4 x 10 Bare helix coil, a 5 x 15 Bare helix coil, a 3.5 x 8 helix 3D coil, a 3 x 8 Bare helix coil, and finally a Bare helix 3 x 15 coil. Each coil was advanced into the aneurysm under fluoroscopic guidance. At least 3 control arteriograms were performed during this embolization to ensure safe configuration of the coil mass. A final control arteriogram performed through the 5 French diagnostic catheter in the right common carotid artery demonstrated complete angiographic obliteration of the large bilobed pseudoaneurysm, with no flow noted into the inferior petrosal sinus. Intracranially, the right internal carotid artery, and the right middle and the right anterior cerebral territories remain widely patent without evidence of intraluminal filling defects or of occlusions. The 8 [place] Neuron Max sheath was then retrieved and removed and pressure held for hemostasis at the right groin puncture site. The left common femoral arterial sheath was removed with hemostasis achieved with a 5 [place] Exoseal closure device and manual compression. Distal pulses remained palpable in both feet unchanged. CT of the brain demonstrated no evidence of intracranial hemorrhage, mass effect or of hydrocephalus. Patient's general anesthesia was reversed. The patient was extubated without any difficulty. Upon recovery, the patient denied any headaches, nausea or vomiting. He was alert enough to be oriented to place and year. Patient was able to move all 4 extremities equally. He was then transferred to the PACU and then neuro ICU. Overnight, the patient had an approximately 7 to 8/10 what appeared to be a generalized headache with nausea and vomiting x 2. This responded to Zofran IV, and also Toradol IV 30 mg Q 6 hourly p.r.n. for headache. In the morning, patient was devoid of any headaches, nausea or vomiting. Patient was able to handle oral solid food and liquids without any difficulty. Neurologically,  the patient was alert, awake, oriented to time, place, space. He continued to have difficulty with diplopia on both left and right horizontal gaze without nystagmus. There was moderate improvement in the right eye ptosis. Right pupil remained to approximately 4 mm and the left at 3 mm. However, both reacted directly and consensually to light. Patient was ambulated independently He was then discharged in the care of his parents. Specific instructions were those of maintaining adequate hydration, and to be taking dual antiplatelets for at least 2 weeks. At that time, the patient will see me in outpatient clinic. The decision will then made regarding creasing the Brilinta dose to 45 mg b.i.d. Patient was also advised to refrain from stooping, bending or lifting more than 10 pounds for 2 weeks. For other questions or concerns that they may have, the patient and the parents were instructed to call. Should the patient have any stroke-like symptoms, patient was instructed to call 911. 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 PLAN: Follow-up in the clinic approximately 2 weeks from discharge. Electronically  Signed   By: Luanne Bras M.D.   On: 07/18/2021 07:46   IR CT Head Ltd  Result Date: 06/20/2021 CLINICAL DATA:  Closed head trauma with skull base fracture. 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 EXAM: TRANSCATHETER THERAPY EMBOLIZATION COMPARISON:  CTA head and neck of 06/17/2021. MEDICATIONS: Heparin 3,500 units IV. Ancef 2 g IV antibiotic was administered within 1 hour of the procedure. ANESTHESIA/SEDATION: General anesthesia. CONTRAST:  Omnipaque 300 approximately 100 cc. FLUOROSCOPY TIME:   Fluoroscopy Time: 50 minutes 36 seconds (2495. mGy). COMPLICATIONS: None immediate. TECHNIQUE: Informed written consent was obtained from the patient after a thorough discussion of the procedural risks, benefits and alternatives. All questions were addressed. Maximal Sterile Barrier Technique was utilized including caps, mask, sterile gowns, sterile gloves, sterile drape, hand hygiene and skin antiseptic. A timeout was performed prior to the initiation of the procedure. The right groin was prepped and draped in the usual sterile fashion. Thereafter using modified Seldinger technique, transfemoral access into the right common femoral artery was obtained without difficulty. Over a 0.035 inch guidewire, an 8 [place] Pinnacle 25 sheath was inserted. Through this, and also over 0.035 inch guidewire, a 5 [place] JB 1 catheter was advanced to the aortic arch region and selectively positioned in the right common carotid artery. FINDINGS: The right common carotid arteriogram demonstrates the right external carotid artery and its major branches to be widely patent. The right internal carotid artery at the bulb to the cranial skull base is widely patent. The proximal petrous segment is unremarkable. There is again noted tapered narrowing of the petrous cavernous junction with wide patency of the previously positioned flow diverter across the neck of, and now enlarging pseudoaneurysm involving the site of dissection. Brisk opacification into the right inferior petrosal sinus is noted with subsequent opacification of the right internal jugular vein. 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 More distally, the distal cavernous, the supraclinoid segments demonstrate wide patency. The right middle and the right anterior cerebral arteries opacify into the capillary and venous phases. ENDOVASCULAR RECONSTRUCTION OF ENLARGING RIGHT INTERNAL CAROTID ARTERY PETROUS CAVERNOUS JUNCTION PSEUDOANEURYSM WITH CC  FISTULA. The diagnostic JB 1 catheter in the right common carotid artery was then exchanged over a 0.035 inch 300 cm Rosen exchange guidewire for a 95 cm 087 balloon guide catheter which had been prepped with 50% contrast and 50% heparinized saline infusion. Guidewire was removed. Good aspiration obtained from the hub of the balloon guide catheter which was advanced to the proximal right ICA. Over a 0.035 inch Roadrunner guidewire, a 5 French 115 cm Catalyst guide catheter was then advanced to the distal right internal carotid artery at the cervical petrous junction. The guidewire was removed. Good aspiration obtained from the hub of the 5 [place] guide catheter. Control arteriogram was then performed in the biplane distally mode. An 027 150 cm with a moderate J configuration to the distal end of the 5 [place] Catalyst guide catheter. With the micro guidewire leading, access was obtained through the previously positioned pipeline flow diverter to the supraclinoid right ICA with the micro guidewire followed by the microcatheter and then the right middle cerebral artery M1 M2 junction of the inferior division. The guidewire was removed. Good aspiration obtained from the hub of the microcatheter. Gentle control arteriogram performed through the Phenom microcatheter demonstrated safe positioning of the tip of the microcatheter. This was then connected to continuous  heparinized saline infusion. Measurements were then performed of the distal and the proximal landing zones of the internal carotid artery. A 5 mm x 30 mm pipeline shield flow diverter device was then chosen. This was then advanced in a coaxial manner with constant heparinized saline infusion at the distal end of the microcatheter. The entire system was then straightened. The microcatheter was retrieved through the distal wire and the distal few mm of the pipeline flow diverter device. Once fully opened, the combination was retrieved into the distal landing zone  in the horizontal segment of the proximal cavernous right ICA. Thereafter, under constant intermittent fluoroscopy, the push and pull technique and advancement of the micro guidewire, the pipeline flow diverter device was then deployed. Once fully deployed, control arteriogram performed through the 5 [place] Catalyst guide catheter demonstrated excellent apposition of the device with continued flow into the CC fistula. It was, therefore, decided to proceed with placement of a second pipeline flow diverter device in order to provide more coverage at the site of the dissection. A 5 mm x 60 mm pipeline shield flow diverter device was then advanced to the distal end of the microcatheter which had been advanced into the proximal right M1 segment. The distal wire and the few mm of the device were unsheathed in the supraclinoid right ICA. Once fully opened, the combination was retrieved such that it was now positioned within the 5 mm x 30 mm just deployed. Using advancement of the micro guidewire, and fluffing technique, the second device was then deployed without any difficulty. The microcatheter was then gently advanced through the construct into the right middle cerebral artery where the wire was captured into the microcatheter and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 issues with orthostatic syncope and was treated with IV fluids in addition to water flushes.  Blood pressures improved and stabilized with increase in activity tolerance and tachycardia maintained with low-dose metoprolol.  Follow-up labs showed electrolytes and renal status to be within normal limits.  Repeat CBC showed H&H with mild drop without signs of bleeding.  His mentation has steadily improved and diet was gradually advanced to regular.  He had had issues with urinary retention requiring Foley placement.  He was started on silodosin on 12/16 but discontinued due to recurrent syncopal episode.  Abdominal binder and teds with use for support and Norvasc was discontinued.  Stools are managed, fiber he was started on voiding trial with addition of Urecholine to improve voiding function. He did require in and out caths initially however with increasing mobility and mentation he was noted to be voiding without difficulty.  Seroquel and Klonopin were slowly weaned off.  Neuropsychology following for support and evaluation of mood.  Dr. Sima Matas has worked as well as education on importance of alcohol/marijuana cessation.  NG tube was discontinued by 12/27.   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  Patient made good gains during his rehab stay and was meeting goals.  He was discharged to acute floor on 01/03 for cerebral  angiogram.  Outpatient PT, OT and ST at [facility] after discharge.    Rehab course: During patient's stay in rehab weekly team conferences were held to monitor patient's progress, set goals and discuss barriers to discharge. At admission, patient required total assist +2 with basic self-care tasks and mod to max assist +2 for mobility.  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  He was able to follow one-step commands with 25% accuracy and max cues. He  has had improvement in activity tolerance, balance, postural control as well as ability to compensate for deficits. He  has had improvement in functional use LUE  and LLE as well as improvement in awareness. He is able to complete ADLs at modified independent level. He requires supervision for transfers and to ambulate 1500' with cues and without AD.   He is tolerating regular textures with efficient mastication and no signs of aspiration. He is able to complete cognitive tasks at modified independent level. Family education has been completed.    Diet: Regular  Medications at discharge: Biotin mouthwash twice daily ASA  81 mg p.o. daily Lovenox 40 mg Pepcid 10 mg p.o. twice daily Gabapentin 100 mg p.o. twice daily Metoprolol 12.5 mg p.o. twice daily 7.  Seroquel 12.5 mg p.o. twice daily as needed 8.  Brilinta 90 mg p.o. twice daily 9.  Trazodone 50 mg p.o. nightly 10.  Tylenol 650 mg p.o. 4 times daily as needed pain   Special instructions:  Will need to follow up with Dr. Stanford Breed or vascular for follow up CTA to evaluate low grade aortic injury after discharge.   Disposition: Acute Hospital     Follow-up Information     Meredith Staggers, MD Follow up.   Specialty: Physical Medicine and Rehabilitation Why: office will call you with follow up appointment Contact information: [address] [place]  [address] [phone]         Luanne Bras, MD Follow up.   Specialties: Interventional Radiology, Radiology Contact information: [address] [place] [address] [phone]         Jalene Mullet, MD Follow up.   Specialty: Ophthalmology Contact information: [address] [place] [address] [phone]                 Signed: Bary Leriche 07/18/2021, 9:43 AM

## 2021-07-17 NOTE — Progress Notes (Signed)
Patient ambulated one lap around the unit only standby assist. Steady on feet. Uses eye patch when walking. Patient waiting for breakfast to be delivered then will be discharged.  Sherral Hammers RN

## 2021-07-17 NOTE — Progress Notes (Signed)
ART line removed. No complications. Pressure tape applied. Vascular assessment WNL.  Sherral Hammers RN

## 2021-07-18 DIAGNOSIS — D62 Acute posthemorrhagic anemia: Secondary | ICD-10-CM

## 2021-07-21 ENCOUNTER — Telehealth (HOSPITAL_COMMUNITY): Payer: Self-pay

## 2021-07-21 ENCOUNTER — Other Ambulatory Visit (HOSPITAL_COMMUNITY): Payer: Self-pay | Admitting: Interventional Radiology

## 2021-07-21 DIAGNOSIS — I729 Aneurysm of unspecified site: Secondary | ICD-10-CM

## 2021-07-21 NOTE — Telephone Encounter (Signed)
Called to schedule 2 wk f/u, no answer, left vm. AW 

## 2021-07-23 ENCOUNTER — Telehealth (HOSPITAL_COMMUNITY): Payer: Self-pay

## 2021-07-29 ENCOUNTER — Telehealth (HOSPITAL_COMMUNITY): Payer: Self-pay

## 2021-07-29 NOTE — Telephone Encounter (Signed)
Called pt's mother, no answer, left vm. AW

## 2021-07-30 ENCOUNTER — Telehealth (HOSPITAL_COMMUNITY): Payer: Self-pay

## 2021-07-30 NOTE — Telephone Encounter (Signed)
Spoke to pt's mother. Since they live so far away, I have discussed with Dr. Corliss Skains and we will schedule a cta head/neck 6 weeks from his procedure. She agreed with this plan. She said that he is currently doing better. She will call if things change. I will call her back once I receive auth. AW

## 2021-08-01 ENCOUNTER — Ambulatory Visit (HOSPITAL_COMMUNITY): Payer: BC Managed Care – PPO

## 2021-08-05 ENCOUNTER — Other Ambulatory Visit: Payer: Self-pay | Admitting: Internal Medicine

## 2021-08-05 MED ORDER — TICAGRELOR 90 MG PO TABS
90.0000 mg | ORAL_TABLET | Freq: Two times a day (BID) | ORAL | 0 refills | Status: DC
Start: 2021-08-05 — End: 2021-09-11

## 2021-08-05 NOTE — Progress Notes (Signed)
Pt mother notified that Dr Corliss Skains wants pt to continue Brilinta and refill has been sent to pharmacy in [place]. Also discussed Dr. Corliss Skains request for pt to have CT angio head/neck 3-4th week in February for follow up. Pt mother in agreement with continuing Brilinta and follow up scan.    Alex Gardener, AGNP-BC 08/05/2021, 8:50 AM

## 2021-08-06 ENCOUNTER — Telehealth: Payer: Self-pay | Admitting: Physical Medicine & Rehabilitation

## 2021-08-06 ENCOUNTER — Telehealth: Payer: Self-pay

## 2021-08-06 NOTE — Telephone Encounter (Signed)
SW received confirmation from [place] with [place] that referral for physiatrist was received, and pt is scheduled to be see on Monday, Feb 13 at [facility], MSW, LCSWA Office: [phone] Cell: [phone] Fax: [phone]

## 2021-08-06 NOTE — Telephone Encounter (Signed)
Left voicemail for father Everlene Farrier that FMLA paperwork is filled out, and there is a $25 charge.  Paperwork has been left up front and copy sent to scan center and put in book.

## 2021-08-21 ENCOUNTER — Telehealth: Payer: Self-pay | Admitting: Student

## 2021-08-21 ENCOUNTER — Other Ambulatory Visit (HOSPITAL_COMMUNITY): Payer: Self-pay | Admitting: Interventional Radiology

## 2021-08-21 DIAGNOSIS — I729 Aneurysm of unspecified site: Secondary | ICD-10-CM

## 2021-08-21 NOTE — Telephone Encounter (Signed)
Patient to be scheduled for follow up CTA and appointment with Dr. Estanislado Pandy 09/01/21. The patient's mother Elmyra Ricks called IR with concerns that the patient's right eye is a little red/irritated and watery. This is the same eye the patient has issues with ptosis. The patient is not having any other new neurological symptoms. Per Elmyra Ricks his vision is always a little blurry/double. Dr. Estanislado Pandy does not feel an earlier appointment is warranted but Elmyra Ricks was encouraged to call IR if the redness/irritation worsens.   Soyla Dryer, [place] [phone] 08/21/2021, 2:51 PM

## 2021-09-02 ENCOUNTER — Other Ambulatory Visit: Payer: Self-pay

## 2021-09-02 ENCOUNTER — Ambulatory Visit (HOSPITAL_COMMUNITY)
Admission: RE | Admit: 2021-09-02 | Discharge: 2021-09-02 | Disposition: A | Discharge status: Home / Self Care | Payer: BC Managed Care – PPO | Source: Ambulatory Visit | Attending: Interventional Radiology | Admitting: Interventional Radiology

## 2021-09-02 ENCOUNTER — Other Ambulatory Visit (HOSPITAL_COMMUNITY): Payer: Self-pay | Admitting: Interventional Radiology

## 2021-09-02 DIAGNOSIS — I729 Aneurysm of unspecified site: Secondary | ICD-10-CM | POA: Insufficient documentation

## 2021-09-02 IMAGING — CT CT ANGIO NECK
1 of 11 series · 5 of 34 positions shown · non-contrast
Comparison: [DATE] CTA of the head and neck

CLINICAL DATA: Follow-up pseudoaneurysm.

EXAM:
CT ANGIOGRAPHY HEAD AND NECK
TECHNIQUE: Multidetector CT imaging of the head and neck was performed using
the standard protocol during bolus administration of intravenous
contrast. Multiplanar CT image reconstructions and MIPs were
obtained to evaluate the vascular anatomy. Carotid stenosis
measurements (when applicable) are obtained utilizing NASCET
criteria, using the distal internal carotid diameter as the
denominator.

[Series 14: ax thin (person_name) · axial · 0.39mm/px · z∈[-282,-29]mm · 5 of 381 slices shown]
[im 64/381  soft-tissue]
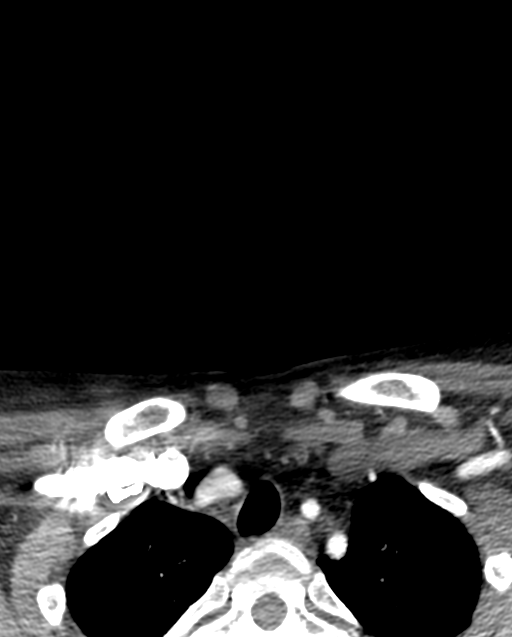
[im 127/381  bone]
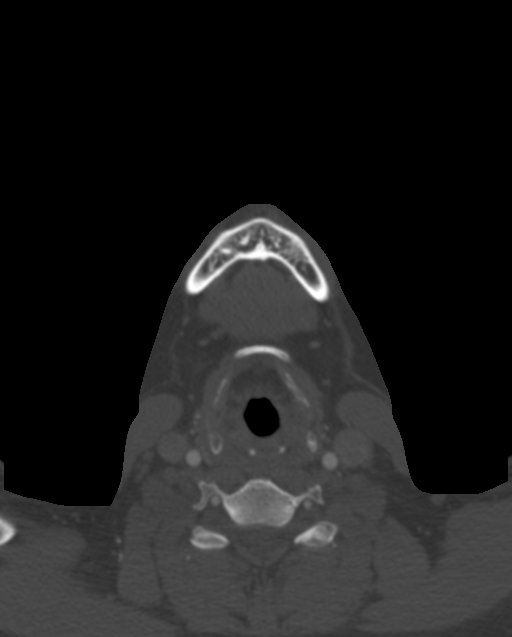
[im 191/381  soft-tissue]
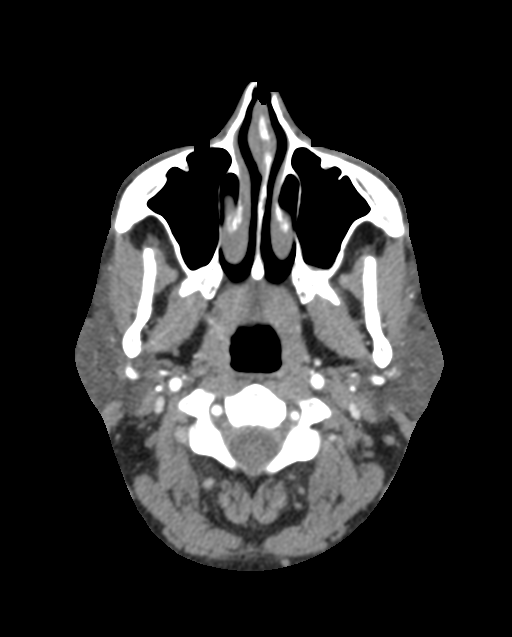
[im 254/381  bone]
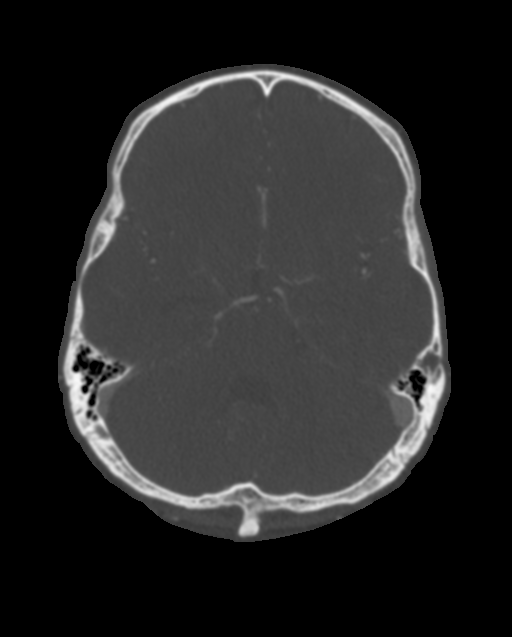
[im 317/381  soft-tissue]
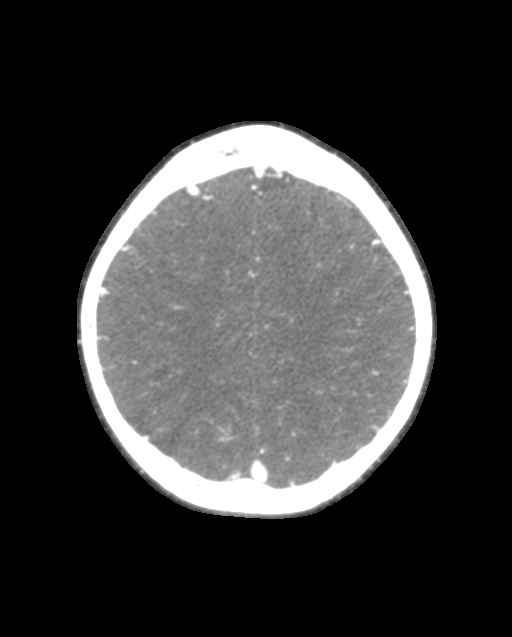

[5 of 34 positions shown; findings below may reference images not displayed]

RADIATION DOSE REDUCTION: This exam was performed according to the
departmental dose-optimization program which includes automated
exposure control, adjustment of the mA and/or kV according to
patient size and/or use of iterative reconstruction technique.

CONTRAST:  75mL OMNIPAQUE IOHEXOL 350 MG/ML SOLN
FINDINGS: CT HEAD FINDINGS

Brain: Remote right parietal and posterior frontal infarct. No
evidence of interval infarct, hemorrhage, hydrocephalus, or
collection. Unavoidable artifact from coil mass at the skull base.

Vascular: No hyperdense vessel or unexpected calcification.

Skull: Normal. Negative for fracture or focal lesion.

Sinuses: Imaged portions are clear.

Orbits: Negative

Review of the MIP images confirms the above findings

CTA NECK FINDINGS

Aortic arch: Normal

Right carotid system: Normal

Left carotid system: Normal

Vertebral arteries: Normal proximal subclavian arteries and right
larger than left vertebral arteries.

Skeleton: Unremarkable

Other neck: Unremarkable

Upper chest: Negative

Review of the MIP images confirms the above findings

CTA HEAD FINDINGS

Anterior circulation: Very dense coil mass with unavoidable artifact
and obscured anatomy at the level of the right cavernous segment. As
before, the now coiled aneurysm encroaches on the right cavernous
sinus and Meckel's cave. No downstream diminished flow or recent
embolic phenomenon seen.

Posterior circulation: Vessels are smoothly contoured and widely
patent. Fetal type left PCA. Negative for aneurysm

Venous sinuses: Diffusely patent.

Anatomic variants: As above

Review of the MIP images confirms the above findings
IMPRESSION: Interval coiling of right ICA pseudoaneurysm with artifact obscuring
regional anatomy. No complicating features or new abnormality.

## 2021-09-02 IMAGING — CT CT ANGIO HEAD
2 of 12 series · 6 of 34 positions shown · non-contrast
Comparison: [DATE] CTA of the head and neck

CLINICAL DATA: Follow-up pseudoaneurysm.

EXAM:
CT ANGIOGRAPHY HEAD AND NECK
TECHNIQUE: Multidetector CT imaging of the head and neck was performed using
the standard protocol during bolus administration of intravenous
contrast. Multiplanar CT image reconstructions and MIPs were
obtained to evaluate the vascular anatomy. Carotid stenosis
measurements (when applicable) are obtained utilizing NASCET
criteria, using the distal internal carotid diameter as the
denominator.

[Series 510: cta head neck thins (person_name) · axial · 0.47mm/px · z∈[-267,-42]mm · 4 of 753 slices shown]
[im 151/753  soft-tissue]
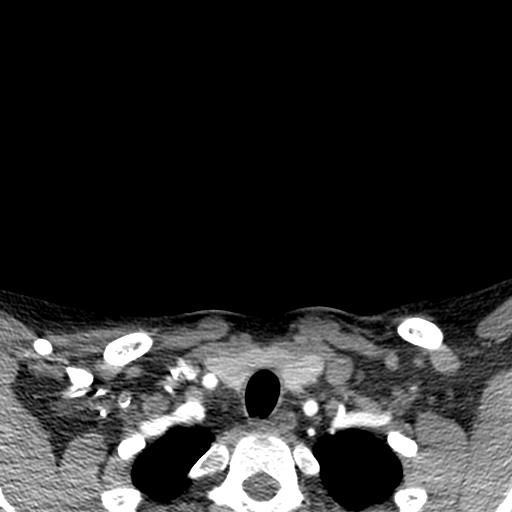
[im 301/753  bone]
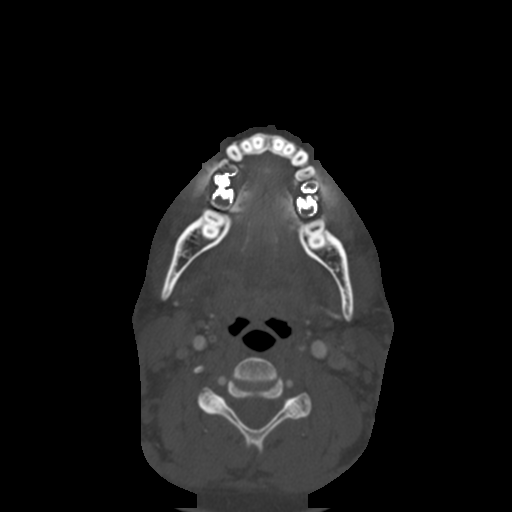
[im 452/753  soft-tissue]
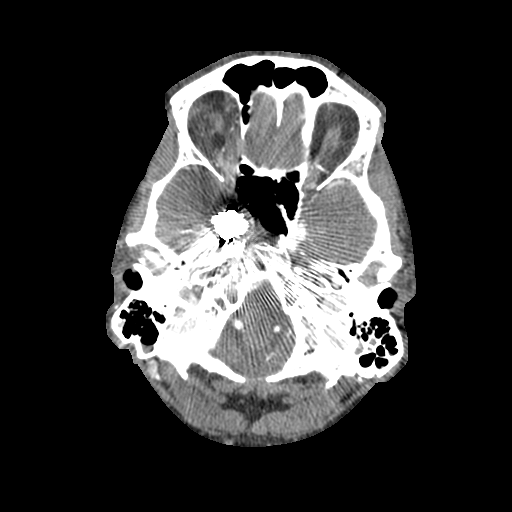
[im 602/753  bone]
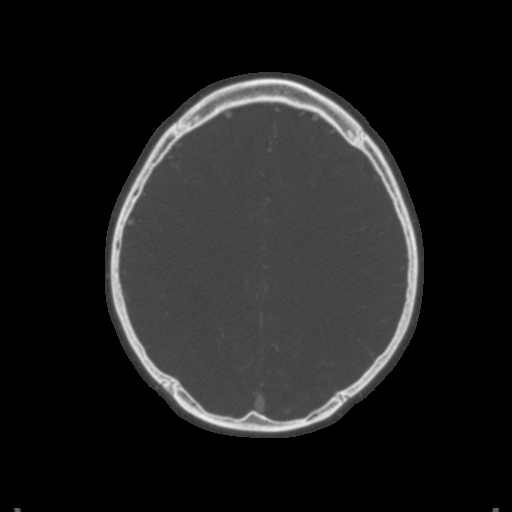

[Series 511: ax thin (person_name) · axial · 0.39mm/px · z∈[-219,-92]mm · 2 of 381 slices shown]
[im 127/381  soft-tissue]
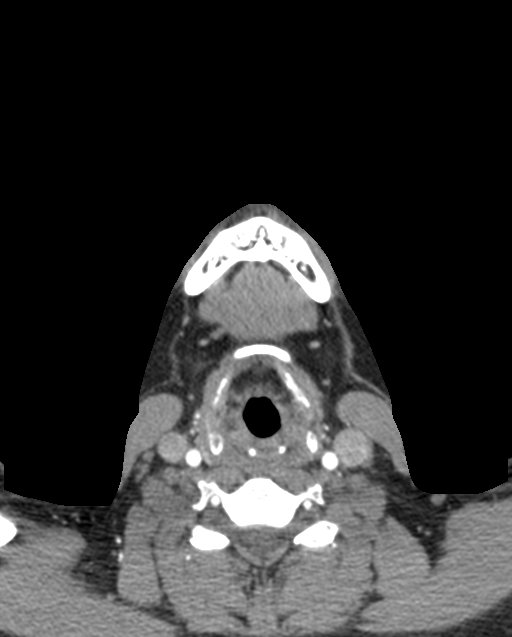
[im 254/381  soft-tissue]
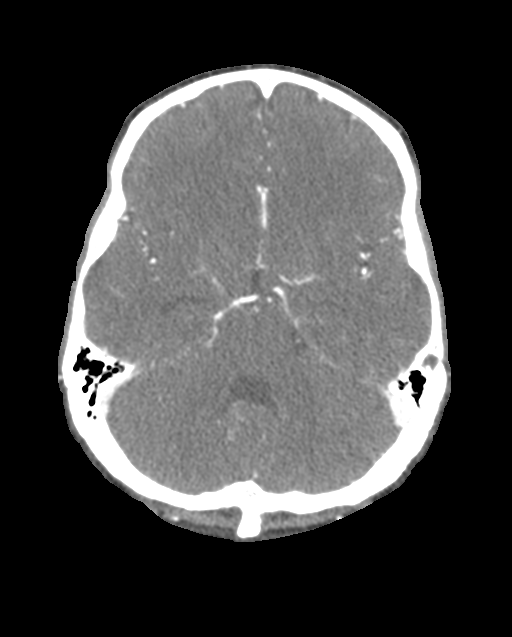

[6 of 34 positions shown; findings below may reference images not displayed]

RADIATION DOSE REDUCTION: This exam was performed according to the
departmental dose-optimization program which includes automated
exposure control, adjustment of the mA and/or kV according to
patient size and/or use of iterative reconstruction technique.

CONTRAST:  75mL OMNIPAQUE IOHEXOL 350 MG/ML SOLN
FINDINGS: CT HEAD FINDINGS

Brain: Remote right parietal and posterior frontal infarct. No
evidence of interval infarct, hemorrhage, hydrocephalus, or
collection. Unavoidable artifact from coil mass at the skull base.

Vascular: No hyperdense vessel or unexpected calcification.

Skull: Normal. Negative for fracture or focal lesion.

Sinuses: Imaged portions are clear.

Orbits: Negative

Review of the MIP images confirms the above findings

CTA NECK FINDINGS

Aortic arch: Normal

Right carotid system: Normal

Left carotid system: Normal

Vertebral arteries: Normal proximal subclavian arteries and right
larger than left vertebral arteries.

Skeleton: Unremarkable

Other neck: Unremarkable

Upper chest: Negative

Review of the MIP images confirms the above findings

CTA HEAD FINDINGS

Anterior circulation: Very dense coil mass with unavoidable artifact
and obscured anatomy at the level of the right cavernous segment. As
before, the now coiled aneurysm encroaches on the right cavernous
sinus and Meckel's cave. No downstream diminished flow or recent
embolic phenomenon seen.

Posterior circulation: Vessels are smoothly contoured and widely
patent. Fetal type left PCA. Negative for aneurysm

Venous sinuses: Diffusely patent.

Anatomic variants: As above

Review of the MIP images confirms the above findings
IMPRESSION: Interval coiling of right ICA pseudoaneurysm with artifact obscuring
regional anatomy. No complicating features or new abnormality.

## 2021-09-02 MED ORDER — SODIUM CHLORIDE (PF) 0.9 % IJ SOLN
INTRAMUSCULAR | Status: AC
  Filled 2021-09-02: qty 50

## 2021-09-02 MED ORDER — IOHEXOL 350 MG/ML SOLN
75.0000 mL | Freq: Once | INTRAVENOUS | Status: AC | PRN
  Administered 2021-09-02: 75 mL via INTRAVENOUS

## 2021-09-04 HISTORY — PX: IR RADIOLOGIST EVAL & MGMT: IMG5224

## 2021-09-10 ENCOUNTER — Telehealth (HOSPITAL_COMMUNITY): Payer: Self-pay

## 2021-09-10 NOTE — Telephone Encounter (Signed)
Called to confirm pharmacy, no answer, left vm. AW ?

## 2021-09-11 ENCOUNTER — Telehealth: Payer: Self-pay | Admitting: Student

## 2021-09-11 MED ORDER — TICAGRELOR 90 MG PO TABS: 45.0000 mg | ORAL_TABLET | Freq: Two times a day (BID) | ORAL | 0 refills | Status: DC

## 2021-09-11 NOTE — Telephone Encounter (Signed)
Brilinta dose reduced from 90 mg BID to 45 mg BID per Dr. Estanislado Pandy I spoke with the patient's mom Elmyra Ricks who states they are running low on Brilinta. New prescription e-prescribed to the [facility] in [place] on [address] 45 mg BID x 90 days.  ? Soyla Dryer, AGACNP-BC ?[phone] ?09/11/2021, 10:42 AM ? ? ?

## 2021-11-06 ENCOUNTER — Other Ambulatory Visit (HOSPITAL_COMMUNITY): Payer: Self-pay | Admitting: Interventional Radiology

## 2021-11-06 ENCOUNTER — Telehealth (HOSPITAL_COMMUNITY): Payer: Self-pay

## 2021-11-06 DIAGNOSIS — I729 Aneurysm of unspecified site: Secondary | ICD-10-CM

## 2021-11-06 NOTE — Telephone Encounter (Signed)
Called to schedule mri, no answer, left vm. AW 

## 2021-12-02 ENCOUNTER — Ambulatory Visit (HOSPITAL_COMMUNITY): Payer: BC Managed Care – PPO

## 2021-12-04 ENCOUNTER — Ambulatory Visit (HOSPITAL_COMMUNITY)
Admission: RE | Admit: 2021-12-04 | Discharge: 2021-12-04 | Disposition: A | Discharge status: Home / Self Care | Payer: BC Managed Care – PPO | Source: Ambulatory Visit | Attending: Interventional Radiology | Admitting: Interventional Radiology

## 2021-12-04 DIAGNOSIS — I729 Aneurysm of unspecified site: Secondary | ICD-10-CM | POA: Diagnosis present

## 2021-12-04 IMAGING — MR MR MRA HEAD W/O CM
1 series · 16 of 16 positions shown · IV contrast (gadavist)
Comparison: None Available.

CLINICAL DATA: Post endovascular repair of dissecting
pseudoaneurysm of the paraclinoid right ICA

EXAM:
MRI HEAD WITHOUT AND WITH CONTRAST
MRA HEAD WITHOUT CONTRAST
TECHNIQUE: Multiplanar, multi-echo pulse sequences of the brain and surrounding
structures were acquired without and with intravenous contrast.
Angiographic images of the Circle of Willis were acquired using MRA
technique without intravenous contrast.
CONTRAST:  8mL GADAVIST GADOBUTROL 1 MMOL/ML IV SOLN

[Series 5: TOF · axial · 1.0mm · 0.43mm/px · z∈[-110,+6]mm · 16 of 232 slices shown]
[im 1/232]
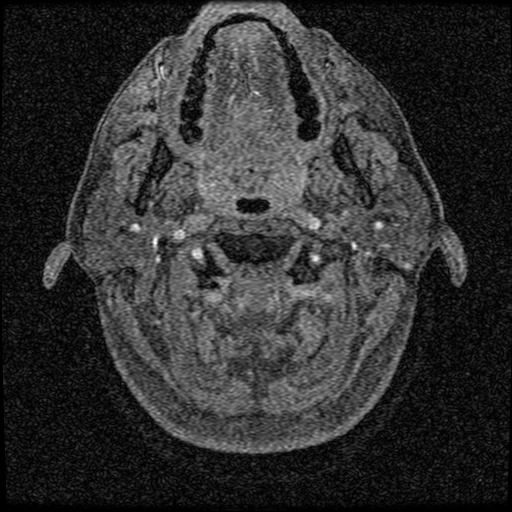
[im 16/232]
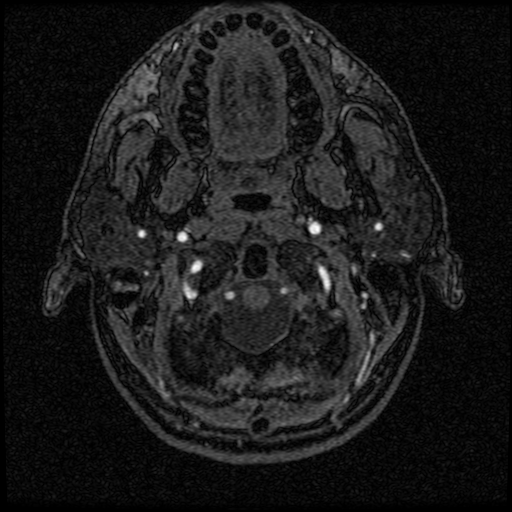
[im 31/232]
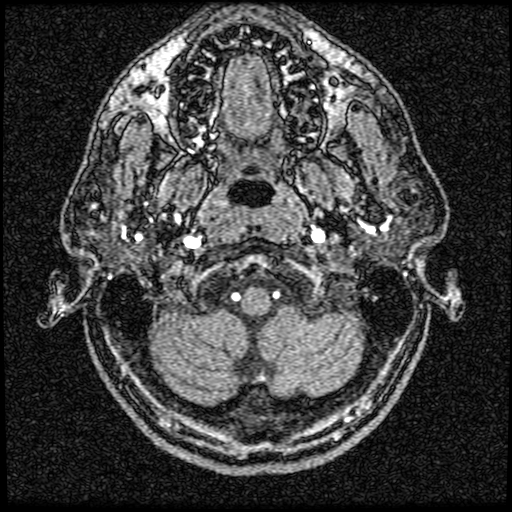
[im 47/232]
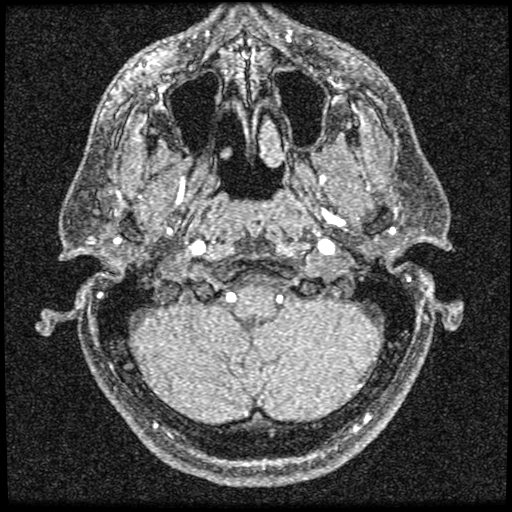
[im 62/232]
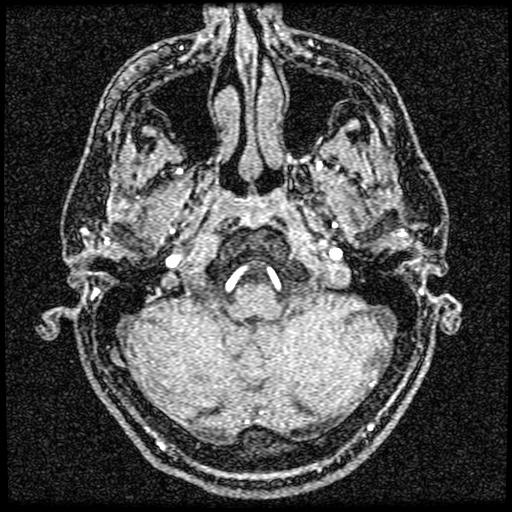
[im 78/232]
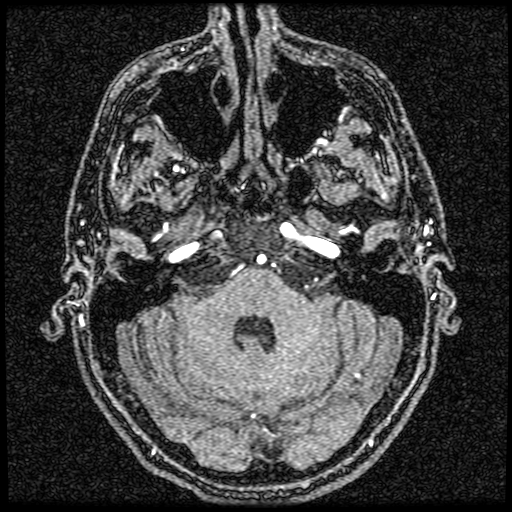
[im 93/232]
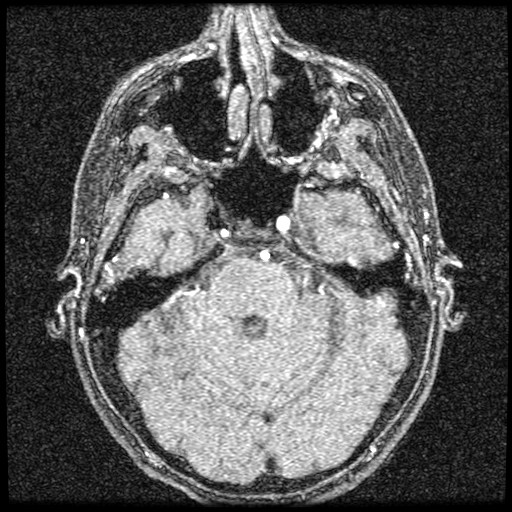
[im 108/232]
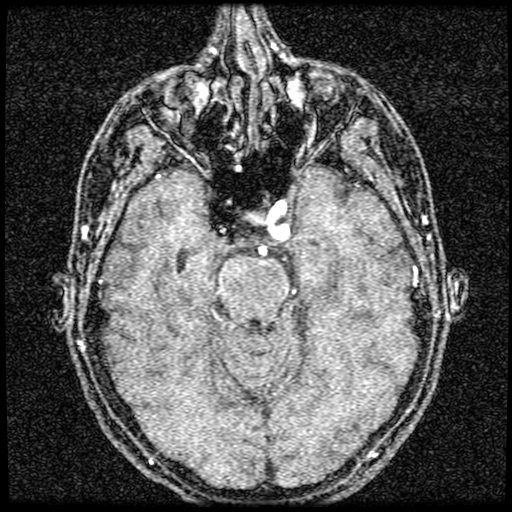
[im 124/232]
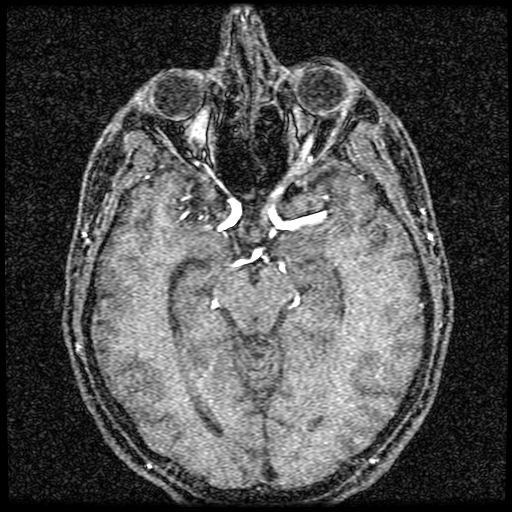
[im 139/232]
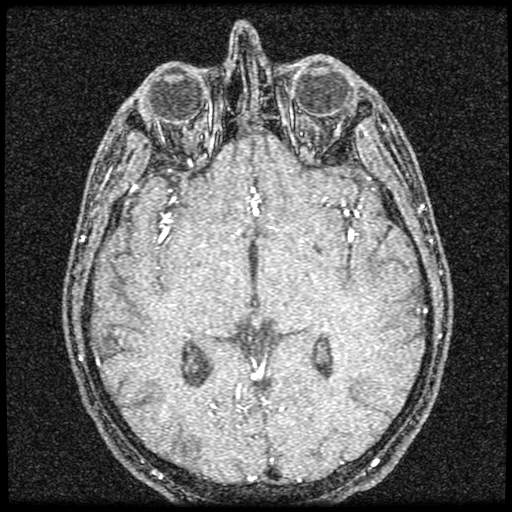
[im 155/232]
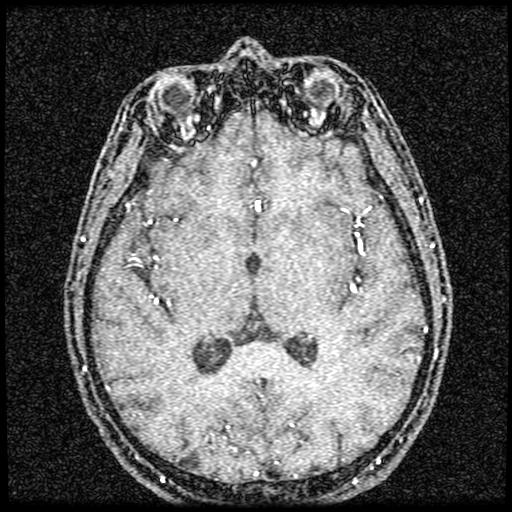
[im 170/232]
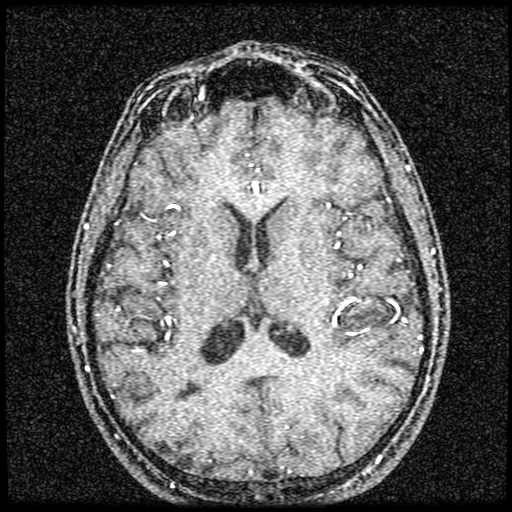
[im 185/232]
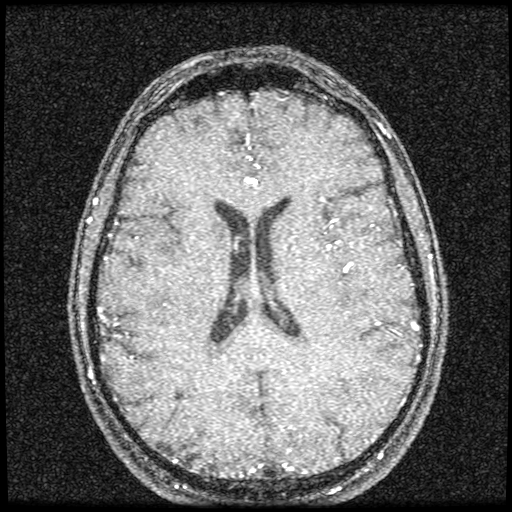
[im 201/232]
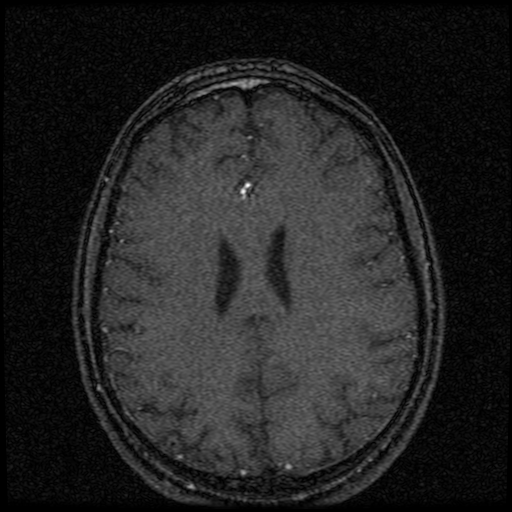
[im 216/232]
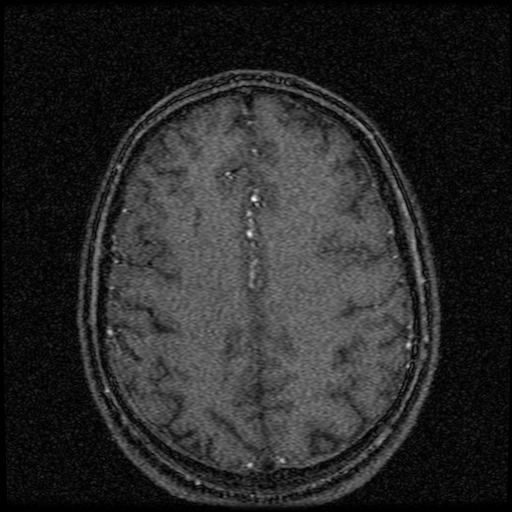
[im 232/232]
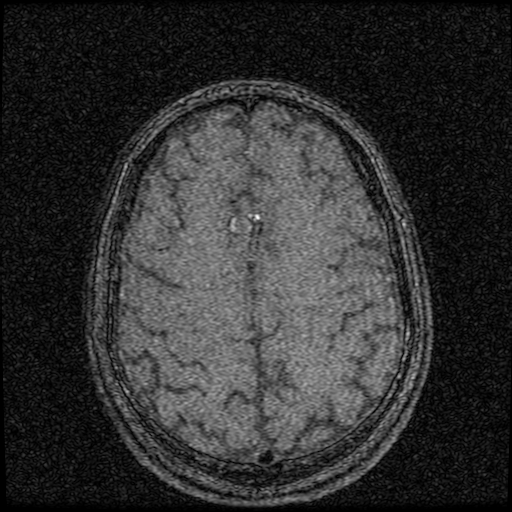

[16 of 16 positions shown; findings below may reference images not displayed]

FINDINGS: MRI HEAD FINDINGS

Brain: There is no acute infarction or intracranial hemorrhage.
Chronic right parietooccipital infarcts. Foci of susceptibility
reflecting chronic blood products are present in this region. There
is no intracranial mass, mass effect, or edema. There is no
hydrocephalus or extra-axial fluid collection. Ventricles and sulci
are within normal limits in size and configuration. No abnormal
enhancement.

Vascular: Major vessel flow voids at the skull base are preserved.
Susceptibility artifact centered in the right cavernous region.
Cavernous sinuses appear to enhance symmetrically within limitation
of the artifact on the right. No dilatation of superior ophthalmic
veins, inferior petrosal, or sphenoparietal sinuses.

Skull and upper cervical spine: Normal marrow signal is preserved.

Sinuses/Orbits: Paranasal sinuses are aerated. Orbits are
unremarkable.

Other: Sella is unremarkable.  Mastoid air cells are clear.

MRA HEAD FINDINGS

Anterior circulation: Intracranial internal carotid arteries are
patent. There is artifactual loss of flow related enhancement in the
vicinity of the coiled aneurysm. No evidence of is recurrent
aneurysm. Anterior and middle cerebral arteries are patent. Anterior
communicating artery is present.

Posterior circulation: Intracranial vertebral arteries, basilar
artery, and posterior cerebral arteries are patent. Left posterior
communicating artery is present.
IMPRESSION: No evidence of recurrent aneurysm or cavernous-carotid fistula.

Chronic right parietooccipital infarcts with foci of chronic blood
products.

## 2021-12-04 IMAGING — MR MR HEAD WO/W CM
7 of 12 series · 32 of 48 positions shown · IV contrast (gadavist)
Comparison: None Available.

CLINICAL DATA: Post endovascular repair of dissecting
pseudoaneurysm of the paraclinoid right ICA

EXAM:
MRI HEAD WITHOUT AND WITH CONTRAST
MRA HEAD WITHOUT CONTRAST
TECHNIQUE: Multiplanar, multi-echo pulse sequences of the brain and surrounding
structures were acquired without and with intravenous contrast.
Angiographic images of the Circle of Willis were acquired using MRA
technique without intravenous contrast.
CONTRAST:  8mL GADAVIST GADOBUTROL 1 MMOL/ML IV SOLN

[Series 3: DWI · axial · 3.0mm · 1.09mm/px · z∈[-108,+45]mm · 7 of 103 slices shown (1 of 4)]
[im 1/103]
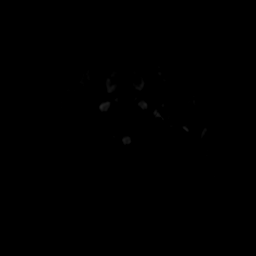
[im 18/103]
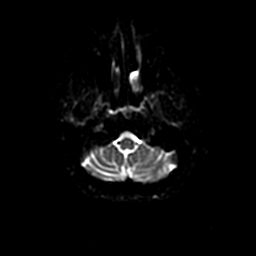
[im 35/103]
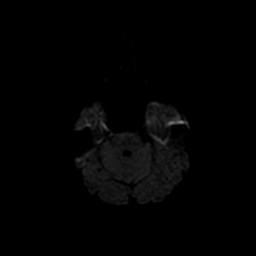
[im 52/103]
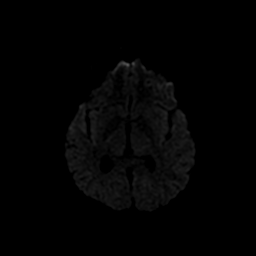
[im 69/103]
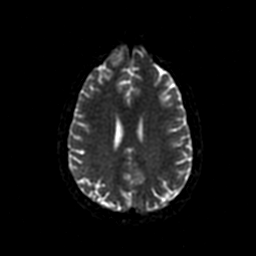
[im 86/103]
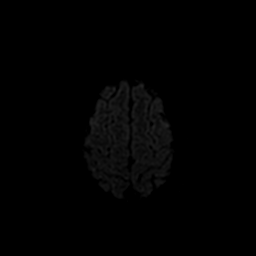
[im 103/103]
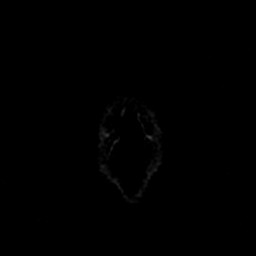

[Series 4: DWI · coronal · 5.0mm · 1.09mm/px · 6 of 80 slices shown (2 of 4)]
[im 1/80]
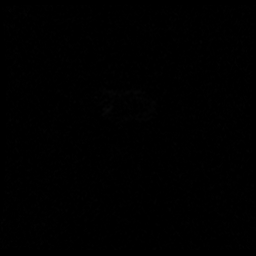
[im 16/80]
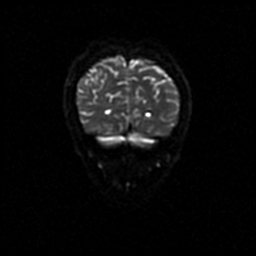
[im 32/80]
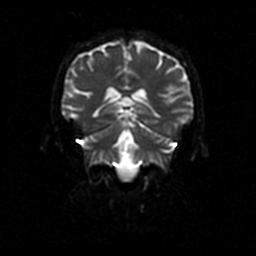
[im 48/80]
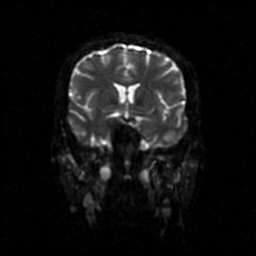
[im 64/80]
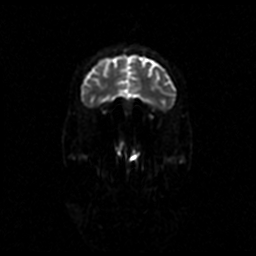
[im 80/80]
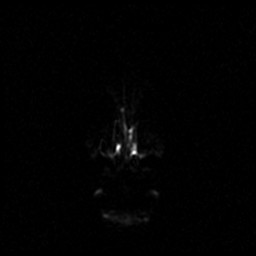

[Series 8: FLAIR · axial · 3.0mm · 0.47mm/px · z∈[-107,+43]mm · 2 of 26 slices shown]
[im 1/26]
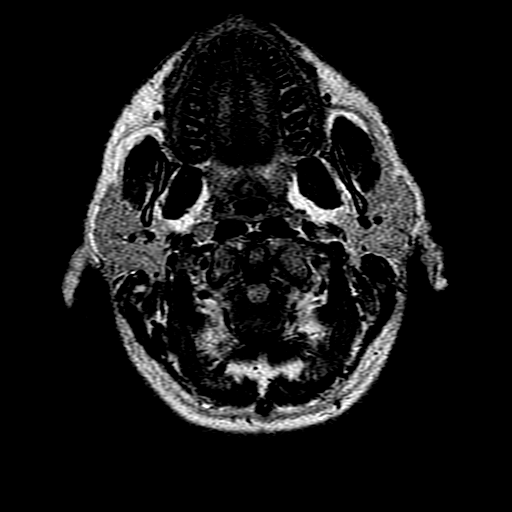
[im 26/26]
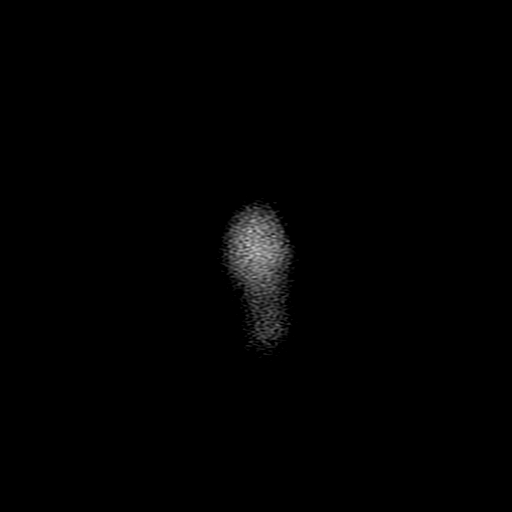

[Series 12: T1 post-contrast · axial · 3.0mm · 0.49mm/px · z∈[-92,+61]mm · 8 of 104 slices shown (1 of 2)]
[im 1/104]
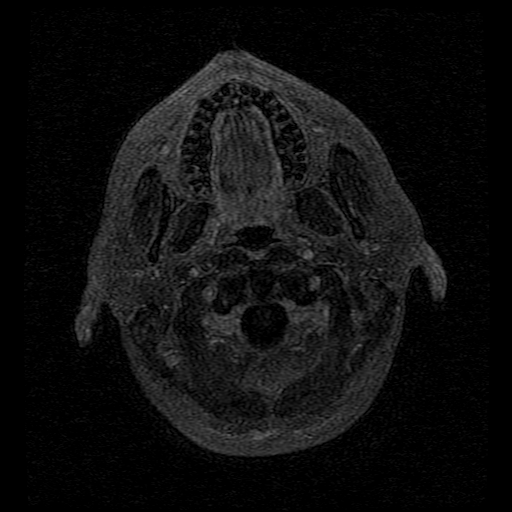
[im 15/104]
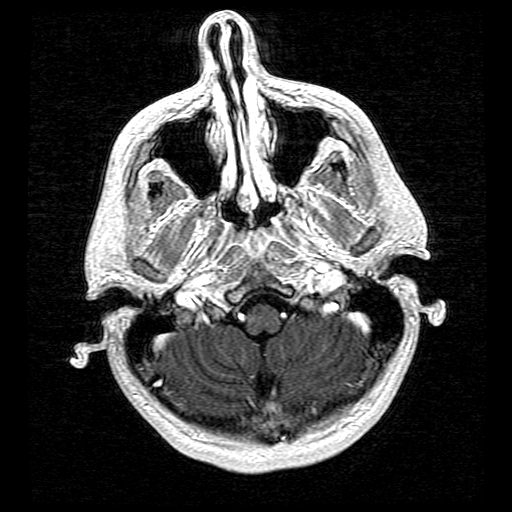
[im 30/104]
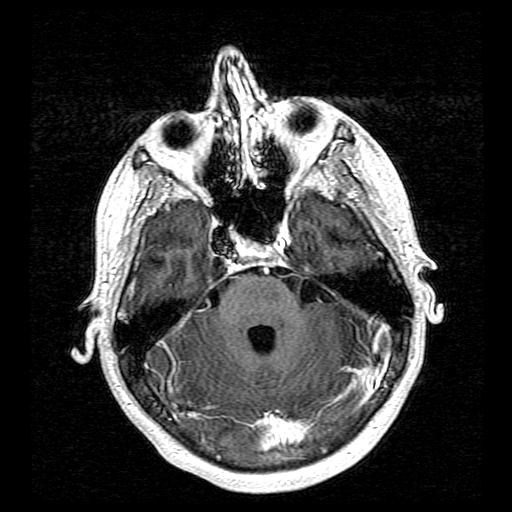
[im 45/104]
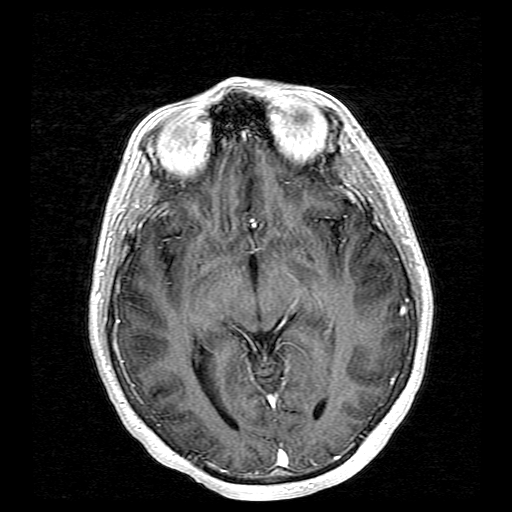
[im 59/104]
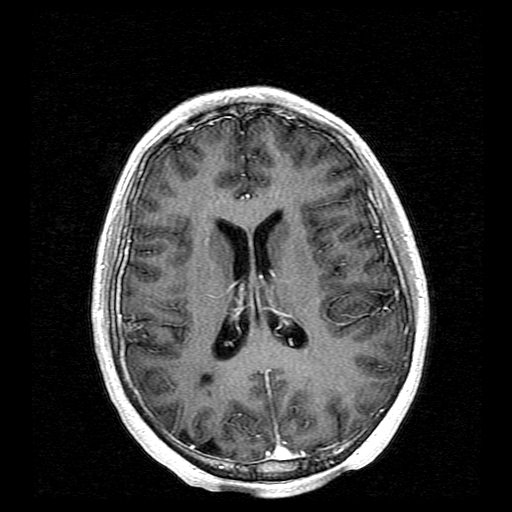
[im 74/104]
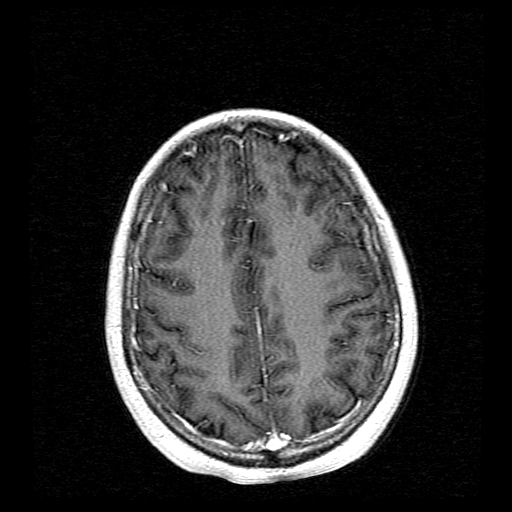
[im 89/104]
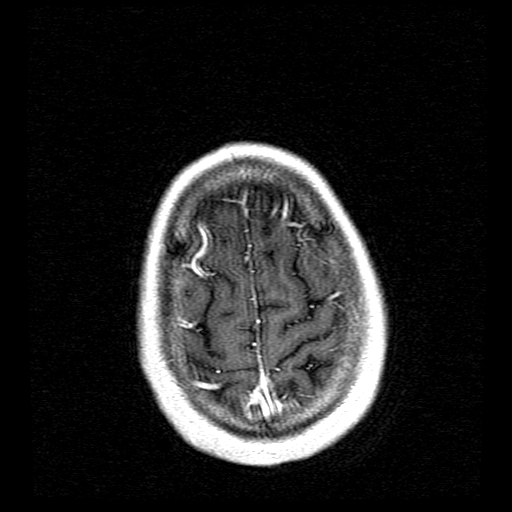
[im 104/104]
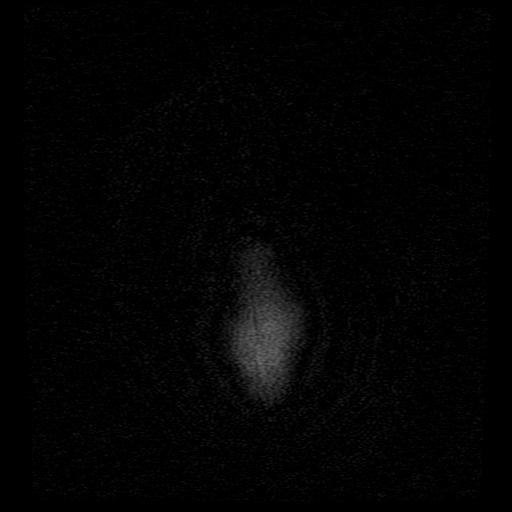

[Series 13: T1 post-contrast · coronal · 5.0mm · 0.51mm/px · 2 of 33 slices shown (2 of 2)]
[im 1/33]
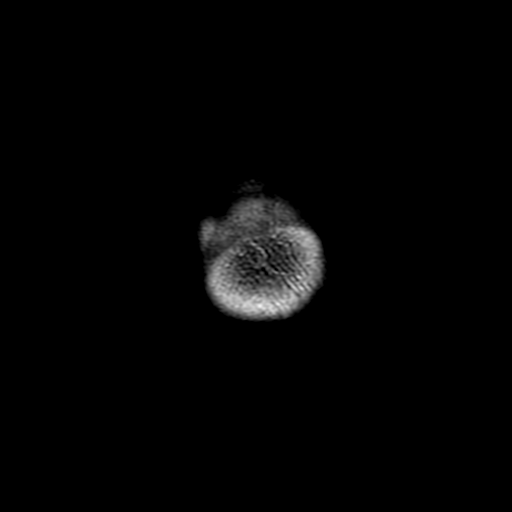
[im 33/33]
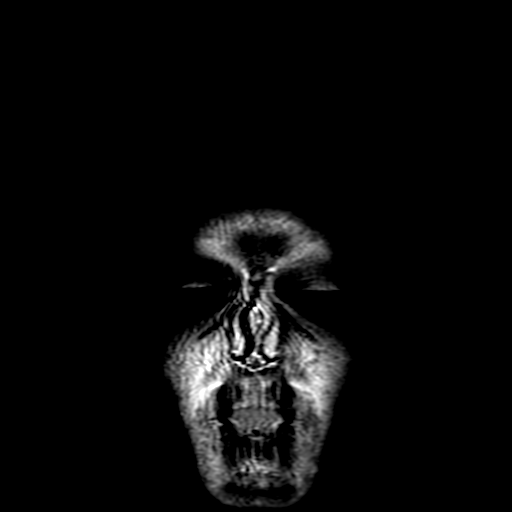

[Series 300: DWI · axial · 3.0mm · 1.09mm/px · z∈[-108,+45]mm · 4 of 52 slices shown (3 of 4)]
[im 1/52]
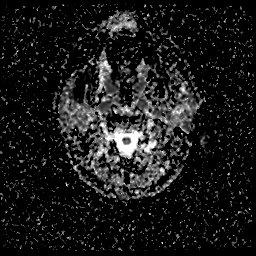
[im 18/52]
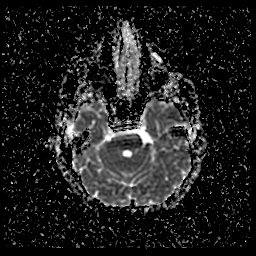
[im 35/52]
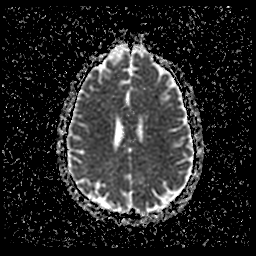
[im 52/52]
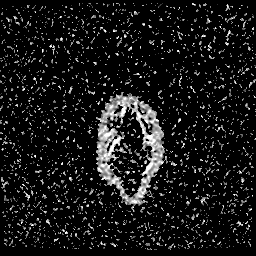

[Series 400: DWI · coronal · 5.0mm · 1.09mm/px · 3 of 40 slices shown (4 of 4)]
[im 1/40]
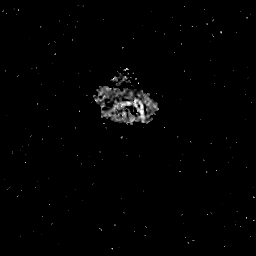
[im 20/40]
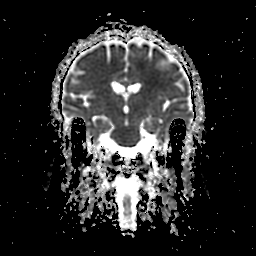
[im 40/40]
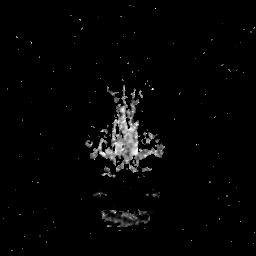

[32 of 48 positions shown; findings below may reference images not displayed]

FINDINGS: MRI HEAD FINDINGS

Brain: There is no acute infarction or intracranial hemorrhage.
Chronic right parietooccipital infarcts. Foci of susceptibility
reflecting chronic blood products are present in this region. There
is no intracranial mass, mass effect, or edema. There is no
hydrocephalus or extra-axial fluid collection. Ventricles and sulci
are within normal limits in size and configuration. No abnormal
enhancement.

Vascular: Major vessel flow voids at the skull base are preserved.
Susceptibility artifact centered in the right cavernous region.
Cavernous sinuses appear to enhance symmetrically within limitation
of the artifact on the right. No dilatation of superior ophthalmic
veins, inferior petrosal, or sphenoparietal sinuses.

Skull and upper cervical spine: Normal marrow signal is preserved.

Sinuses/Orbits: Paranasal sinuses are aerated. Orbits are
unremarkable.

Other: Sella is unremarkable.  Mastoid air cells are clear.

MRA HEAD FINDINGS

Anterior circulation: Intracranial internal carotid arteries are
patent. There is artifactual loss of flow related enhancement in the
vicinity of the coiled aneurysm. No evidence of is recurrent
aneurysm. Anterior and middle cerebral arteries are patent. Anterior
communicating artery is present.

Posterior circulation: Intracranial vertebral arteries, basilar
artery, and posterior cerebral arteries are patent. Left posterior
communicating artery is present.
IMPRESSION: No evidence of recurrent aneurysm or cavernous-carotid fistula.

Chronic right parietooccipital infarcts with foci of chronic blood
products.

## 2021-12-04 MED ORDER — GADOBUTROL 1 MMOL/ML IV SOLN
8.0000 mL | Freq: Once | INTRAVENOUS | Status: AC | PRN
  Administered 2021-12-04: 8 mL via INTRAVENOUS

## 2021-12-10 ENCOUNTER — Telehealth (HOSPITAL_COMMUNITY): Payer: Self-pay

## 2021-12-10 NOTE — Telephone Encounter (Signed)
-----   Message from Brayton El, PA-C sent at 12/10/2021  2:35 PM EDT ----- Regarding: RE: Review f/u Dev said MRI looks really good. Can go down to once a day on Brilinta 45mg  for 3 months, then stop. Cont ASA 81mg  daily. Repeat MRI/MRA in December.  Thanks  ----- Message ----- From: Sent: 12/10/2021  12:08 PM EDT To: Anderson Malta, PA-C Subject: Review f/u                                     [name],   The patient and his mom are anxious about the results of his recent mri. Please review with Dev. They are also really wanting to know if he can come off of his Brilinta. If he can d/c, please call the mom and let her know. If not, I will let them know that he has to hold on a little longer LOL.   Thanks,  12/12/2021

## 2021-12-10 NOTE — Telephone Encounter (Signed)
Pt agreed to f/u in December with MRI/MRA. AW

## 2021-12-29 ENCOUNTER — Telehealth (HOSPITAL_COMMUNITY): Payer: Self-pay

## 2021-12-29 ENCOUNTER — Telehealth: Payer: Self-pay | Admitting: Internal Medicine

## 2021-12-29 MED ORDER — TICAGRELOR 90 MG PO TABS: 45.0000 mg | ORAL_TABLET | Freq: Two times a day (BID) | ORAL | 0 refills | Status: DC

## 2021-12-29 NOTE — Progress Notes (Signed)
Received message from [facility] radiology that patient has just returned from [place] of state and left his Brilinta there. Pt request new rx be sent to [facility], [place]. Brilinta refill was sent to requested pharmacy. Pt was called and notified that rx is there for pick up.     Alex Gardener, AGNP-BC 12/29/2021, 6:01 PM

## 2021-12-29 NOTE — Telephone Encounter (Signed)
Pt left his Brilinta in [place] by accident and is needing a refill. Sent a message to our PA, Darl Pikes to call in a new on. AW

## 2022-01-01 ENCOUNTER — Telehealth (HOSPITAL_COMMUNITY): Payer: Self-pay | Admitting: Physician Assistant

## 2022-01-01 NOTE — Progress Notes (Signed)
Patient ID: Ezekiel Menzer, male   DOB: [birth year], 21 y.o.   MRN: [id number]  Received call from patient's mom, Billy Coast who was concerned regarding onset of "shooting pains" of the right temporal region beginning last night.  Pain was rated as a 3-6/10.  He had no change in baseline of gait, speech, vision, or level of function.  No additional pain anywhere else in the body.  Ms. Earlene Plater says she gave him 2 Advil and patient reported ease of symptoms and was able to go to sleep shortly thereafter.  Discussed with Dr. Corliss Skains who recommends follow up with local PCP or neurology for evaluation of symptoms.  Ms. Earlene Plater was receptive to this, and understands if symptoms intensify or become more concerning, they can obtain care from local ED.   Electronically Signed: Sheliah Plane, PA-C 01/01/2022, 10:14 AM

## 2022-01-19 DIAGNOSIS — F419 Anxiety disorder, unspecified: Secondary | ICD-10-CM | POA: Diagnosis not present

## 2022-01-19 DIAGNOSIS — F32A Depression, unspecified: Secondary | ICD-10-CM | POA: Diagnosis not present

## 2022-01-19 DIAGNOSIS — R454 Irritability and anger: Secondary | ICD-10-CM | POA: Diagnosis not present

## 2022-01-19 DIAGNOSIS — R4586 Emotional lability: Secondary | ICD-10-CM | POA: Diagnosis not present

## 2022-01-23 DIAGNOSIS — F4323 Adjustment disorder with mixed anxiety and depressed mood: Secondary | ICD-10-CM | POA: Diagnosis not present

## 2022-01-28 DIAGNOSIS — S0441XS Injury of abducent nerve, right side, sequela: Secondary | ICD-10-CM | POA: Diagnosis not present

## 2022-01-28 DIAGNOSIS — G5133 Clonic hemifacial spasm, bilateral: Secondary | ICD-10-CM | POA: Diagnosis not present

## 2022-01-28 DIAGNOSIS — R471 Dysarthria and anarthria: Secondary | ICD-10-CM | POA: Diagnosis not present

## 2022-01-28 DIAGNOSIS — H0223A Paralytic lagophthalmos right eye, upper and lower eyelids: Secondary | ICD-10-CM | POA: Diagnosis not present

## 2022-01-28 DIAGNOSIS — H532 Diplopia: Secondary | ICD-10-CM | POA: Diagnosis not present

## 2022-01-28 DIAGNOSIS — H5000 Unspecified esotropia: Secondary | ICD-10-CM | POA: Diagnosis not present

## 2022-01-28 DIAGNOSIS — G510 Bell's palsy: Secondary | ICD-10-CM | POA: Diagnosis not present

## 2022-01-29 DIAGNOSIS — F32A Depression, unspecified: Secondary | ICD-10-CM | POA: Diagnosis not present

## 2022-01-29 DIAGNOSIS — F419 Anxiety disorder, unspecified: Secondary | ICD-10-CM | POA: Diagnosis not present

## 2022-01-29 DIAGNOSIS — G4700 Insomnia, unspecified: Secondary | ICD-10-CM | POA: Diagnosis not present

## 2022-01-29 DIAGNOSIS — R454 Irritability and anger: Secondary | ICD-10-CM | POA: Diagnosis not present

## 2022-01-30 DIAGNOSIS — F4323 Adjustment disorder with mixed anxiety and depressed mood: Secondary | ICD-10-CM | POA: Diagnosis not present

## 2022-02-06 DIAGNOSIS — F4323 Adjustment disorder with mixed anxiety and depressed mood: Secondary | ICD-10-CM | POA: Diagnosis not present

## 2022-04-29 DIAGNOSIS — H532 Diplopia: Secondary | ICD-10-CM | POA: Diagnosis not present

## 2022-04-29 DIAGNOSIS — H5000 Unspecified esotropia: Secondary | ICD-10-CM | POA: Diagnosis not present

## 2022-05-07 ENCOUNTER — Other Ambulatory Visit (HOSPITAL_COMMUNITY): Payer: Self-pay | Admitting: Interventional Radiology

## 2022-05-07 DIAGNOSIS — F4323 Adjustment disorder with mixed anxiety and depressed mood: Secondary | ICD-10-CM | POA: Diagnosis not present

## 2022-05-07 DIAGNOSIS — I729 Aneurysm of unspecified site: Secondary | ICD-10-CM

## 2022-05-14 DIAGNOSIS — S60222A Contusion of left hand, initial encounter: Secondary | ICD-10-CM | POA: Diagnosis not present

## 2022-05-21 DIAGNOSIS — A6001 Herpesviral infection of penis: Secondary | ICD-10-CM | POA: Diagnosis not present

## 2022-05-26 DIAGNOSIS — H5000 Unspecified esotropia: Secondary | ICD-10-CM | POA: Diagnosis not present

## 2022-05-26 DIAGNOSIS — S0441XS Injury of abducent nerve, right side, sequela: Secondary | ICD-10-CM | POA: Diagnosis not present

## 2022-05-26 DIAGNOSIS — H532 Diplopia: Secondary | ICD-10-CM | POA: Diagnosis not present

## 2022-06-03 DIAGNOSIS — R471 Dysarthria and anarthria: Secondary | ICD-10-CM | POA: Diagnosis not present

## 2022-06-03 DIAGNOSIS — S0219XS Other fracture of base of skull, sequela: Secondary | ICD-10-CM | POA: Diagnosis not present

## 2022-06-03 DIAGNOSIS — G5131 Clonic hemifacial spasm, right: Secondary | ICD-10-CM | POA: Diagnosis not present

## 2022-06-03 DIAGNOSIS — G510 Bell's palsy: Secondary | ICD-10-CM | POA: Diagnosis not present

## 2022-06-03 DIAGNOSIS — H9190 Unspecified hearing loss, unspecified ear: Secondary | ICD-10-CM | POA: Diagnosis not present

## 2022-06-08 DIAGNOSIS — H50011 Monocular esotropia, right eye: Secondary | ICD-10-CM | POA: Diagnosis not present

## 2022-06-08 DIAGNOSIS — H499 Unspecified paralytic strabismus: Secondary | ICD-10-CM | POA: Diagnosis not present

## 2022-06-08 DIAGNOSIS — H4921 Sixth [abducent] nerve palsy, right eye: Secondary | ICD-10-CM | POA: Diagnosis not present

## 2022-06-26 ENCOUNTER — Ambulatory Visit (HOSPITAL_COMMUNITY)
Admission: RE | Admit: 2022-06-26 | Discharge: 2022-06-26 | Disposition: A | Discharge status: Home / Self Care | Payer: BC Managed Care – PPO | Source: Ambulatory Visit | Attending: Interventional Radiology | Admitting: Interventional Radiology

## 2022-06-26 DIAGNOSIS — I729 Aneurysm of unspecified site: Secondary | ICD-10-CM | POA: Diagnosis not present

## 2022-06-26 DIAGNOSIS — I771 Stricture of artery: Secondary | ICD-10-CM | POA: Diagnosis not present

## 2022-06-26 DIAGNOSIS — G9389 Other specified disorders of brain: Secondary | ICD-10-CM | POA: Diagnosis not present

## 2022-06-26 MED ORDER — GADOBUTROL 1 MMOL/ML IV SOLN
7.5000 mL | Freq: Once | INTRAVENOUS | Status: AC | PRN
  Administered 2022-06-26: 7.5 mL via INTRAVENOUS

## 2022-06-29 HISTORY — PX: IR RADIOLOGIST EVAL & MGMT: IMG5224

## 2022-07-28 DIAGNOSIS — I720 Aneurysm of carotid artery: Secondary | ICD-10-CM | POA: Diagnosis not present

## 2022-07-28 DIAGNOSIS — Z87820 Personal history of traumatic brain injury: Secondary | ICD-10-CM | POA: Diagnosis not present

## 2022-09-07 ENCOUNTER — Telehealth (HOSPITAL_COMMUNITY): Payer: Self-pay

## 2022-09-07 NOTE — Telephone Encounter (Signed)
Dr. Estanislado Pandy would like pt to remain on Aspirin until his next f/u in December. Our PA Larene Beach) explained why it is very important that he stays on the ASA. Mother agreed. I also sent her an email with the information on why it's so important to continue. She was very grateful. AB

## 2022-09-07 NOTE — Telephone Encounter (Signed)
Pt's mother called. Pt has been bugging her bc he no longer wants to take his Aspirin. Per Dr. Estanislado Pandy, he should wait until December but he is struggling with that. I have sent a message to our PA to advise. AB

## 2022-11-15 ENCOUNTER — Encounter (HOSPITAL_COMMUNITY): Payer: Self-pay

## 2022-11-15 ENCOUNTER — Ambulatory Visit (HOSPITAL_COMMUNITY)
Admission: EM | Admit: 2022-11-15 | Discharge: 2022-11-15 | Disposition: A | Discharge status: Home / Self Care | Payer: BC Managed Care – PPO | Attending: Internal Medicine | Admitting: Internal Medicine

## 2022-11-15 DIAGNOSIS — L03115 Cellulitis of right lower limb: Secondary | ICD-10-CM

## 2022-11-15 MED ORDER — DOXYCYCLINE HYCLATE 100 MG PO CAPS: 100.0000 mg | ORAL_CAPSULE | Freq: Two times a day (BID) | ORAL | 0 refills | Status: AC

## 2022-11-15 MED ORDER — MUPIROCIN 2 % EX OINT: 1.0000 | TOPICAL_OINTMENT | Freq: Three times a day (TID) | CUTANEOUS | 0 refills | Status: AC | PRN

## 2022-11-15 NOTE — Discharge Instructions (Addendum)
I have sent an antibiotic (Doxycycline) for you to take by mouth for your skin infection as well as an antibiotic ointment (Bactroban). These are often used to treat skin infections. Take them as directed.   Return in 2 to 3 days if no improvement. It is very important for you to pay attention to any new symptoms or worsening of your current condition. Please go directly to the Emergency Department immediately should you begin to feel worse in any way or have any of the following symptoms: persistent fevers, increased pain, increased swelling, increased redness, redness outside the demarcation or streaking up or down your leg.

## 2022-11-15 NOTE — ED Provider Notes (Signed)
[facility]   Note:  This document was prepared using Dragon voice recognition software and may include unintentional dictation errors.  MRN: [id number] DOB: 03/26/2001 DATE: 11/15/22   Subjective:  Chief Complaint:  Chief Complaint  Patient presents with   Insect Bite    HPI: Jason [name] is a 22 y.o. male presenting for lesion on his right lower leg posteriorly.  Patient states on Monday, he noticed a lesion on the back of his leg that was pruritic.  He continued to scratch at the lesion throughout the week.  He became concerned when he started developing redness and discomfort around the lesion 2 days ago.  He reports no discharge from the lesion or pain.  He thought he was bit by an insect, but did not see anything at the time. Denies fever, abdominal pain, nausea/vomiting, discharge, streaking. Endorses erythema and pruritus. Presents NAD.  Prior to Admission medications   Medication Sig Start Date End Date Taking? Authorizing Provider  aspirin 81 MG chewable tablet Chew 1 tablet (81 mg total) by mouth daily. 07/18/21   Hoyt Koch, PA  lidocaine (XYLOCAINE) 2 % solution Use as directed 15 mLs in the mouth or throat every 4 (four) hours as needed for mouth pain. 04/24/21   White, Elita Boone, NP  valACYclovir (VALTREX) 1000 MG tablet Take 1 tablet (1,000 mg total) by mouth 3 (three) times daily. 04/24/21   Valinda Hoar, NP     No Known Allergies  History:   History reviewed. No pertinent past medical history.   Past Surgical History:  Procedure Laterality Date   IR 3D INDEPENDENT WKST  07/16/2021   IR ANGIO EXTRACRAN SEL COM CAROTID INNOMINATE UNI BILAT MOD SED  06/08/2021   IR ANGIO INTRA EXTRACRAN SEL INTERNAL CAROTID UNI R MOD SED  06/12/2021   IR ANGIO INTRA EXTRACRAN SEL INTERNAL CAROTID UNI R MOD SED  06/18/2021   IR ANGIO VERTEBRAL SEL VERTEBRAL BILAT MOD SED  06/08/2021   IR ANGIOGRAM FOLLOW UP STUDY  07/16/2021   IR ANGIOGRAM FOLLOW UP  STUDY  07/16/2021   IR ANGIOGRAM FOLLOW UP STUDY  07/16/2021   IR ANGIOGRAM FOLLOW UP STUDY  07/16/2021   IR ANGIOGRAM FOLLOW UP STUDY  07/16/2021   IR ANGIOGRAM FOLLOW UP STUDY  07/16/2021   IR ANGIOGRAM FOLLOW UP STUDY  07/16/2021   IR ANGIOGRAM FOLLOW UP STUDY  07/16/2021   IR ANGIOGRAM FOLLOW UP STUDY  07/16/2021   IR ANGIOGRAM FOLLOW UP STUDY  07/16/2021   IR ANGIOGRAM FOLLOW UP STUDY  07/16/2021   IR ANGIOGRAM FOLLOW UP STUDY  07/16/2021   IR ANGIOGRAM FOLLOW UP STUDY  07/16/2021   IR ANGIOGRAM FOLLOW UP STUDY  07/16/2021   IR CT HEAD LTD  06/12/2021   IR CT HEAD LTD  06/18/2021   IR CT HEAD LTD  07/16/2021   IR RADIOLOGIST EVAL & MGMT  09/04/2021   IR RADIOLOGIST EVAL & MGMT  06/29/2022   IR TRANSCATH/EMBOLIZ  06/12/2021   IR TRANSCATH/EMBOLIZ  06/18/2021   IR TRANSCATH/EMBOLIZ  07/16/2021   IR US GUIDE VASC ACCESS RIGHT  06/18/2021   RADIOLOGY WITH ANESTHESIA N/A 06/08/2021   Procedure: IR WITH ANESTHESIA;  Surgeon: Julieanne Cotton, MD;  Location: [facility];  Service: Radiology;  Laterality: N/A;   RADIOLOGY WITH ANESTHESIA N/A 06/12/2021   Procedure: Alfredia Client;  Surgeon: Julieanne Cotton, MD;  Location: [facility];  Service: Radiology;  Laterality: N/A;   RADIOLOGY WITH ANESTHESIA N/A 06/18/2021  Procedure: EMBOLIZATION;  Surgeon: Julieanne Cotton, MD;  Location: [facility];  Service: Radiology;  Laterality: N/A;   RADIOLOGY WITH ANESTHESIA N/A 07/16/2021   Procedure: RADIOLOGY WITH ANESTHESIA ,EMBOLIZATION;  Surgeon: Julieanne Cotton, MD;  Location: [facility];  Service: Radiology;  Laterality: N/A;    Family History  Problem Relation Age of Onset   Diabetes Maternal Grandfather    Cancer Maternal Grandfather    Healthy Mother    Healthy Father     Social History   Tobacco Use   Smoking status: Former    Packs/day: .25    Types: Cigarettes   Smokeless tobacco: Never  Vaping Use   Vaping Use: Every day   Substances: Nicotine, Flavoring  Substance Use Topics   Alcohol use: Yes   Drug use: Never     Review of Systems  Constitutional:  Negative for fever.  Respiratory:  Negative for shortness of breath.   Cardiovascular:  Negative for leg swelling.  Gastrointestinal:  Negative for abdominal pain, nausea and vomiting.  Musculoskeletal:  Negative for arthralgias.  Skin:  Positive for rash.  Neurological:  Negative for headaches.     Objective:   Vitals: BP 118/71 (BP Location: Right Arm)   Pulse 66   Temp 98.1 F (36.7 C) (Oral)   Resp 14   SpO2 97%   Physical Exam Constitutional:      General: He is not in acute distress.    Appearance: Normal appearance. He is well-developed and overweight. He is not ill-appearing or toxic-appearing.  HENT:     Head: Normocephalic and atraumatic.  Cardiovascular:     Rate and Rhythm: Normal rate and regular rhythm.     Heart sounds: Normal heart sounds.  Pulmonary:     Effort: Pulmonary effort is normal.     Breath sounds: Normal breath sounds.     Comments: Clear to auscultation bilaterally  Abdominal:     General: Bowel sounds are normal.     Palpations: Abdomen is soft.     Tenderness: There is no abdominal tenderness.  Musculoskeletal:     Right lower leg: No swelling or tenderness. No edema.     Left lower leg: No swelling or tenderness. No edema.     Comments: Negative Homans' sign bilaterally  Skin:    General: Skin is warm and dry.     Findings: Lesion present.          Comments: Patient has lesion on right lower extremity posteriorly.  The lesion is approximately 3 cm in size with erythematous base and crusting at the center.  No warmth or discharge currently.  Nontender to palpation.  Neurological:     General: No focal deficit present.     Mental Status: He is alert.  Psychiatric:        Mood and Affect: Mood and affect normal.     Results:  Labs: No results found for this or any previous visit (from the past 24 hour(s)).  Radiology: No results found.   UC Course/Treatments:  Procedures: Procedures    Medications Ordered in UC: Medications - No data to display   Assessment and Plan :     ICD-10-CM   1. Cellulitis of right lower leg  L03.115      Cellulitis of right lower leg: Afebrile, nontoxic-appearing, NAD. VSS. DDX includes but not limited to: Cellulitis, abscess, DVT On PE, lesion was nonfluctuant and had no discharge currently.  Doxycycline 100 mg twice daily and Mupirocin 3 times daily for  prescribed given erythema and crusting. Strict ED precautions were given and patient verbalized understanding.   ED Discharge Orders          Ordered    doxycycline (VIBRAMYCIN) 100 MG capsule  2 times daily        11/15/22 1542    mupirocin ointment (BACTROBAN) 2 %  3 times daily PRN        11/15/22 1542             PDMP not reviewed this encounter.     Zalman Hull P, PA-C 11/15/22 1552

## 2022-11-15 NOTE — ED Triage Notes (Addendum)
Patient states he had a possible spider bite to the right calf. Area around the bite mark is red and slightly swollen.  Patient has not put any creams, lotions, or taken any po meds.

## 2022-12-04 DIAGNOSIS — R051 Acute cough: Secondary | ICD-10-CM | POA: Diagnosis not present

## 2022-12-04 DIAGNOSIS — J069 Acute upper respiratory infection, unspecified: Secondary | ICD-10-CM | POA: Diagnosis not present

## 2022-12-14 DIAGNOSIS — S0441XS Injury of abducent nerve, right side, sequela: Secondary | ICD-10-CM | POA: Diagnosis not present

## 2022-12-14 DIAGNOSIS — S069X9S Unspecified intracranial injury with loss of consciousness of unspecified duration, sequela: Secondary | ICD-10-CM | POA: Diagnosis not present

## 2022-12-14 DIAGNOSIS — H532 Diplopia: Secondary | ICD-10-CM | POA: Diagnosis not present

## 2022-12-14 DIAGNOSIS — Z9889 Other specified postprocedural states: Secondary | ICD-10-CM | POA: Diagnosis not present

## 2022-12-21 DIAGNOSIS — J0300 Acute streptococcal tonsillitis, unspecified: Secondary | ICD-10-CM | POA: Diagnosis not present

## 2022-12-26 ENCOUNTER — Emergency Department (HOSPITAL_COMMUNITY)
Admission: EM | Admit: 2022-12-26 | Discharge: 2022-12-26 | Disposition: A | Discharge status: Home / Self Care | Payer: BC Managed Care – PPO | Attending: Emergency Medicine | Admitting: Emergency Medicine

## 2022-12-26 ENCOUNTER — Other Ambulatory Visit: Payer: Self-pay

## 2022-12-26 ENCOUNTER — Emergency Department (HOSPITAL_COMMUNITY): Payer: BC Managed Care – PPO

## 2022-12-26 DIAGNOSIS — Z7982 Long term (current) use of aspirin: Secondary | ICD-10-CM | POA: Diagnosis not present

## 2022-12-26 DIAGNOSIS — R519 Headache, unspecified: Secondary | ICD-10-CM | POA: Insufficient documentation

## 2022-12-26 LAB — BASIC METABOLIC PANEL
Anion gap: 11 (ref 5–15)
BUN: 16 mg/dL (ref 6–20)
CO2: 24 mmol/L (ref 22–32)
Calcium: 8.9 mg/dL (ref 8.9–10.3)
Chloride: 106 mmol/L (ref 98–111)
Creatinine, Ser: 0.88 mg/dL (ref 0.61–1.24)
GFR, Estimated: >60 mL/min (ref >60)
Glucose, Bld: 102 mg/dL — ABNORMAL HIGH (ref 70–99)
Potassium: 3.9 mmol/L (ref 3.5–5.1)
Sodium: 141 mmol/L (ref 135–145)

## 2022-12-26 LAB — CBC
HCT: 41.9 % (ref 39.0–52.0)
Hemoglobin: 14.2 g/dL (ref 13.0–17.0)
MCH: 30 pg (ref 26.0–34.0)
MCHC: 33.9 g/dL (ref 30.0–36.0)
MCV: 88.4 fL (ref 80.0–100.0)
Platelets: 341 10*3/uL (ref 150–400)
RBC: 4.74 MIL/uL (ref 4.22–5.81)
RDW: 12.2 % (ref 11.5–15.5)
WBC: 9.4 10*3/uL (ref 4.0–10.5)
nRBC: 0 % (ref 0.0–0.2)

## 2022-12-26 MED ORDER — MAGNESIUM SULFATE 2 GM/50ML IV SOLN
2.0000 g | Freq: Once | INTRAVENOUS | Status: AC
  Administered 2022-12-26: 2 g via INTRAVENOUS
  Filled 2022-12-26: qty 50

## 2022-12-26 MED ORDER — TETRACAINE HCL 0.5 % OP SOLN
2.0000 [drp] | Freq: Once | OPHTHALMIC | Status: AC
  Administered 2022-12-26: 2 [drp] via OPHTHALMIC
  Filled 2022-12-26: qty 4

## 2022-12-26 MED ORDER — DEXAMETHASONE SODIUM PHOSPHATE 4 MG/ML IJ SOLN
4.0000 mg | Freq: Once | INTRAMUSCULAR | Status: AC
  Administered 2022-12-26: 4 mg via INTRAVENOUS
  Filled 2022-12-26: qty 1

## 2022-12-26 MED ORDER — LACTATED RINGERS IV BOLUS
1000.0000 mL | Freq: Once | INTRAVENOUS | Status: AC
  Administered 2022-12-26: 1000 mL via INTRAVENOUS

## 2022-12-26 MED ORDER — METOCLOPRAMIDE HCL 5 MG/ML IJ SOLN
10.0000 mg | Freq: Once | INTRAMUSCULAR | Status: AC
  Administered 2022-12-26: 10 mg via INTRAVENOUS
  Filled 2022-12-26: qty 2

## 2022-12-26 MED ORDER — DIPHENHYDRAMINE HCL 25 MG PO CAPS
25.0000 mg | ORAL_CAPSULE | Freq: Once | ORAL | Status: AC
  Administered 2022-12-26: 25 mg via ORAL
  Filled 2022-12-26: qty 1

## 2022-12-26 MED ORDER — IOHEXOL 350 MG/ML SOLN
75.0000 mL | Freq: Once | INTRAVENOUS | Status: AC | PRN
  Administered 2022-12-26: 75 mL via INTRAVENOUS

## 2022-12-26 MED ORDER — METOCLOPRAMIDE HCL 10 MG PO TABS: 10.0000 mg | ORAL_TABLET | Freq: Four times a day (QID) | ORAL | 0 refills | Status: DC

## 2022-12-26 NOTE — ED Provider Notes (Incomplete)
[place] EMERGENCY DEPARTMENT AT [facility] Provider Note   CSN: [id number] Arrival date & time: 12/26/22  0402     History {Add pertinent medical, surgical, social history, OB history to HPI:1} Chief Complaint  Patient presents with  . Headache    Jason [name] is a 22 y.o. male.   Headache        Home Medications Prior to Admission medications   Medication Sig Start Date End Date Taking? Authorizing Provider  aspirin 81 MG chewable tablet Chew 1 tablet (81 mg total) by mouth daily. 07/18/21   Hoyt Koch, PA  lidocaine (XYLOCAINE) 2 % solution Use as directed 15 mLs in the mouth or throat every 4 (four) hours as needed for mouth pain. 04/24/21   White, Elita Boone, NP  valACYclovir (VALTREX) 1000 MG tablet Take 1 tablet (1,000 mg total) by mouth 3 (three) times daily. 04/24/21   Valinda Hoar, NP      Allergies    Patient has no known allergies.    Review of Systems   Review of Systems  Neurological:  Positive for headaches.    Physical Exam Updated Vital Signs BP (!) 133/92   Pulse 71   Temp 98.1 F (36.7 C)   Resp 18   Ht 5\' 11"  (1.803 m)   Wt 83.9 kg   SpO2 100%   BMI 25.80 kg/m  Physical Exam Vitals and nursing note reviewed.  Constitutional:      General: He is not in acute distress. HENT:     Head: Normocephalic and atraumatic.     Nose: Nose normal.     Mouth/Throat:     Mouth: Mucous membranes are moist.  Eyes:     General: No scleral icterus.    Comments: R eye pressures 10, 12, 12 all w 95% CI  R eye does not cross midline (baseline) L eye EOMI  PERRLA  Cardiovascular:     Rate and Rhythm: Normal rate and regular rhythm.     Pulses: Normal pulses.     Heart sounds: Normal heart sounds.  Pulmonary:     Effort: Pulmonary effort is normal. No respiratory distress.     Breath sounds: No wheezing.  Abdominal:     Palpations: Abdomen is soft.     Tenderness: There is no abdominal tenderness.  There is no guarding or rebound.  Musculoskeletal:     Cervical back: Normal range of motion.     Right lower leg: No edema.     Left lower leg: No edema.  Skin:    General: Skin is warm and dry.     Capillary Refill: Capillary refill takes less than 2 seconds.  Neurological:     Mental Status: He is alert. Mental status is at baseline.     Comments: Alert and oriented to self, place, time and event.   Speech is fluent, clear without dysarthria or dysphasia.   Strength 5/5 in upper/lower extremities   Sensation intact in upper/lower extremities   Normal gait.  CN I not tested  CN II grossly intact visual fields bilaterally. Did not visualize posterior eye.  CN III, IV, VI PERRLA and EOMs intact bilaterally  CN V Intact sensation to sharp and light touch to the face  CN VII facial movements symmetric  CN VIII not tested  CN IX, X no uvula deviation, symmetric rise of soft palate  CN XI 5/5 SCM and trapezius strength bilaterally  CN XII Midline tongue protrusion, symmetric  L/R movements   Psychiatric:        Mood and Affect: Mood normal.        Behavior: Behavior normal.     ED Results / Procedures / Treatments   Labs (all labs ordered are listed, but only abnormal results are displayed) Labs Reviewed  CBC  BASIC METABOLIC PANEL    EKG None  Radiology No results found.  Procedures Procedures  {Document cardiac monitor, telemetry assessment procedure when appropriate:1}  Medications Ordered in ED Medications - No data to display  ED Course/ Medical Decision Making/ A&P Clinical Course as of 12/26/22 0631  Sat Dec 26, 2022  0452 ~48 HOURS of R sided headache.  Hx of pseudoaneurysm  [WF]  0614 CTA head/neck unremarkable IMPRESSION: 1. Stable CTA Head and Neck since last year: Stable and satisfactory post endovascular appearance of the distal Right ICA; large cavernous segment/cavernous sinus embolization coil pack with underlying ICA siphon stent.  2. No  other arterial abnormality in the head or neck.  3. Stable mild Right MCA territory encephalomalacia. No new intracranial abnormality.   [WF]    Clinical Course User Index [WF] Gailen Shelter, PA   {   Click here for ABCD2, HEART and other calculatorsREFRESH Note before signing :1}                          Medical Decision Making Amount and/or Complexity of Data Reviewed Labs: ordered. Radiology: ordered.  Risk Prescription drug management.        Final Clinical Impression(s) / ED Diagnoses Final diagnoses:  Acute nonintractable headache, unspecified headache type    Rx / DC Orders ED Discharge Orders     None

## 2022-12-26 NOTE — Discharge Instructions (Addendum)
Please follow-up with your primary care provider.  Make sure you are drinking plenty of water, refrain from alcohol, smoking, loud noises, bright lights.  I have given you a few doses of Reglan which is the medicine I gave you for headaches to go home with.  Use as needed as prescribed.  You may always return to the emergency room for any new or concerning symptoms.
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
## 2022-12-26 NOTE — ED Notes (Signed)
Pt requesting medication before proceeding with visual acuity. Notified nurse

## 2022-12-27 ENCOUNTER — Other Ambulatory Visit: Payer: Self-pay

## 2022-12-27 ENCOUNTER — Emergency Department (HOSPITAL_COMMUNITY): Payer: BC Managed Care – PPO

## 2022-12-27 ENCOUNTER — Encounter (HOSPITAL_COMMUNITY): Payer: Self-pay

## 2022-12-27 ENCOUNTER — Emergency Department (HOSPITAL_COMMUNITY)
Admission: EM | Admit: 2022-12-27 | Discharge: 2022-12-27 | Disposition: A | Discharge status: Home / Self Care | Payer: BC Managed Care – PPO | Attending: Emergency Medicine | Admitting: Emergency Medicine

## 2022-12-27 DIAGNOSIS — R519 Headache, unspecified: Secondary | ICD-10-CM | POA: Diagnosis not present

## 2022-12-27 DIAGNOSIS — Z7982 Long term (current) use of aspirin: Secondary | ICD-10-CM | POA: Diagnosis not present

## 2022-12-27 MED ORDER — GADOBUTROL 1 MMOL/ML IV SOLN
8.0000 mL | Freq: Once | INTRAVENOUS | Status: AC | PRN
  Administered 2022-12-27: 8 mL via INTRAVENOUS

## 2022-12-27 MED ORDER — DEXAMETHASONE SODIUM PHOSPHATE 4 MG/ML IJ SOLN
4.0000 mg | Freq: Once | INTRAMUSCULAR | Status: AC
  Administered 2022-12-27: 4 mg via INTRAVENOUS
  Filled 2022-12-27: qty 1

## 2022-12-27 MED ORDER — SODIUM CHLORIDE 0.9 % IV BOLUS
1000.0000 mL | Freq: Once | INTRAVENOUS | Status: AC
  Administered 2022-12-27: 1000 mL via INTRAVENOUS

## 2022-12-27 MED ORDER — DIPHENHYDRAMINE HCL 50 MG/ML IJ SOLN
25.0000 mg | Freq: Once | INTRAMUSCULAR | Status: AC
  Administered 2022-12-27: 25 mg via INTRAVENOUS
  Filled 2022-12-27: qty 1

## 2022-12-27 MED ORDER — LORAZEPAM 0.5 MG PO TABS
0.5000 mg | ORAL_TABLET | Freq: Once | ORAL | Status: DC | PRN
  Filled 2022-12-27: qty 1

## 2022-12-27 MED ORDER — PROCHLORPERAZINE EDISYLATE 10 MG/2ML IJ SOLN
10.0000 mg | Freq: Once | INTRAMUSCULAR | Status: AC
  Administered 2022-12-27: 10 mg via INTRAVENOUS
  Filled 2022-12-27: qty 2
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
## 2022-12-27 NOTE — Discharge Instructions (Addendum)
The hospital neurologist advised Tylenol 650 mg every 6 hours and Benadryl 25 mg every 6 hours for headache.  A referral has been placed to for neurology for follow-up.  A message was sent to Dr. Eustace Quail asking him to review your MRI.  Increase your water consumption to help with headaches.

## 2022-12-27 NOTE — ED Triage Notes (Signed)
Pt came in via POV d/t HA on the Rt side, this HA has been consistent for the past 4 days & d/t his Hx of brain surgeries he is concerned that the pain has not subsided or had any relief with OTC Tx. A/Ox4, 9/10 pain while in triage.
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
## 2022-12-27 NOTE — ED Notes (Signed)
Transfer of care report given to RN, Angelica Chessman.

## 2022-12-31 ENCOUNTER — Ambulatory Visit: Payer: BC Managed Care – PPO | Admitting: Neurology

## 2022-12-31 ENCOUNTER — Encounter: Payer: Self-pay | Admitting: Neurology

## 2023-02-17 DIAGNOSIS — H532 Diplopia: Secondary | ICD-10-CM | POA: Diagnosis not present

## 2023-02-17 DIAGNOSIS — F17290 Nicotine dependence, other tobacco product, uncomplicated: Secondary | ICD-10-CM | POA: Diagnosis not present

## 2023-02-17 DIAGNOSIS — H509 Unspecified strabismus: Secondary | ICD-10-CM | POA: Diagnosis not present

## 2023-02-17 DIAGNOSIS — Z7982 Long term (current) use of aspirin: Secondary | ICD-10-CM | POA: Diagnosis not present

## 2023-02-17 DIAGNOSIS — H4921 Sixth [abducent] nerve palsy, right eye: Secondary | ICD-10-CM | POA: Diagnosis not present

## 2023-02-17 DIAGNOSIS — H50011 Monocular esotropia, right eye: Secondary | ICD-10-CM | POA: Diagnosis not present

## 2023-02-17 DIAGNOSIS — X58XXXA Exposure to other specified factors, initial encounter: Secondary | ICD-10-CM | POA: Diagnosis not present

## 2023-02-17 DIAGNOSIS — H5000 Unspecified esotropia: Secondary | ICD-10-CM | POA: Diagnosis not present

## 2023-02-17 DIAGNOSIS — S069XAA Unspecified intracranial injury with loss of consciousness status unknown, initial encounter: Secondary | ICD-10-CM | POA: Diagnosis not present

## 2023-02-17 DIAGNOSIS — H4922 Sixth [abducent] nerve palsy, left eye: Secondary | ICD-10-CM | POA: Diagnosis not present

## 2023-02-17 DIAGNOSIS — Z9889 Other specified postprocedural states: Secondary | ICD-10-CM | POA: Diagnosis not present

## 2023-02-17 DIAGNOSIS — S069X0A Unspecified intracranial injury without loss of consciousness, initial encounter: Secondary | ICD-10-CM | POA: Diagnosis not present

## 2023-04-19 ENCOUNTER — Encounter: Payer: Self-pay | Admitting: Emergency Medicine

## 2023-04-19 ENCOUNTER — Ambulatory Visit
Admission: EM | Admit: 2023-04-19 | Discharge: 2023-04-19 | Disposition: A | Discharge status: Home / Self Care | Payer: BC Managed Care – PPO | Attending: Emergency Medicine | Admitting: Emergency Medicine

## 2023-04-19 DIAGNOSIS — M545 Low back pain, unspecified: Secondary | ICD-10-CM

## 2023-04-19 DIAGNOSIS — B009 Herpesviral infection, unspecified: Secondary | ICD-10-CM | POA: Diagnosis not present

## 2023-04-19 MED ORDER — VALACYCLOVIR HCL 1 G PO TABS: 1000.0000 mg | ORAL_TABLET | Freq: Every day | ORAL | 0 refills | Status: DC

## 2023-04-19 NOTE — Discharge Instructions (Addendum)
Valtrex has been refilled, 1 tablet daily for 5 days, you have been given extra medicine that you may use for further outbreaks  Your pain is most likely caused by irritation to the muscles.  You may take Tylenol every 6 hours as needed for pain  You may also use topical medicine such as diclofenac gel, lidocaine gel or patches etc.  You may use heating pad in 15 minute intervals as needed for additional comfort, within the first 2-3 days you may find comfort in using ice in 10-15 minutes over affected area  Begin stretching affected area daily for 10 minutes as tolerated to further loosen muscles   When lying down place pillow underneath and between knees for support  Can try sleeping without pillow on firm mattress   Practice good posture: head back, shoulders back, chest forward, pelvis back and weight distributed evenly on both legs  If pain persist after recommended treatment or reoccurs if may be beneficial to follow up with orthopedic specialist for evaluation, this doctor specializes in the bones and can manage your symptoms long-term with options such as but not limited to imaging, medications or physical therapy   Cut on  your finger is healing appropriately, it is reopening due to the placement being in the bend of your finger, at this time there are no signs of infection, continue to monitor and wound should heal with time

## 2023-04-19 NOTE — ED Triage Notes (Signed)
Pt presents with right side back pain that started today. Pt states he lifted a pack of water bottles.   Pt needs a refill on Valtrex, he reports a current outbreak.

## 2023-04-19 NOTE — ED Provider Notes (Signed)
Jason [name]    CSN: [id number] Arrival date & time: 04/19/23  1803      History   Chief Complaint Chief Complaint  Patient presents with   Medication Refill   Back Pain    HPI Jason [name] is a 22 y.o. male.   Patient presents for evaluation of right-sided low back pain beginning today after lifting a container of water.  Pain occurring intermittently exacerbated by moving such as lifting the arms above the head and leaning towards the left side, feels as if her muscles being pulled.  Endorses that he did complete a workout today that included movements of the latissimus dorsi.  Pain does not radiate.  Has not attempted treatment.  Denies numbness or tingling, urinary or bowel incontinence.  Pain is not occurred before.  Requesting refill on Valtrex. has had genital outbreak present for 2 days.  Known history of herpes.    History reviewed. No pertinent past medical history.  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   Basilar skull fracture (HCC) 06/08/2021    Past Surgical History:  Procedure Laterality Date   IR 3D INDEPENDENT WKST  07/16/2021   IR ANGIO EXTRACRAN SEL COM CAROTID INNOMINATE UNI BILAT MOD SED  06/08/2021   IR ANGIO INTRA EXTRACRAN SEL INTERNAL CAROTID UNI R MOD SED  06/12/2021   IR ANGIO INTRA EXTRACRAN SEL INTERNAL CAROTID UNI R MOD SED  06/18/2021   IR ANGIO VERTEBRAL SEL VERTEBRAL BILAT MOD SED  06/08/2021   IR ANGIOGRAM FOLLOW UP STUDY  07/16/2021   IR ANGIOGRAM FOLLOW UP STUDY  07/16/2021   IR ANGIOGRAM FOLLOW UP STUDY  07/16/2021   IR ANGIOGRAM FOLLOW UP STUDY  07/16/2021   IR ANGIOGRAM FOLLOW UP STUDY  07/16/2021   IR ANGIOGRAM FOLLOW UP STUDY  07/16/2021   IR ANGIOGRAM FOLLOW  UP STUDY  07/16/2021   IR ANGIOGRAM FOLLOW UP STUDY  07/16/2021   IR ANGIOGRAM FOLLOW UP STUDY  07/16/2021   IR ANGIOGRAM FOLLOW UP STUDY  07/16/2021   IR ANGIOGRAM FOLLOW UP STUDY  07/16/2021   IR ANGIOGRAM FOLLOW UP STUDY  07/16/2021   IR ANGIOGRAM FOLLOW UP STUDY  07/16/2021   IR ANGIOGRAM FOLLOW UP STUDY  07/16/2021   IR CT HEAD LTD  06/12/2021   IR CT HEAD LTD  06/18/2021   IR CT HEAD LTD  07/16/2021   IR RADIOLOGIST EVAL & MGMT  09/04/2021   IR RADIOLOGIST EVAL & MGMT  06/29/2022   IR TRANSCATH/EMBOLIZ  06/12/2021   IR TRANSCATH/EMBOLIZ  06/18/2021   IR TRANSCATH/EMBOLIZ  07/16/2021   IR US GUIDE VASC ACCESS RIGHT  06/18/2021   RADIOLOGY WITH ANESTHESIA N/A 06/08/2021   Procedure: IR WITH ANESTHESIA;  Surgeon: Julieanne Cotton, MD;  Location: [facility] OR;  Service: Radiology;  Laterality: N/A;   RADIOLOGY WITH ANESTHESIA N/A 06/12/2021   Procedure: Alfredia Client;  Surgeon: Julieanne Cotton, MD;  Location: [facility] OR;  Service: Radiology;  Laterality: N/A;   RADIOLOGY WITH ANESTHESIA N/A 06/18/2021   Procedure: EMBOLIZATION;  Surgeon: Julieanne Cotton, MD;  Location: [facility] OR;  Service: Radiology;  Laterality: N/A;   RADIOLOGY WITH ANESTHESIA N/A 07/16/2021   Procedure: RADIOLOGY WITH ANESTHESIA ,EMBOLIZATION;  Surgeon: Julieanne Cotton, MD;  Location: [facility]  OR;  Service: Radiology;  Laterality: N/A;       Home Medications    Prior to Admission medications   Medication Sig Start Date End Date Taking? Authorizing Provider  amoxicillin (AMOXIL) 875 MG tablet Take 875 mg by mouth 2 (two) times daily. 12/21/22   [provider]  aspirin 81 MG chewable tablet Chew 1 tablet (81 mg total) by mouth daily. 07/18/21   Hoyt Koch, PA  lidocaine (XYLOCAINE) 2 % solution Use as directed 15 mLs in the mouth or throat every 4 (four) hours as needed for mouth pain. Patient not taking: Reported on 12/27/2022 04/24/21   Valinda Hoar, NP  metoCLOPramide (REGLAN) 10 MG tablet Take 1 tablet (10 mg total) by  mouth every 6 (six) hours. 12/26/22   Gailen Shelter, PA  valACYclovir (VALTREX) 1000 MG tablet Take 1 tablet (1,000 mg total) by mouth daily. 04/19/23   Valinda Hoar, NP    Family History Family History  Problem Relation Age of Onset   Diabetes Maternal Grandfather    Cancer Maternal Grandfather    Healthy Mother    Healthy Father     Social History Social History   Tobacco Use   Smoking status: Former    Current packs/day: 0.25    Types: Cigarettes   Smokeless tobacco: Never  Vaping Use   Vaping status: Every Day   Substances: Nicotine, Flavoring  Substance Use Topics   Alcohol use: Yes   Drug use: Never     Allergies   Ibuprofen   Review of Systems Review of Systems   Physical Exam Triage Vital Signs ED Triage Vitals  Encounter Vitals Group     BP 04/19/23 1822 127/79     Systolic BP Percentile --      Diastolic BP Percentile --      Pulse Rate 04/19/23 1822 (!) 57     Resp 04/19/23 1822 18     Temp 04/19/23 1822 98.4 F (36.9 C)     Temp Source 04/19/23 1822 Oral     SpO2 04/19/23 1822 98 %     Weight --      Height --      Head Circumference --      Peak Flow --      Pain Score 04/19/23 1821 2     Pain Loc --      Pain Education --      Exclude from Growth Chart --    No data found.  Updated Vital Signs BP 127/79 (BP Location: Right Arm)   Pulse (!) 57   Temp 98.4 F (36.9 C) (Oral)   Resp 18   SpO2 98%   Visual Acuity Right Eye Distance:   Left Eye Distance:   Bilateral Distance:    Right Eye Near:   Left Eye Near:    Bilateral Near:     Physical Exam Constitutional:      Appearance: Normal appearance.  Eyes:     Extraocular Movements: Extraocular movements intact.  Pulmonary:     Effort: Pulmonary effort is normal.  Genitourinary:    Comments: deferred Musculoskeletal:     Comments: Tenderness present to the right latissimus dorsi without ecchymosis swelling or deformity, no spinal tenderness noted on exam, has full  range of motion but pain is elicited when turning towards the left side, strength is a 5 out of 5, negative straight leg test  Neurological:     Mental Status: He is alert and  oriented to person, place, and time. Mental status is at baseline.      UC Treatments / Results  Labs (all labs ordered are listed, but only abnormal results are displayed) Labs Reviewed - No data to display  EKG   Radiology No results found.  Procedures Procedures (including critical care time)  Medications Ordered in UC Medications - No data to display  Initial Impression / Assessment and Plan / UC Course  I have reviewed the triage vital signs and the nursing notes.  Pertinent labs & imaging results that were available during my care of the patient were reviewed by me and considered in my medical decision making (see chart for details).  Acute right-sided low back pain without sciatica, herpes simplex  Etiology of back pain most likely muscular, low suspicion for spinal involvement therefore imaging deferred, low suspicion for spinal anesthesia, has not attempted treatment, unable to use NSAIDs due to history of a subarachnoid hemorrhage, would like to use supportive care before attempting prescription medicine, reviewed Tylenol and topical medications as well as RICE, heat massage stretching with activity as tolerated  Valtrex refilled and discussed outbreak treatment, may follow-up with urgent care as needed Final Clinical Impressions(s) / UC Diagnoses   Final diagnoses:  Acute right-sided low back pain without sciatica  Herpes simplex     Discharge Instructions      Valtrex has been refilled, 1 tablet daily for 5 days, you have been given extra medicine that you may use for further outbreaks  Your pain is most likely caused by irritation to the muscles.  You may take Tylenol every 6 hours as needed for pain  You may also use topical medicine such as diclofenac gel, lidocaine gel or  patches etc.  You may use heating pad in 15 minute intervals as needed for additional comfort, within the first 2-3 days you may find comfort in using ice in 10-15 minutes over affected area  Begin stretching affected area daily for 10 minutes as tolerated to further loosen muscles   When lying down place pillow underneath and between knees for support  Can try sleeping without pillow on firm mattress   Practice good posture: head back, shoulders back, chest forward, pelvis back and weight distributed evenly on both legs  If pain persist after recommended treatment or reoccurs if may be beneficial to follow up with orthopedic specialist for evaluation, this doctor specializes in the bones and can manage your symptoms long-term with options such as but not limited to imaging, medications or physical therapy   Cut on  your finger is healing appropriately, it is reopening due to the placement being in the bend of your finger, at this time there are no signs of infection, continue to monitor and wound should heal with time     ED Prescriptions     Medication Sig Dispense Auth. Provider   valACYclovir (VALTREX) 1000 MG tablet Take 1 tablet (1,000 mg total) by mouth daily. 30 tablet Valinda Hoar, NP      PDMP not reviewed this encounter.   Valinda Hoar, NP 04/19/23 1907

## 2023-05-12 ENCOUNTER — Encounter (HOSPITAL_COMMUNITY): Payer: Self-pay | Admitting: Emergency Medicine

## 2023-05-12 ENCOUNTER — Ambulatory Visit (HOSPITAL_COMMUNITY)
Admission: EM | Admit: 2023-05-12 | Discharge: 2023-05-12 | Disposition: A | Discharge status: Home / Self Care | Payer: BC Managed Care – PPO | Attending: Emergency Medicine | Admitting: Emergency Medicine

## 2023-05-12 ENCOUNTER — Ambulatory Visit (INDEPENDENT_AMBULATORY_CARE_PROVIDER_SITE_OTHER): Payer: BC Managed Care – PPO

## 2023-05-12 DIAGNOSIS — S93509A Unspecified sprain of unspecified toe(s), initial encounter: Secondary | ICD-10-CM

## 2023-05-12 DIAGNOSIS — S93505A Unspecified sprain of left lesser toe(s), initial encounter: Secondary | ICD-10-CM | POA: Diagnosis not present

## 2023-05-12 DIAGNOSIS — M79672 Pain in left foot: Secondary | ICD-10-CM | POA: Diagnosis not present

## 2023-05-12 NOTE — Discharge Instructions (Signed)
Wear the postoperative shoe as needed for comfort.  Ice, elevate, relative rest.  There was no fracture seen on x-ray.  1000 mg of Tylenol 3 times a day.  Follow-up with [facility] if not better in a week.

## 2023-05-12 NOTE — ED Notes (Signed)
Pt tried on post-op shoe and stated it did not help and refused

## 2023-05-12 NOTE — ED Triage Notes (Signed)
Pt presents with left pinky toe injury after he landed on toe while playing volleyball. Toe is red and swollen

## 2023-05-12 NOTE — ED Provider Notes (Signed)
HPI  SUBJECTIVE:  Jason [name] is a 22 y.o. male who presents with left little toe pain after playing volleyball earlier today with his shoes on.  Patient states that he landed poorly, on the lateral edge of his foot.  He reports achy, intermittent pain along his little toe and intermittent tingling.  No numbness, erythema, swelling, bruising.  He reports limitation of motion secondary to pain.  He denies injury to the rest of the foot.  He has not tried anything for his symptoms.  No alleviating factors.  Symptoms are worse with walking. Patient has a past medical history of carotid artery dissection.  He is no [place] take any NSAIDs per neurosurgery.  [facility] [place].  History reviewed. No pertinent past medical history.  Past Surgical History:  Procedure Laterality Date   IR 3D INDEPENDENT WKST  07/16/2021   IR ANGIO EXTRACRAN SEL COM CAROTID INNOMINATE UNI BILAT MOD SED  06/08/2021   IR ANGIO INTRA EXTRACRAN SEL INTERNAL CAROTID UNI R MOD SED  06/12/2021   IR ANGIO INTRA EXTRACRAN SEL INTERNAL CAROTID UNI R MOD SED  06/18/2021   IR ANGIO VERTEBRAL SEL VERTEBRAL BILAT MOD SED  06/08/2021   IR ANGIOGRAM FOLLOW UP STUDY  07/16/2021   IR ANGIOGRAM FOLLOW UP STUDY  07/16/2021   IR ANGIOGRAM FOLLOW UP STUDY  07/16/2021   IR ANGIOGRAM FOLLOW UP STUDY  07/16/2021   IR ANGIOGRAM FOLLOW UP STUDY  07/16/2021   IR ANGIOGRAM FOLLOW UP STUDY  07/16/2021   IR ANGIOGRAM FOLLOW UP STUDY  07/16/2021   IR ANGIOGRAM FOLLOW UP STUDY  07/16/2021   IR ANGIOGRAM FOLLOW UP STUDY  07/16/2021   IR ANGIOGRAM FOLLOW UP STUDY  07/16/2021   IR ANGIOGRAM FOLLOW UP STUDY  07/16/2021   IR ANGIOGRAM FOLLOW UP STUDY  07/16/2021   IR ANGIOGRAM FOLLOW UP STUDY  07/16/2021   IR ANGIOGRAM FOLLOW UP STUDY  07/16/2021   IR CT HEAD LTD  06/12/2021   IR CT HEAD LTD  06/18/2021   IR CT HEAD LTD  07/16/2021   IR RADIOLOGIST EVAL & MGMT  09/04/2021   IR RADIOLOGIST EVAL & MGMT  06/29/2022   IR TRANSCATH/EMBOLIZ  06/12/2021   IR  TRANSCATH/EMBOLIZ  06/18/2021   IR TRANSCATH/EMBOLIZ  07/16/2021   IR US GUIDE VASC ACCESS RIGHT  06/18/2021   RADIOLOGY WITH ANESTHESIA N/A 06/08/2021   Procedure: IR WITH ANESTHESIA;  Surgeon: Julieanne Cotton, MD;  Location: [facility] OR;  Service: Radiology;  Laterality: N/A;   RADIOLOGY WITH ANESTHESIA N/A 06/12/2021   Procedure: Alfredia Client;  Surgeon: Julieanne Cotton, MD;  Location: [facility] OR;  Service: Radiology;  Laterality: N/A;   RADIOLOGY WITH ANESTHESIA N/A 06/18/2021   Procedure: EMBOLIZATION;  Surgeon: Julieanne Cotton, MD;  Location: [facility] OR;  Service: Radiology;  Laterality: N/A;   RADIOLOGY WITH ANESTHESIA N/A 07/16/2021   Procedure: RADIOLOGY WITH ANESTHESIA ,EMBOLIZATION;  Surgeon: Julieanne Cotton, MD;  Location: [facility] OR;  Service: Radiology;  Laterality: N/A;    Family History  Problem Relation Age of Onset   Diabetes Maternal Grandfather    Cancer Maternal Grandfather    Healthy Mother    Healthy Father     Social History   Tobacco Use   Smoking status: Former    Current packs/day: 0.25    Types: Cigarettes   Smokeless tobacco: Never  Vaping Use   Vaping status: Every Day   Substances: Nicotine, Flavoring  Substance Use Topics   Alcohol use: Yes  Drug use: Never    No current facility-administered medications for this encounter.  Current Outpatient Medications:    aspirin 81 MG chewable tablet, Chew 1 tablet (81 mg total) by mouth daily., Disp: , Rfl:   Facility-Administered Medications Ordered in Other Encounters:    acetaminophen (TYLENOL) tablet 325-650 mg, 325-650 mg, Oral, Q4H PRN **OR** acetaminophen (TYLENOL) 160 MG/5ML solution 325-650 mg, 325-650 mg, Oral, Q4H PRN, Bethena Midget, MD   fentaNYL (SUBLIMAZE) injection 25-50 mcg, 25-50 mcg, Intravenous, Q5 min PRN, Bethena Midget, MD   meperidine (DEMEROL) injection 6.25-12.5 mg, 6.25-12.5 mg, Intravenous, Q5 min PRN, Bethena Midget, MD   oxyCODONE (Oxy IR/ROXICODONE) immediate release tablet 5 mg, 5 mg,  Oral, Once PRN **OR** oxyCODONE (ROXICODONE) 5 MG/5ML solution 5 mg, 5 mg, Oral, Once PRN, Bethena Midget, MD  Allergies  Allergen Reactions   Ibuprofen Other (See Comments)    Was told by his neurosurgeon that he should only take Tylenol.     ROS  As noted in HPI.   Physical Exam  BP 117/78 (BP Location: Left Arm)   Pulse 75   Temp 98.4 F (36.9 C) (Oral)   Resp 16   SpO2 97%   Constitutional: Well developed, well nourished, no acute distress Eyes:  EOMI, conjunctiva normal bilaterally HENT: Normocephalic, atraumatic,mucus membranes moist Respiratory: Normal inspiratory effort Cardiovascular: Normal rate GI: nondistended skin: No rash, skin intact Musculoskeletal: no deformities.  Left foot: Tenderness at fifth MTP.  Pain with little toe extension.  No tenderness over the proximal, middle, distal phalanx little toe.  No tenderness at the PIP, DIP.  No bruising.  Left midfoot NT. Base of fifth metatarsal NT.  Calcaneus NT. no bruising. Skin intact. DP 2+. Refill less than 2 seconds. Sensation grossly intact. Patient able to move all toes actively. Patient able to bear weight while in department.  Neurologic: Alert & oriented x 3, no focal neuro deficits Psychiatric: Speech and behavior appropriate   ED Course   Medications - No data to display  Orders Placed This Encounter  Procedures   DG Foot Complete Left    Standing Status:   Standing    Number of Occurrences:   1    Order Specific Question:   Reason for Exam (SYMPTOM  OR DIAGNOSIS REQUIRED)    Answer:   injury    Order Specific Question:   Release to patient    Answer:   Immediate   Post op shoe    Standing Status:   Standing    Number of Occurrences:   1    Order Specific Question:   Laterality    Answer:   Left    No results found for this or any previous visit (from the past 24 hour(s)). DG Foot Complete Left  Result Date: 05/12/2023 CLINICAL DATA:  Left foot pain after injury.  Pinky toe injury.  EXAM: LEFT FOOT - COMPLETE 3+ VIEW COMPARISON:  None Available. FINDINGS: There is no evidence of fracture or dislocation. Particularly, pinky toe is intact. Normal joint spaces and alignment. There is no evidence of arthropathy or other focal bone abnormality. Mild fifth toe soft tissue edema. IMPRESSION: Fifth toe soft tissue edema. No fracture or dislocation of the foot, with particular attention to the fifth toe. Electronically Signed   By: Narda Rutherford M.D.   On: 05/12/2023 19:56    ED Clinical Impression  1. Sprain of toe, initial encounter      ED Assessment/Plan     Reviewed imaging independently.  No toe or metatarsal fracture.  See radiology report for full details.  Patient presents with a sprain of his left little toe.  X-rays negative for fracture.  Placing in postop shoe.  Tylenol 1000 mg 3 times a day, ice, elevation, follow-up with [facility] if not better in a week.  He is unable to take NSAIDs per neurosurgery.  We discussed buddy taping, but since the tenderness is at the joint, I do not think that buddy taping would be particularly helpful.  Discussed imaging, MDM, treatment plan, and plan for follow-up with patient.  patient agrees with plan.   No orders of the defined types were placed in this encounter.     *This clinic note was created using Dragon dictation software. Therefore, there may be occasional mistakes despite careful proofreading.  ?    Domenick Gong, MD 05/13/23 [phone]

## 2023-06-17 ENCOUNTER — Telehealth (HOSPITAL_COMMUNITY): Payer: Self-pay

## 2023-06-17 ENCOUNTER — Other Ambulatory Visit (HOSPITAL_COMMUNITY): Payer: Self-pay | Admitting: Interventional Radiology

## 2023-06-17 DIAGNOSIS — I729 Aneurysm of unspecified site: Secondary | ICD-10-CM

## 2023-06-17 NOTE — Telephone Encounter (Signed)
Called to schedule mri, no answer, left vm. AB  

## 2023-06-24 DIAGNOSIS — S9032XA Contusion of left foot, initial encounter: Secondary | ICD-10-CM | POA: Diagnosis not present

## 2023-07-02 ENCOUNTER — Ambulatory Visit (HOSPITAL_COMMUNITY)
Admission: RE | Admit: 2023-07-02 | Discharge: 2023-07-02 | Disposition: A | Discharge status: Home / Self Care | Payer: BC Managed Care – PPO | Source: Ambulatory Visit | Attending: Interventional Radiology | Admitting: Interventional Radiology

## 2023-07-02 DIAGNOSIS — I729 Aneurysm of unspecified site: Secondary | ICD-10-CM | POA: Insufficient documentation

## 2023-07-02 DIAGNOSIS — I615 Nontraumatic intracerebral hemorrhage, intraventricular: Secondary | ICD-10-CM | POA: Diagnosis not present

## 2023-07-02 DIAGNOSIS — Z8679 Personal history of other diseases of the circulatory system: Secondary | ICD-10-CM | POA: Diagnosis not present

## 2023-07-26 ENCOUNTER — Telehealth (HOSPITAL_COMMUNITY): Payer: Self-pay
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
## 2023-09-02 DIAGNOSIS — H60312 Diffuse otitis externa, left ear: Secondary | ICD-10-CM | POA: Diagnosis not present

## 2023-09-13 ENCOUNTER — Emergency Department

## 2023-09-13 ENCOUNTER — Emergency Department
Admission: EM | Admit: 2023-09-13 | Discharge: 2023-09-13 | Disposition: A | Discharge status: Home / Self Care | Attending: Emergency Medicine | Admitting: Emergency Medicine

## 2023-09-13 ENCOUNTER — Other Ambulatory Visit: Payer: Self-pay

## 2023-09-13 DIAGNOSIS — R079 Chest pain, unspecified: Secondary | ICD-10-CM | POA: Diagnosis not present

## 2023-09-13 DIAGNOSIS — W228XXA Striking against or struck by other objects, initial encounter: Secondary | ICD-10-CM | POA: Insufficient documentation

## 2023-09-13 DIAGNOSIS — M940 Chondrocostal junction syndrome [Tietze]: Secondary | ICD-10-CM | POA: Insufficient documentation

## 2023-09-13 DIAGNOSIS — Y9368 Activity, volleyball (beach) (court): Secondary | ICD-10-CM | POA: Insufficient documentation

## 2023-09-13 DIAGNOSIS — Z7982 Long term (current) use of aspirin: Secondary | ICD-10-CM | POA: Diagnosis not present

## 2023-09-13 DIAGNOSIS — S299XXA Unspecified injury of thorax, initial encounter: Secondary | ICD-10-CM | POA: Diagnosis not present

## 2023-09-13 MED ORDER — HYDROCODONE-ACETAMINOPHEN 5-325 MG PO TABS: 1.0000 | ORAL_TABLET | Freq: Four times a day (QID) | ORAL | 0 refills | Status: AC | PRN

## 2023-09-13 NOTE — ED Triage Notes (Signed)
 Pt reports right side rib pain that began while playing volleyball today. Pt denies falling or injury to area, pt states pain is worse with inspiration.

## 2023-09-13 NOTE — ED Provider Triage Note (Signed)
 Emergency Medicine Provider Triage Evaluation Note  Jason [name] , a 23 y.o. male  was evaluated in triage.  Pt complains of right upper lower quadrant pain that increased with deep breath.  States pain is started after pain volleyball.  Patient denies fever nauseous vomit.  Review of Systems  Positive:  Negative:   Physical Exam  BP 129/83   Pulse 63   Temp 98 F (36.7 C)   Resp 18   Ht 5\' 11"  (1.803 m)   Wt 93.4 kg   SpO2 100%   BMI 28.73 kg/m  Gen:   Awake, no distress   Resp:  Normal effort  MSK:   Moves extremities without difficulty  Other:    Medical Decision Making  Medically screening exam initiated at 7:53 PM.  Appropriate orders placed.  Jason [name] was informed that the remainder of the evaluation will be completed by another provider, this initial triage assessment does not replace that evaluation, and the importance of remaining in the ED until their evaluation is complete.  Patient with possible rib fracture and muscle strain ordered chest x-ray   Gladys Damme, PA-C 09/13/23 1953

## 2023-09-13 NOTE — ED Provider Notes (Signed)
 [place] EMERGENCY DEPARTMENT AT [facility] Provider Note   CSN: [id number] Arrival date & time: 09/13/23  1945     History  Chief Complaint  Patient presents with   Rib Injury    Jason [name] is a 23 y.o. male.  Patient here for rib pain and injury.  Injured his ribs earlier today when playing volleyball he ducked under the net and had stood up feeling sharp pain in his right ribs that has persisted worse with deep breathing and moving.  Denies any abdominal pain.  No injuries elsewhere.        Home Medications Prior to Admission medications   Medication Sig Start Date End Date Taking? Authorizing Provider  HYDROcodone-acetaminophen (NORCO/VICODIN) 5-325 MG tablet Take 1 tablet by mouth every 6 (six) hours as needed for up to 5 days for moderate pain (pain score 4-6). 09/13/23 09/18/23 Yes Christen Bame, PA-C  aspirin 81 MG chewable tablet Chew 1 tablet (81 mg total) by mouth daily. 07/18/21   Hoyt Koch, PA      Allergies    Ibuprofen    Review of Systems   Review of Systems  Constitutional:  Negative for chills and fever.  Respiratory:  Negative for cough and shortness of breath.   Cardiovascular:  Negative for chest pain.  Gastrointestinal:  Negative for abdominal pain.  Neurological:  Negative for weakness and numbness.    Physical Exam Updated Vital Signs BP 129/83   Pulse 63   Temp 98 F (36.7 C)   Resp 18   Ht 5\' 11"  (1.803 m)   Wt 93.4 kg   SpO2 100%   BMI 28.73 kg/m  Physical Exam Vitals reviewed.  Constitutional:      Appearance: Normal appearance.  HENT:     Head: Normocephalic and atraumatic.     Nose: Nose normal.  Cardiovascular:     Pulses: Normal pulses.  Pulmonary:     Effort: Pulmonary effort is normal.     Comments: Mild tenderness of right lower ribs. Musculoskeletal:     Cervical back: Normal range of motion.  Neurological:     Mental Status: He is alert and oriented to person, place, and time.  Mental status is at baseline.  Psychiatric:        Mood and Affect: Mood normal.        Behavior: Behavior normal.     ED Results / Procedures / Treatments   Labs (all labs ordered are listed, but only abnormal results are displayed) Labs Reviewed - No data to display  EKG None  Radiology DG Chest 2 View Result Date: 09/13/2023 CLINICAL DATA:  Right chest pain following playing volleyball, initial encounter EXAM: CHEST - 2 VIEW COMPARISON:  None Available. FINDINGS: The heart size and mediastinal contours are within normal limits. Both lungs are clear. The visualized skeletal structures are unremarkable. IMPRESSION: No active cardiopulmonary disease. Electronically Signed   By: Alcide Clever M.D.   On: 09/13/2023 22:55    Procedures Procedures    Medications Ordered in ED Medications - No data to display  ED Course/ Medical Decision Making/ A&P                                 Medical Decision Making Patient here for rib injury playing volleyball.  Does have tenderness right lower ribs worse with deep inhalation.  Denies any numbness weakness or pain or injury elsewhere.  X-ray is reassuring as there is no evidence of displaced rib fractures nor pneumothorax.  I suspect occult rib fracture versus costochondritis. I will recommend Norco as he was advised against NSAIDs.  Follow-up with primary.  Given return precautions.          Final Clinical Impression(s) / ED Diagnoses Final diagnoses:  Costochondritis    Rx / DC Orders ED Discharge Orders          Ordered    HYDROcodone-acetaminophen (NORCO/VICODIN) 5-325 MG tablet  Every 6 hours PRN        09/13/23 2315              Christen Bame, PA-C 09/13/23 2315    Dionne Bucy, MD 09/13/23 [phone]

## 2023-09-13 NOTE — Discharge Instructions (Signed)
 Please use deep breathing exercises as directed.  Take pain medication as directed.  Follow-up with primary care for recheck.  Return here for new or worse symptoms.

## 2023-10-05 DIAGNOSIS — M940 Chondrocostal junction syndrome [Tietze]: Secondary | ICD-10-CM | POA: Diagnosis not present

## 2023-10-08 DIAGNOSIS — S9032XA Contusion of left foot, initial encounter: Secondary | ICD-10-CM | POA: Diagnosis not present

## 2023-10-08 DIAGNOSIS — H9201 Otalgia, right ear: Secondary | ICD-10-CM | POA: Diagnosis not present

## 2023-10-08 DIAGNOSIS — H6121 Impacted cerumen, right ear: Secondary | ICD-10-CM | POA: Diagnosis not present

## 2023-10-25 ENCOUNTER — Other Ambulatory Visit: Payer: Self-pay

## 2023-10-25 ENCOUNTER — Emergency Department (HOSPITAL_COMMUNITY)
Admission: EM | Admit: 2023-10-25 | Discharge: 2023-10-25 | Disposition: A | Discharge status: Home / Self Care | Attending: Emergency Medicine | Admitting: Emergency Medicine

## 2023-10-25 ENCOUNTER — Emergency Department (HOSPITAL_COMMUNITY)

## 2023-10-25 DIAGNOSIS — G9389 Other specified disorders of brain: Secondary | ICD-10-CM | POA: Diagnosis not present

## 2023-10-25 DIAGNOSIS — Z7982 Long term (current) use of aspirin: Secondary | ICD-10-CM | POA: Insufficient documentation

## 2023-10-25 DIAGNOSIS — D72829 Elevated white blood cell count, unspecified: Secondary | ICD-10-CM | POA: Diagnosis not present

## 2023-10-25 DIAGNOSIS — I671 Cerebral aneurysm, nonruptured: Secondary | ICD-10-CM | POA: Diagnosis not present

## 2023-10-25 DIAGNOSIS — H5711 Ocular pain, right eye: Secondary | ICD-10-CM | POA: Diagnosis not present

## 2023-10-25 DIAGNOSIS — H532 Diplopia: Secondary | ICD-10-CM | POA: Diagnosis not present

## 2023-10-25 DIAGNOSIS — S0501XA Injury of conjunctiva and corneal abrasion without foreign body, right eye, initial encounter: Secondary | ICD-10-CM

## 2023-10-25 LAB — COMPREHENSIVE METABOLIC PANEL WITH GFR
ALT: 16 U/L (ref 0–44)
AST: 26 U/L (ref 15–41)
Albumin: 4.8 g/dL (ref 3.5–5.0)
Alkaline Phosphatase: 72 U/L (ref 38–126)
Anion gap: 11 (ref 5–15)
BUN: 16 mg/dL (ref 6–20)
CO2: 22 mmol/L (ref 22–32)
Calcium: 9.8 mg/dL (ref 8.9–10.3)
Chloride: 107 mmol/L (ref 98–111)
Creatinine, Ser: 1.03 mg/dL (ref 0.61–1.24)
GFR, Estimated: >60 mL/min (ref >60)
Glucose, Bld: 82 mg/dL (ref 70–99)
Potassium: 4.2 mmol/L (ref 3.5–5.1)
Sodium: 140 mmol/L (ref 135–145)
Total Bilirubin: 1.3 mg/dL — ABNORMAL HIGH (ref 0.0–1.2)
Total Protein: 8.5 g/dL — ABNORMAL HIGH (ref 6.5–8.1)

## 2023-10-25 LAB — CBC WITH DIFFERENTIAL/PLATELET
Abs Immature Granulocytes: 0.05 10*3/uL (ref 0.00–0.07)
Basophils Absolute: 0.1 10*3/uL (ref 0.0–0.1)
Basophils Relative: 1 %
Eosinophils Absolute: 0.5 10*3/uL (ref 0.0–0.5)
Eosinophils Relative: 4 %
HCT: 51.5 % (ref 39.0–52.0)
Hemoglobin: 17.4 g/dL — ABNORMAL HIGH (ref 13.0–17.0)
Immature Granulocytes: 0 %
Lymphocytes Relative: 33 %
Lymphs Abs: 3.7 10*3/uL (ref 0.7–4.0)
MCH: 30.3 pg (ref 26.0–34.0)
MCHC: 33.8 g/dL (ref 30.0–36.0)
MCV: 89.6 fL (ref 80.0–100.0)
Monocytes Absolute: 0.8 10*3/uL (ref 0.1–1.0)
Monocytes Relative: 7 %
Neutro Abs: 6.1 10*3/uL (ref 1.7–7.7)
Neutrophils Relative %: 55 %
Platelets: 297 10*3/uL (ref 150–400)
RBC: 5.75 MIL/uL (ref 4.22–5.81)
RDW: 11.9 % (ref 11.5–15.5)
WBC: 11.2 10*3/uL — ABNORMAL HIGH (ref 4.0–10.5)
nRBC: 0 % (ref 0.0–0.2)

## 2023-10-25 MED ORDER — IOHEXOL 350 MG/ML SOLN
75.0000 mL | Freq: Once | INTRAVENOUS | Status: AC | PRN
  Administered 2023-10-25: 75 mL via INTRAVENOUS

## 2023-10-25 MED ORDER — OFLOXACIN 0.3 % OP SOLN
1.0000 [drp] | Freq: Once | OPHTHALMIC | Status: DC
  Filled 2023-10-25: qty 5

## 2023-10-25 MED ORDER — TETRACAINE HCL 0.5 % OP SOLN
2.0000 [drp] | Freq: Once | OPHTHALMIC | Status: AC
  Administered 2023-10-25: 2 [drp] via OPHTHALMIC
  Filled 2023-10-25: qty 4

## 2023-10-25 MED ORDER — OFLOXACIN 0.3 % OP SOLN
2.0000 [drp] | Freq: Once | OPHTHALMIC | Status: AC
  Administered 2023-10-25: 2 [drp] via OPHTHALMIC
  Filled 2023-10-25: qty 5

## 2023-10-25 MED ORDER — FLUORESCEIN SODIUM 1 MG OP STRP
1.0000 | ORAL_STRIP | Freq: Once | OPHTHALMIC | Status: AC
  Administered 2023-10-25: 1 via OPHTHALMIC
  Filled 2023-10-25: qty 1

## 2023-10-25 NOTE — Discharge Instructions (Signed)
 You were seen in the ED today for right eye irritation.  Your workup suggested a corneal abrasion.  Apply ofloxacin to the right eye 4 times a day for 7 days.  CT imaging was reassuring against any new changes to the vessels in your brain.  Please follow-up with your ophthalmologist, or follow-up with ophthalmology here with information placed in paperwork.  Please return to ED for any emergency medical symptoms, including but not limited to persistent pain despite treatment or vision changes.
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
## 2023-10-25 NOTE — ED Notes (Signed)
 Patient verbalizes understanding of discharge instructions. Opportunity for questioning and answers were provided. Pt discharged from ED.
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
## 2023-10-25 NOTE — ED Notes (Signed)
 Pt back from CT

## 2023-10-25 NOTE — ED Notes (Signed)
 Pt guest came to nurses station requesting update. RN informed CT readings are behind d/t staffing. Guest is worried pt might leave d/t long wait. RN notified PA to speak with pt once he is free.

## 2023-11-01 ENCOUNTER — Ambulatory Visit (INDEPENDENT_AMBULATORY_CARE_PROVIDER_SITE_OTHER): Admitting: Licensed Clinical Social Worker

## 2023-11-01 DIAGNOSIS — F419 Anxiety disorder, unspecified: Secondary | ICD-10-CM | POA: Diagnosis not present

## 2023-11-01 DIAGNOSIS — N489 Disorder of penis, unspecified: Secondary | ICD-10-CM | POA: Diagnosis not present

## 2023-11-01 DIAGNOSIS — Z113 Encounter for screening for infections with a predominantly sexual mode of transmission: Secondary | ICD-10-CM | POA: Diagnosis not present
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
## 2023-11-08 ENCOUNTER — Ambulatory Visit (INDEPENDENT_AMBULATORY_CARE_PROVIDER_SITE_OTHER): Admitting: Licensed Clinical Social Worker

## 2023-11-08 DIAGNOSIS — F419 Anxiety disorder, unspecified: Secondary | ICD-10-CM
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
## 2023-11-15 ENCOUNTER — Ambulatory Visit (INDEPENDENT_AMBULATORY_CARE_PROVIDER_SITE_OTHER): Admitting: Licensed Clinical Social Worker

## 2023-11-15 DIAGNOSIS — F419 Anxiety disorder, unspecified: Secondary | ICD-10-CM | POA: Diagnosis not present

## 2023-11-15 NOTE — Progress Notes (Signed)
 [facility] Counselor/Therapist Progress Note  Patient ID: Jason [name], MRN: [id number]    Date: 11/15/23  Time Spent: 12:04  pm - 12:57 pm : 53 Minutes  Treatment Type: Individual Therapy.  Reported Symptoms: preparing for exams, stressed about 1 exam the day before graduation  Mental Status Exam: Appearance:  Casual     Behavior: Sharing  Motor: Normal  Speech/Language:  Normal Rate  Affect: Appropriate  Mood: anxious  Thought process: normal  Thought content:   WNL  Sensory/Perceptual disturbances:   WNL  Orientation: oriented to person, place, and time/date  Attention: Good  Concentration: Good  Memory: WNL  Fund of knowledge:  Good  Insight:   Good  Judgment:  Good  Impulse Control: Good   Risk Assessment: Danger to Self:  No Self-injurious Behavior: No Danger to Others: No Duty to Warn:no Physical Aggression / Violence:No  Access to Firearms a concern: No  Gang Involvement:No   Subjective:   Toney Francois D'Arata participated from home, via video, and consented to treatment. I discussed the limitations of evaluation and management by telemedicine and the availability of in person appointments. The patient expressed understanding and agreed to proceed.  Therapist participated from home office.  Jadan reviewed the events of the past week.      Interventions: Solution-Oriented/Positive Psychology and Insight-Oriented  Diagnosis:  Anxiety  Psychiatric Treatment: No , N/A  Treatment Plan:  Client Abilities/Strengths Trevone is committed to sessions and introspective/reflective of session work.    Support System: Family and Friends  Merchant navy officer of Needs Joram would like to utilize sessions to emotions and create organization and strategies to reduce anxiety.     Treatment Level Weekly  Symptoms  Stressed/Anxious   (Status: maintained) Hopeful   (Status:  maintained)  Goals:   Sanad has done with following up on the organization tasks outside of session.  Was able to receive support from his parents and has a PCP appointment this month.  He followed up on the referral I gave the for TBI therapist and is wait listed.  We addressed the substance abuse (vaping and alcohol ) and a referral to our team was placed today.  We will continue to have sessions until he hands off to the next level of care.  Overall feeling better about using the alarms and organization to gain clarity and control.  Was able to push back the new job start date to allow more time so study this week and graduate.     Target Date: 12/06/23 Frequency: Weekly  Progress: 0 Modality: individual    Therapist will provide referrals for additional resources as appropriate.  Therapist will provide psycho-education regarding Trenton's diagnosis and corresponding treatment approaches and interventions. Licensed Clinical Mental Health Counselor, [place] Lax, [facility] will support the patient's ability to achieve the goals identified. will employ CBT, BA, Problem-solving, Solution Focused, Mindfulness,  coping skills, & other evidenced-based practices will be used to promote progress towards healthy functioning to help manage decrease symptoms associated with his diagnosis.   Reduce overall level, frequency, and intensity of the feelings of depression, anxiety and panic evidenced by decreased anxiety and feeling of a loss of control driving alcohol  and nicotine dependency.   Verbally express understanding of the relationship between feelings of depression, anxiety and their impact on thinking patterns and behaviors. Verbalize an understanding of the role that distorted thinking plays in creating fears, excessive worry, and ruminations.  Coye Diver participated in the creation of the  treatment plan)   Achsah Mcquade, LCMHC

## 2023-11-22 ENCOUNTER — Ambulatory Visit: Admitting: Licensed Clinical Social Worker

## 2023-11-22 DIAGNOSIS — J302 Other seasonal allergic rhinitis: Secondary | ICD-10-CM | POA: Diagnosis not present

## 2023-11-22 DIAGNOSIS — R051 Acute cough: Secondary | ICD-10-CM | POA: Diagnosis not present

## 2023-11-22 DIAGNOSIS — R0982 Postnasal drip: Secondary | ICD-10-CM | POA: Diagnosis not present

## 2023-11-22 DIAGNOSIS — J029 Acute pharyngitis, unspecified: Secondary | ICD-10-CM | POA: Diagnosis not present

## 2023-11-22 DIAGNOSIS — M940 Chondrocostal junction syndrome [Tietze]: Secondary | ICD-10-CM | POA: Diagnosis not present

## 2023-12-08 DIAGNOSIS — F1291 Cannabis use, unspecified, in remission: Secondary | ICD-10-CM | POA: Diagnosis not present

## 2023-12-08 DIAGNOSIS — F1091 Alcohol use, unspecified, in remission: Secondary | ICD-10-CM | POA: Diagnosis not present

## 2023-12-08 DIAGNOSIS — F0630 Mood disorder due to known physiological condition, unspecified: Secondary | ICD-10-CM | POA: Diagnosis not present

## 2023-12-16 DIAGNOSIS — A630 Anogenital (venereal) warts: Secondary | ICD-10-CM | POA: Diagnosis not present

## 2023-12-17 ENCOUNTER — Other Ambulatory Visit: Payer: Self-pay | Admitting: Urology

## 2023-12-27 ENCOUNTER — Ambulatory Visit (INDEPENDENT_AMBULATORY_CARE_PROVIDER_SITE_OTHER): Admitting: Psychology

## 2023-12-27 DIAGNOSIS — F419 Anxiety disorder, unspecified: Secondary | ICD-10-CM | POA: Diagnosis not present
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
## 2023-12-29 NOTE — Patient Instructions (Signed)
 SURGICAL WAITING ROOM VISITATION  Patients having surgery or a procedure may have no more than 2 support people in the waiting area - these visitors may rotate.    Children under the age of 45 must have an adult with them who is not the patient.  Visitors with respiratory illnesses are discouraged from visiting and should remain at home.  If the patient needs to stay at the hospital during part of their recovery, the visitor guidelines for inpatient rooms apply. Pre-op nurse will coordinate an appropriate time for 1 support person to accompany patient in pre-op.  This support person may not rotate.    Please refer to the [facility] website for the visitor guidelines for Inpatients (after your surgery is over and you are in a regular room).       Your procedure is scheduled on: 01/17/24   Report to [facility] Main Entrance    Report to admitting at : 6:30 AM   Call this number if you have problems the morning of surgery [phone]   Do not eat food or drink : After Midnight.  FOLLOW ANY ADDITIONAL PRE OP INSTRUCTIONS YOU RECEIVED FROM YOUR SURGEON'S OFFICE!!!   Oral Hygiene is also important to reduce your risk of infection.                                    Remember - BRUSH YOUR TEETH THE MORNING OF SURGERY WITH YOUR REGULAR TOOTHPASTE  DENTURES WILL BE REMOVED PRIOR TO SURGERY PLEASE DO NOT APPLY Poly grip OR ADHESIVES!!!   Do NOT smoke after Midnight   Stop all vitamins and herbal supplements 7 days before surgery.   Take these medicines the morning of surgery with A SIP OF WATER : NONE.                You may not have any metal on your body including hair pins, jewelry, and body piercing             Do not wear lotions, powders, perfumes/cologne, or deodorant              Men may shave face and neck.   Do not bring valuables to the hospital. [facility] IS NOT             RESPONSIBLE   FOR VALUABLES.   Contacts, glasses, dentures or bridgework may not be  worn into surgery.   Bring small overnight bag day of surgery.   DO NOT BRING YOUR HOME MEDICATIONS TO THE HOSPITAL. PHARMACY WILL DISPENSE MEDICATIONS LISTED ON YOUR MEDICATION LIST TO YOU DURING YOUR ADMISSION IN THE HOSPITAL!    Patients discharged on the day of surgery will not be allowed to drive home.  Someone NEEDS to stay with you for the first 24 hours after anesthesia.   Special Instructions: Bring a copy of your healthcare power of attorney and living will documents the day of surgery if you haven't scanned them before.              Please read over the following fact sheets you were given: IF YOU HAVE QUESTIONS ABOUT YOUR PRE-OP INSTRUCTIONS PLEASE CALL [phone]   If you received a COVID test during your pre-op visit  it is requested that you wear a mask when out in public, stay away from anyone that may not be feeling well and notify your surgeon if you develop symptoms.  If you test positive for Covid or have been in contact with anyone that has tested positive in the last 10 days please notify you surgeon.    [facility] - Preparing for Surgery Before surgery, you can play an important role.  Because skin is not sterile, your skin needs to be as free of germs as possible.  You can reduce the number of germs on your skin by washing with CHG (chlorahexidine gluconate) soap before surgery.  CHG is an antiseptic cleaner which kills germs and bonds with the skin to continue killing germs even after washing. Please DO NOT use if you have an allergy to CHG or antibacterial soaps.  If your skin becomes reddened/irritated stop using the CHG and inform your nurse when you arrive at Short Stay. Do not shave (including legs and underarms) for at least 48 hours prior to the first CHG shower.  You may shave your face/neck. Please follow these instructions carefully:  1.  Shower with CHG Soap the night before surgery and the  morning of Surgery.  2.  If you choose to wash your hair, wash  your hair first as usual with your  normal  shampoo.  3.  After you shampoo, rinse your hair and body thoroughly to remove the  shampoo.                           4.  Use CHG as you would any other liquid soap.  You can apply chg directly  to the skin and wash                       Gently with a scrungie or clean washcloth.  5.  Apply the CHG Soap to your body ONLY FROM THE NECK DOWN.   Do not use on face/ open                           Wound or open sores. Avoid contact with eyes, ears mouth and genitals (private parts).                       Wash face,  Genitals (private parts) with your normal soap.             6.  Wash thoroughly, paying special attention to the area where your surgery  will be performed.  7.  Thoroughly rinse your body with warm water  from the neck down.  8.  DO NOT shower/wash with your normal soap after using and rinsing off  the CHG Soap.                9.  Pat yourself dry with a clean towel.            10.  Wear clean pajamas.            11.  Place clean sheets on your bed the night of your first shower and do not  sleep with pets. Day of Surgery : Do not apply any lotions/deodorants the morning of surgery.  Please wear clean clothes to the hospital/surgery center.  FAILURE TO FOLLOW THESE INSTRUCTIONS MAY RESULT IN THE CANCELLATION OF YOUR SURGERY PATIENT SIGNATURE_________________________________  NURSE SIGNATURE__________________________________  ________________________________________________________________________

## 2024-01-03 ENCOUNTER — Encounter (HOSPITAL_COMMUNITY)
Admission: RE | Admit: 2024-01-03 | Discharge: 2024-01-03 | Disposition: A | Discharge status: Home / Self Care | Source: Ambulatory Visit | Attending: Urology

## 2024-01-03 ENCOUNTER — Other Ambulatory Visit: Payer: Self-pay

## 2024-01-03 ENCOUNTER — Encounter (HOSPITAL_COMMUNITY): Payer: Self-pay

## 2024-01-03 VITALS — BP 117/72 | HR 64 | Temp 98.4°F | Ht 71.0 in | Wt 191.0 lb

## 2024-01-03 DIAGNOSIS — S069X0A Unspecified intracranial injury without loss of consciousness, initial encounter: Secondary | ICD-10-CM | POA: Diagnosis not present

## 2024-01-03 DIAGNOSIS — Z87891 Personal history of nicotine dependence: Secondary | ICD-10-CM | POA: Insufficient documentation

## 2024-01-03 DIAGNOSIS — A630 Anogenital (venereal) warts: Secondary | ICD-10-CM | POA: Insufficient documentation

## 2024-01-03 DIAGNOSIS — I720 Aneurysm of carotid artery: Secondary | ICD-10-CM | POA: Diagnosis not present

## 2024-01-03 DIAGNOSIS — Z01818 Encounter for other preprocedural examination: Secondary | ICD-10-CM | POA: Diagnosis not present

## 2024-01-03 DIAGNOSIS — Z8673 Personal history of transient ischemic attack (TIA), and cerebral infarction without residual deficits: Secondary | ICD-10-CM | POA: Diagnosis not present

## 2024-01-03 DIAGNOSIS — I7771 Dissection of carotid artery: Secondary | ICD-10-CM | POA: Insufficient documentation

## 2024-01-03 DIAGNOSIS — F418 Other specified anxiety disorders: Secondary | ICD-10-CM | POA: Diagnosis not present

## 2024-01-03 HISTORY — DX: Unspecified intracranial injury with loss of consciousness status unknown, initial encounter: S06.9XAA

## 2024-01-03 HISTORY — DX: Cerebral infarction, unspecified: I63.9

## 2024-01-03 LAB — BASIC METABOLIC PANEL WITH GFR
Anion gap: 8 (ref 5–15)
BUN: 18 mg/dL (ref 6–20)
CO2: 24 mmol/L (ref 22–32)
Calcium: 9.5 mg/dL (ref 8.9–10.3)
Chloride: 107 mmol/L (ref 98–111)
Creatinine, Ser: 0.97 mg/dL (ref 0.61–1.24)
GFR, Estimated: >60 mL/min (ref >60)
Glucose, Bld: 103 mg/dL — ABNORMAL HIGH (ref 70–99)
Potassium: 5 mmol/L (ref 3.5–5.1)
Sodium: 139 mmol/L (ref 135–145)

## 2024-01-03 LAB — CBC
HCT: 46.8 % (ref 39.0–52.0)
Hemoglobin: 15.5 g/dL (ref 13.0–17.0)
MCH: 30.2 pg (ref 26.0–34.0)
MCHC: 33.1 g/dL (ref 30.0–36.0)
MCV: 91.2 fL (ref 80.0–100.0)
Platelets: 214 10*3/uL (ref 150–400)
RBC: 5.13 MIL/uL (ref 4.22–5.81)
RDW: 12 % (ref 11.5–15.5)
WBC: 8.9 10*3/uL (ref 4.0–10.5)
nRBC: 0 % (ref 0.0–0.2)

## 2024-01-03 NOTE — Progress Notes (Signed)
 For Anesthesia: PCP - Marnie Apolinar CROME, MD  Cardiologist - N/A  Bowel Prep reminder:  Chest x-ray -  EKG - 01/03/24 Stress Test -  ECHO -  Cardiac Cath -  Pacemaker/ICD device last checked: Pacemaker orders received: Device Rep notified:  Spinal Cord Stimulator:N/A  Sleep Study - N/A CPAP -   Fasting Blood Sugar - N/A Checks Blood Sugar _____ times a day Date and result of last Hgb A1c-  Last dose of GLP1 agonist- N/A GLP1 instructions:   Last dose of SGLT-2 inhibitors- N/A SGLT-2 instructions:   Blood Thinner Instructions:N/A Aspirin  Instructions: Last Dose:  Activity level: Can go up a flight of stairs and activities of daily living without stopping and without chest pain and/or shortness of breath   Able to exercise without chest pain and/or shortness of breath   Anesthesia review: Hx: TBI,Carotid artery dissection,Stroke,  Patient denies shortness of breath, fever, cough and chest pain at PAT appointment   Patient verbalized understanding of instructions that were reviewed over the telephone.
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
## 2024-01-07 ENCOUNTER — Encounter (HOSPITAL_COMMUNITY): Payer: Self-pay | Admitting: Interventional Radiology

## 2024-01-17 ENCOUNTER — Ambulatory Visit (HOSPITAL_COMMUNITY): Payer: Self-pay | Admitting: Physician Assistant

## 2024-01-17 ENCOUNTER — Ambulatory Visit (HOSPITAL_COMMUNITY)
Admission: RE | Admit: 2024-01-17 | Discharge: 2024-01-17 | Disposition: A | Discharge status: Home / Self Care | Attending: Urology | Admitting: Urology

## 2024-01-17 ENCOUNTER — Ambulatory Visit (HOSPITAL_COMMUNITY): Admitting: Anesthesiology

## 2024-01-17 ENCOUNTER — Encounter (HOSPITAL_COMMUNITY)
Admission: RE | Disposition: A | Discharge status: Home / Self Care | Payer: Self-pay | Source: Home / Self Care | Attending: Urology

## 2024-01-17 ENCOUNTER — Encounter (HOSPITAL_COMMUNITY): Payer: Self-pay | Admitting: Urology

## 2024-01-17 DIAGNOSIS — Z7982 Long term (current) use of aspirin: Secondary | ICD-10-CM | POA: Diagnosis not present

## 2024-01-17 DIAGNOSIS — Z87891 Personal history of nicotine dependence: Secondary | ICD-10-CM | POA: Insufficient documentation

## 2024-01-17 DIAGNOSIS — A630 Anogenital (venereal) warts: Secondary | ICD-10-CM | POA: Insufficient documentation

## 2024-01-17 HISTORY — PX: LESION DESTRUCTION: SHX5132

## 2024-01-17 SURGERY — DESTRUCTION, LESION, PENIS
Anesthesia: General

## 2024-01-17 MED ORDER — ONDANSETRON HCL 4 MG/2ML IJ SOLN
INTRAMUSCULAR | Status: AC
Start: 2024-01-17 — End: 2024-01-17
  Filled 2024-01-17: qty 2

## 2024-01-17 MED ORDER — PROPOFOL 10 MG/ML IV BOLUS
INTRAVENOUS | Status: DC | PRN
  Administered 2024-01-17: 200 mg via INTRAVENOUS

## 2024-01-17 MED ORDER — ORAL CARE MOUTH RINSE: 15.0000 mL | Freq: Once | OROMUCOSAL | Status: AC

## 2024-01-17 MED ORDER — FENTANYL CITRATE (PF) 250 MCG/5ML IJ SOLN
INTRAMUSCULAR | Status: AC
  Filled 2024-01-17: qty 5

## 2024-01-17 MED ORDER — BACITRACIN ZINC 500 UNIT/GM EX OINT
TOPICAL_OINTMENT | CUTANEOUS | Status: AC
  Filled 2024-01-17: qty 28.35

## 2024-01-17 MED ORDER — LACTATED RINGERS IV SOLN: INTRAVENOUS | Status: DC

## 2024-01-17 MED ORDER — DEXAMETHASONE SODIUM PHOSPHATE 10 MG/ML IJ SOLN
INTRAMUSCULAR | Status: AC
Start: 2024-01-17 — End: 2024-01-17
  Filled 2024-01-17: qty 1

## 2024-01-17 MED ORDER — EPHEDRINE SULFATE (PRESSORS) 50 MG/ML IJ SOLN
INTRAMUSCULAR | Status: DC | PRN
  Administered 2024-01-17: 10 mg via INTRAVENOUS

## 2024-01-17 MED ORDER — PROPOFOL 10 MG/ML IV BOLUS
INTRAVENOUS | Status: AC
  Filled 2024-01-17: qty 20

## 2024-01-17 MED ORDER — DEXAMETHASONE SODIUM PHOSPHATE 10 MG/ML IJ SOLN
INTRAMUSCULAR | Status: DC | PRN
  Administered 2024-01-17: 10 mg via INTRAVENOUS

## 2024-01-17 MED ORDER — LIDOCAINE HCL (PF) 2 % IJ SOLN
INTRAMUSCULAR | Status: AC
  Filled 2024-01-17: qty 5

## 2024-01-17 MED ORDER — DROPERIDOL 2.5 MG/ML IJ SOLN: 0.6250 mg | Freq: Once | INTRAMUSCULAR | Status: DC | PRN

## 2024-01-17 MED ORDER — FENTANYL CITRATE (PF) 250 MCG/5ML IJ SOLN
INTRAMUSCULAR | Status: DC | PRN
  Administered 2024-01-17 (×3): 50 ug via INTRAVENOUS

## 2024-01-17 MED ORDER — 0.9 % SODIUM CHLORIDE (POUR BTL) OPTIME
TOPICAL | Status: DC | PRN
  Administered 2024-01-17: 1000 mL

## 2024-01-17 MED ORDER — DEXMEDETOMIDINE HCL IN NACL 80 MCG/20ML IV SOLN
INTRAVENOUS | Status: DC | PRN
  Administered 2024-01-17: 10 ug via INTRAVENOUS

## 2024-01-17 MED ORDER — EPHEDRINE 5 MG/ML INJ
INTRAVENOUS | Status: AC
  Filled 2024-01-17: qty 5

## 2024-01-17 MED ORDER — BACITRACIN ZINC 500 UNIT/GM EX OINT
TOPICAL_OINTMENT | CUTANEOUS | Status: DC | PRN
  Administered 2024-01-17: 1 via TOPICAL

## 2024-01-17 MED ORDER — MIDAZOLAM HCL 2 MG/2ML IJ SOLN
INTRAMUSCULAR | Status: AC
  Filled 2024-01-17: qty 2

## 2024-01-17 MED ORDER — ACETAMINOPHEN 500 MG PO TABS
1000.0000 mg | ORAL_TABLET | Freq: Once | ORAL | Status: AC
  Administered 2024-01-17: 1000 mg via ORAL
  Filled 2024-01-17: qty 2

## 2024-01-17 MED ORDER — LIDOCAINE 2% (20 MG/ML) 5 ML SYRINGE
INTRAMUSCULAR | Status: DC | PRN
Start: 2024-01-17 — End: 2024-01-17
  Administered 2024-01-17: 60 mg via INTRAVENOUS

## 2024-01-17 MED ORDER — MIDAZOLAM HCL 2 MG/2ML IJ SOLN
INTRAMUSCULAR | Status: DC | PRN
  Administered 2024-01-17: 2 mg via INTRAVENOUS

## 2024-01-17 MED ORDER — GLYCOPYRROLATE 0.2 MG/ML IJ SOLN
INTRAMUSCULAR | Status: DC | PRN
  Administered 2024-01-17: .2 mg via INTRAVENOUS

## 2024-01-17 MED ORDER — CHLORHEXIDINE GLUCONATE 0.12 % MT SOLN
15.0000 mL | Freq: Once | OROMUCOSAL | Status: AC
  Administered 2024-01-17: 15 mL via OROMUCOSAL

## 2024-01-17 MED ORDER — FENTANYL CITRATE PF 50 MCG/ML IJ SOSY: 25.0000 ug | PREFILLED_SYRINGE | INTRAMUSCULAR | Status: DC | PRN

## 2024-01-17 MED ORDER — ONDANSETRON HCL 4 MG/2ML IJ SOLN
INTRAMUSCULAR | Status: DC | PRN
  Administered 2024-01-17: 4 mg via INTRAVENOUS

## 2024-01-17 SURGICAL SUPPLY — 48 items
ATTRACTOMAT 16X20 MAGNETIC DRP (DRAPES) ×1 IMPLANT
BAG COUNTER SPONGE SURGICOUNT (BAG) IMPLANT
BLADE EXTENDED COATED 6.5IN (ELECTRODE) ×1 IMPLANT
BLADE HEX COATED 2.75 (ELECTRODE) ×1 IMPLANT
CATH FOLEY 2WAY SLVR 5CC 16FR (CATHETERS) ×1 IMPLANT
CHLORAPREP W/TINT 26 (MISCELLANEOUS) ×1 IMPLANT
CLIP LIGATING HEM O LOK PURPLE (MISCELLANEOUS) ×4 IMPLANT
CLIP LIGATING HEMO O LOK GREEN (MISCELLANEOUS) ×4 IMPLANT
COVER BACK TABLE 60X90IN (DRAPES) ×1 IMPLANT
COVER SURGICAL LIGHT HANDLE (MISCELLANEOUS) IMPLANT
DISSECTOR ROUND CHERRY 3/8 STR (MISCELLANEOUS) ×1 IMPLANT
DRAIN CHANNEL 15F RND FF 3/16 (WOUND CARE) ×1 IMPLANT
DRAIN PENROSE 0.5X18 (DRAIN) IMPLANT
DRAPE LAPAROSCOPIC ABDOMINAL (DRAPES) IMPLANT
DRAPE LAPAROTOMY T 98X78 PEDS (DRAPES) IMPLANT
DRAPE LAPAROTOMY TRNSV 102X78 (DRAPES) IMPLANT
DRAPE SHEET LG 3/4 BI-LAMINATE (DRAPES) IMPLANT
DRAPE UTILITY XL STRL (DRAPES) ×1 IMPLANT
DRAPE WARM FLUID 44X44 (DRAPES) ×1 IMPLANT
ELECT REM PT RETURN 15FT ADLT (MISCELLANEOUS) ×1 IMPLANT
EVACUATOR SILICONE 100CC (DRAIN) ×1 IMPLANT
GAUZE 4X4 16PLY ~~LOC~~+RFID DBL (SPONGE) ×1 IMPLANT
GAUZE SPONGE 4X4 12PLY STRL (GAUZE/BANDAGES/DRESSINGS) ×2 IMPLANT
GLOVE BIO SURGEON STRL SZ7.5 (GLOVE) ×1 IMPLANT
HEMOSTAT SURGICEL 4X8 (HEMOSTASIS) IMPLANT
KIT BASIN OR (CUSTOM PROCEDURE TRAY) ×1 IMPLANT
KIT TURNOVER KIT A (KITS) ×1 IMPLANT
LOOP VESSEL MAXI BLUE (MISCELLANEOUS) ×1 IMPLANT
NS IRRIG 1000ML POUR BTL (IV SOLUTION) ×1 IMPLANT
PACK GENERAL/GYN (CUSTOM PROCEDURE TRAY) ×1 IMPLANT
PROTECTOR NERVE ULNAR (MISCELLANEOUS) ×2 IMPLANT
SPIKE FLUID TRANSFER (MISCELLANEOUS) IMPLANT
SPONGE SURGIFOAM ABS GEL 100 (HEMOSTASIS) IMPLANT
STAPLER SKIN PROX 35W (STAPLE) ×1 IMPLANT
SURGIFLO W/THROMBIN 8M KIT (HEMOSTASIS) ×1 IMPLANT
SUT ETHILON 3 0 PS 1 (SUTURE) IMPLANT
SUT MON AB 2-0 SH27 (SUTURE) ×2 IMPLANT
SUT PDS AB 1 TP1 96 (SUTURE) ×2 IMPLANT
SUT SILK 0 30XBRD TIE 6 (SUTURE) ×1 IMPLANT
SUT SILK 2-0 30XBRD TIE 12 (SUTURE) ×1 IMPLANT
SUT VIC AB 2-0 SH 27X BRD (SUTURE) ×4 IMPLANT
SUT VIC AB 4-0 RB1 27XBRD (SUTURE) ×4 IMPLANT
TAPE UMBILICAL 1/8 X36 TWILL (MISCELLANEOUS) ×1 IMPLANT
TOWEL OR 17X26 10 PK STRL BLUE (TOWEL DISPOSABLE) ×1 IMPLANT
TUBING CONNECTING 10 (TUBING) ×1 IMPLANT
TUBING ENDO SMARTCAP CO2 EXT (MISCELLANEOUS) IMPLANT
URINEMETER 350 (MISCELLANEOUS) ×1 IMPLANT
WATER STERILE IRR 1000ML POUR (IV SOLUTION) ×1 IMPLANT
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
## 2024-01-17 NOTE — Anesthesia Postprocedure Evaluation (Signed)
 Anesthesia Post Note  Patient: Jason [name]  Procedure(s) Performed: DESTRUCTION, LESION, PENIS     Patient location during evaluation: PACU Anesthesia Type: General Level of consciousness: awake and alert Pain management: pain level controlled Vital Signs Assessment: post-procedure vital signs reviewed and stable Respiratory status: spontaneous breathing, nonlabored ventilation, respiratory function stable and patient connected to nasal cannula oxygen Cardiovascular status: blood pressure returned to baseline and stable Postop Assessment: no apparent nausea or vomiting Anesthetic complications: no   No notable events documented.  Last Vitals:  Vitals:   01/17/24 1030 01/17/24 1037  BP: 106/63 102/71  Pulse: 77 (!) 50  Resp: 19 17  Temp: 36.7 C 36.4 C  SpO2: 95% 99%    Last Pain:  Vitals:   01/17/24 1037  TempSrc:   PainSc: 0-No pain                 Epifanio Lamar BRAVO

## 2024-01-17 NOTE — Transfer of Care (Signed)
 Immediate Anesthesia Transfer of Care Note  Patient: Jason [name]  Procedure(s) Performed: DESTRUCTION, LESION, PENIS  Patient Location: PACU  Anesthesia Type:General  Level of Consciousness: drowsy and patient cooperative  Airway & Oxygen Therapy: Patient Spontanous Breathing  Post-op Assessment: Report given to RN and Post -op Vital signs reviewed and stable  Post vital signs: Reviewed and stable  Last Vitals:  Vitals Value Taken Time  BP 124/105 01/17/24 10:00  Temp 36.7 C 01/17/24 09:57  Pulse 68 01/17/24 10:06  Resp 13 01/17/24 10:06  SpO2 94 % 01/17/24 10:06  Vitals shown include unfiled device data.  Last Pain:  Vitals:   01/17/24 1000  TempSrc:   PainSc: 0-No pain         Complications: No notable events documented.

## 2024-01-17 NOTE — Anesthesia Preprocedure Evaluation (Signed)
 Anesthesia Evaluation  Patient identified by MRN, date of birth, ID band Patient awake    Reviewed: Allergy & Precautions, NPO status , Patient's Chart, lab work & pertinent test results  Airway Mallampati: II  TM Distance: >3 FB Neck ROM: Full    Dental  (+) Dental Advisory Given   Pulmonary former smoker   breath sounds clear to auscultation       Cardiovascular negative cardio ROS  Rhythm:Regular Rate:Normal     Neuro/Psych CVA    GI/Hepatic negative GI ROS, Neg liver ROS,,,  Endo/Other  negative endocrine ROS    Renal/GU negative Renal ROS     Musculoskeletal   Abdominal   Peds  Hematology negative hematology ROS (+)   Anesthesia Other Findings   Reproductive/Obstetrics                              Anesthesia Physical Anesthesia Plan  ASA: 2  Anesthesia Plan: General   Post-op Pain Management: Ofirmev  IV (intra-op)*   Induction: Intravenous  PONV Risk Score and Plan: 2 and Dexamethasone , Ondansetron , Treatment may vary due to age or medical condition and Midazolam   Airway Management Planned: LMA  Additional Equipment:   Intra-op Plan:   Post-operative Plan: Extubation in OR  Informed Consent: I have reviewed the patients History and Physical, chart, labs and discussed the procedure including the risks, benefits and alternatives for the proposed anesthesia with the patient or authorized representative who has indicated his/her understanding and acceptance.     Dental advisory given  Plan Discussed with: CRNA  Anesthesia Plan Comments:         Anesthesia Quick Evaluation

## 2024-01-17 NOTE — Discharge Instructions (Signed)
 I am Neosporin or bacitracin  to the penis twice daily.  Okay to start showering but do not take any tub baths or go swimming until completely healed.  No intercourse until completely healed.  Okay to go back to work tomorrow without limitation

## 2024-01-17 NOTE — Anesthesia Procedure Notes (Signed)
 Procedure Name: LMA Insertion Date/Time: 01/17/2024 9:25 AM  Performed by: Nanci Riis, CRNAPre-anesthesia Checklist: Patient identified, Emergency Drugs available, Suction available, Patient being monitored and Timeout performed Patient Re-evaluated:Patient Re-evaluated prior to induction Oxygen Delivery Method: Circle system utilized Preoxygenation: Pre-oxygenation with 100% oxygen Induction Type: IV induction LMA: LMA inserted LMA Size: 4.0 Number of attempts: 1 Placement Confirmation: positive ETCO2 and breath sounds checked- equal and bilateral Tube secured with: Tape Dental Injury: Teeth and Oropharynx as per pre-operative assessment

## 2024-01-17 NOTE — Op Note (Signed)
 Operative Note  Preoperative diagnosis:  1.  Penile condylomas  Postoperative diagnosis: 1.  Penile condylomas  Procedure(s): 1.  CO2 ablation of penile condylomas  Surgeon: Sherwood Edison, MD  Assistants: None  Anesthesia: General  Complications: None immediate  EBL: Minimal  Specimens: 1.  None  Drains/Catheters: 1.  None  Intraoperative findings: Patient had a 5 mm condyloma on the right mid shaft, several 3 to 5 mm condylomas on the distal dorsal shaft and a single condyloma on the left mid shaft.  All condylomas ablated.  Indication: 23 year old male with penile condylomas presents for previously mentioned operation.  Description of procedure:  The patient was identified and consent was obtained.  The patient was taken to the operating room and placed in the supine position.  The patient was placed under general anesthesia.  Perioperative antibiotics were administered. Patient was prepped and draped in a standard sterile fashion and a timeout was performed.  CO2 laser was used to ablate the penile condylomas.  Patient tolerated the procedure well was stable postoperatively.  Plan: Follow-up in 1 month for postoperative check

## 2024-01-18 ENCOUNTER — Encounter (HOSPITAL_COMMUNITY): Payer: Self-pay | Admitting: Urology

## 2024-01-25 DIAGNOSIS — R21 Rash and other nonspecific skin eruption: Secondary | ICD-10-CM | POA: Diagnosis not present

## 2024-04-19 DIAGNOSIS — A630 Anogenital (venereal) warts: Secondary | ICD-10-CM | POA: Diagnosis not present

## 2024-06-23 DIAGNOSIS — A6000 Herpesviral infection of urogenital system, unspecified: Secondary | ICD-10-CM | POA: Diagnosis not present

## 2024-06-23 DIAGNOSIS — Z720 Tobacco use: Secondary | ICD-10-CM | POA: Diagnosis not present

## 2024-06-23 DIAGNOSIS — Z0000 Encounter for general adult medical examination without abnormal findings: Secondary | ICD-10-CM | POA: Diagnosis not present

## 2024-06-23 DIAGNOSIS — F418 Other specified anxiety disorders: Secondary | ICD-10-CM | POA: Diagnosis not present

## 2024-06-23 DIAGNOSIS — Z13220 Encounter for screening for lipoid disorders: Secondary | ICD-10-CM | POA: Diagnosis not present

## 2024-07-03 ENCOUNTER — Emergency Department (HOSPITAL_COMMUNITY)

## 2024-07-03 ENCOUNTER — Emergency Department (HOSPITAL_COMMUNITY)
Admission: EM | Admit: 2024-07-03 | Discharge: 2024-07-03 | Disposition: A | Discharge status: Home / Self Care | Attending: Emergency Medicine | Admitting: Emergency Medicine

## 2024-07-03 ENCOUNTER — Encounter (HOSPITAL_COMMUNITY): Payer: Self-pay

## 2024-07-03 DIAGNOSIS — J101 Influenza due to other identified influenza virus with other respiratory manifestations: Secondary | ICD-10-CM | POA: Diagnosis not present

## 2024-07-03 DIAGNOSIS — R509 Fever, unspecified: Secondary | ICD-10-CM | POA: Diagnosis not present

## 2024-07-03 DIAGNOSIS — Z7982 Long term (current) use of aspirin: Secondary | ICD-10-CM | POA: Diagnosis not present

## 2024-07-03 DIAGNOSIS — R059 Cough, unspecified: Secondary | ICD-10-CM | POA: Diagnosis not present

## 2024-07-03 LAB — GROUP A STREP BY PCR: Group A Strep by PCR: NOT DETECTED

## 2024-07-03 LAB — RESP PANEL BY RT-PCR (RSV, FLU A&B, COVID)  RVPGX2
Influenza A by PCR: POSITIVE — AB
Influenza B by PCR: NEGATIVE
Resp Syncytial Virus by PCR: NEGATIVE
SARS Coronavirus 2 by RT PCR: NEGATIVE

## 2024-07-03 MED ORDER — ONDANSETRON 4 MG PO TBDP
4.0000 mg | ORAL_TABLET | Freq: Once | ORAL | Status: AC
  Administered 2024-07-03: 4 mg via ORAL
  Filled 2024-07-03: qty 1

## 2024-07-03 MED ORDER — ONDANSETRON 4 MG PO TBDP: 4.0000 mg | ORAL_TABLET | Freq: Three times a day (TID) | ORAL | 0 refills | Status: AC | PRN

## 2024-07-03 NOTE — Discharge Instructions (Addendum)
 You have influenza A. You may take over the counter medications as needed for your symptoms. Continue to stay hydrated and return to the ER with any new severe symptoms.

## 2024-07-03 NOTE — ED Provider Notes (Signed)
 "  EMERGENCY DEPARTMENT AT [facility] Provider Note   CSN: [id number] Arrival date & time: 07/03/24  0110     Patient presents with: Cough   Ahmadou Bolz D'Arata is a 23 y.o. male presents with less than 24 hours of flulike symptoms taking DayQuil at home with some improvement.  No known ill contacts.  Daily baby aspirin  of 81 milligrams which she has taken today.   HPI     Prior to Admission medications  Medication Sig Start Date End Date Taking? Authorizing Provider  aspirin  81 MG chewable tablet Chew 1 tablet (81 mg total) by mouth daily. 07/18/21   Matthews, Kacie Sue-Ellen, PA  valACYclovir  (VALTREX ) 1000 MG tablet Take 1,000 mg by mouth 2 (two) times daily as needed (outbreaks).    [provider]    Allergies: Ibuprofen    Review of Systems  Constitutional:  Positive for activity change, appetite change, chills, fatigue and fever.  HENT:  Positive for congestion and sore throat. Negative for trouble swallowing and voice change.   Eyes: Negative.   Respiratory:  Positive for cough. Negative for shortness of breath.   Cardiovascular: Negative.   Gastrointestinal:  Positive for nausea. Negative for abdominal pain and vomiting.  Musculoskeletal:  Positive for myalgias.  Skin:  Negative for rash.  Neurological:  Positive for headaches. Negative for light-headedness.    Updated Vital Signs BP 132/64 (BP Location: Left Arm)   Pulse (!) 123   Temp 99.5 F (37.5 C)   Resp 14   Ht 5' 11 (1.803 m)   Wt 90.7 kg   SpO2 97%   BMI 27.89 kg/m   Physical Exam Vitals and nursing note reviewed.  Constitutional:      Appearance: He is not ill-appearing or toxic-appearing.  HENT:     Head: Normocephalic and atraumatic.     Nose: Congestion and rhinorrhea present.     Mouth/Throat:     Mouth: Mucous membranes are moist.     Pharynx: Oropharynx is clear. Uvula midline. Posterior oropharyngeal erythema present. No oropharyngeal exudate.      Tonsils: No tonsillar exudate or tonsillar abscesses.  Eyes:     General:        Right eye: No discharge.        Left eye: No discharge.     Extraocular Movements: Extraocular movements intact.     Conjunctiva/sclera: Conjunctivae normal.     Pupils: Pupils are equal, round, and reactive to light.  Cardiovascular:     Rate and Rhythm: Normal rate and regular rhythm.     Pulses: Normal pulses.     Heart sounds: Normal heart sounds. No murmur heard. Pulmonary:     Effort: Pulmonary effort is normal. No respiratory distress.     Breath sounds: Normal breath sounds. No wheezing or rales.  Abdominal:     General: Bowel sounds are normal. There is no distension.     Palpations: Abdomen is soft.     Tenderness: There is no abdominal tenderness.  Musculoskeletal:        General: No deformity.     Cervical back: Neck supple.     Right lower leg: No edema.     Left lower leg: No edema.  Skin:    General: Skin is warm and dry.     Capillary Refill: Capillary refill takes less than 2 seconds.  Neurological:     General: No focal deficit present.     Mental Status: He is alert  and oriented to person, place, and time. Mental status is at baseline.  Psychiatric:        Mood and Affect: Mood normal.     (all labs ordered are listed, but only abnormal results are displayed) Labs Reviewed  RESP PANEL BY RT-PCR (RSV, FLU A&B, COVID)  [id number] - Abnormal; Notable for the following components:      Result Value   Influenza A by PCR POSITIVE (*)    All other components within normal limits  GROUP A STREP BY PCR    EKG: None  Radiology: DG Chest 2 View Result Date: 07/03/2024 CLINICAL DATA:  Cough and fevers EXAM: CHEST - 2 VIEW COMPARISON:  09/13/2023 FINDINGS: The heart size and mediastinal contours are within normal limits. Both lungs are clear. The visualized skeletal structures are unremarkable. IMPRESSION: No active cardiopulmonary disease. Electronically Signed   By: Oneil Devonshire M.D.    On: 07/03/2024 03:05     Procedures   Medications Ordered in the ED - No data to display                                  Medical Decision Making 23 year old male with flulike symptoms.  Borderline febrile and tachycardic on intake.  Tachycardia resolved at time of my evaluation.  Cardiopulmonary abdominal Sam benign.  Patient with congestion, posterior pharyngeal erythema but otherwise well-appearing.  Amount and/or Complexity of Data Reviewed Labs:     Details: Strep test negative, influenza A positive Radiology:     Details: Chest x-ray negative for acute cardiopulmonary disease.  Risk Prescription drug management.   Clinical picture most consistent with acute viral illness secondary to influenza A as diagnosed today.  Clinical concern for emergent underlying condition that would warrant further ED workup and patient management is exceedingly low.  Xander voiced understanding of his medical evaluation and treatment plan. Each of their questions answered to their expressed satisfaction.  Return precautions were given.  Patient is well-appearing, stable, and was discharged in good condition.  This chart was dictated using voice recognition software, Dragon. Despite the best efforts of this provider to proofread and correct errors, errors may still occur which can change documentation meaning.      Final diagnoses:  None    ED Discharge Orders     None          Bobette Pleasant JONELLE DEVONNA 07/03/24 0439    Melvenia Motto, MD 07/03/24 0700  "

## 2024-07-03 NOTE — ED Triage Notes (Signed)
 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 Reports headache, cough, fever for 6 hours . Reports taking Dayquil with no relief. Reports difficulty using the bathroom.
